# Patient Record
Sex: Female | Born: 1950 | Race: Black or African American | Hispanic: No | Marital: Married | Smoking: Never smoker
Health system: Southern US, Community
[De-identification: ages and names within clinical notes are randomized; demographics above are authoritative.]

## PROBLEM LIST (undated history)

## (undated) DIAGNOSIS — F32A Depression, unspecified: Secondary | ICD-10-CM

## (undated) DIAGNOSIS — K219 Gastro-esophageal reflux disease without esophagitis: Secondary | ICD-10-CM

## (undated) DIAGNOSIS — M1990 Unspecified osteoarthritis, unspecified site: Secondary | ICD-10-CM

## (undated) DIAGNOSIS — F419 Anxiety disorder, unspecified: Secondary | ICD-10-CM

## (undated) DIAGNOSIS — R519 Headache, unspecified: Secondary | ICD-10-CM

## (undated) DIAGNOSIS — H409 Unspecified glaucoma: Secondary | ICD-10-CM

## (undated) DIAGNOSIS — I10 Essential (primary) hypertension: Secondary | ICD-10-CM

## (undated) DIAGNOSIS — F329 Major depressive disorder, single episode, unspecified: Secondary | ICD-10-CM

## (undated) DIAGNOSIS — D649 Anemia, unspecified: Secondary | ICD-10-CM

## (undated) DIAGNOSIS — R51 Headache: Secondary | ICD-10-CM

## (undated) HISTORY — DX: Essential (primary) hypertension: I10

## (undated) HISTORY — PX: ROTATOR CUFF REPAIR: SHX139

---

## 1989-07-17 HISTORY — PX: CARPAL TUNNEL RELEASE: SHX101

## 1999-09-11 ENCOUNTER — Other Ambulatory Visit: Admission: RE | Admit: 1999-09-11 | Discharge: 1999-09-11 | Payer: Self-pay | Admitting: *Deleted

## 2000-01-01 ENCOUNTER — Ambulatory Visit (HOSPITAL_COMMUNITY): Admission: RE | Admit: 2000-01-01 | Discharge: 2000-01-01 | Payer: Self-pay

## 2000-11-11 ENCOUNTER — Other Ambulatory Visit: Admission: RE | Admit: 2000-11-11 | Discharge: 2000-11-11 | Payer: Self-pay | Admitting: *Deleted

## 2001-12-29 ENCOUNTER — Other Ambulatory Visit: Admission: RE | Admit: 2001-12-29 | Discharge: 2001-12-29 | Payer: Self-pay | Admitting: *Deleted

## 2003-01-01 ENCOUNTER — Other Ambulatory Visit: Admission: RE | Admit: 2003-01-01 | Discharge: 2003-01-01 | Payer: Self-pay | Admitting: *Deleted

## 2003-07-12 ENCOUNTER — Encounter (INDEPENDENT_AMBULATORY_CARE_PROVIDER_SITE_OTHER): Payer: Self-pay | Admitting: Specialist

## 2003-07-12 ENCOUNTER — Ambulatory Visit (HOSPITAL_COMMUNITY): Admission: RE | Admit: 2003-07-12 | Discharge: 2003-07-12 | Payer: Self-pay | Admitting: *Deleted

## 2003-12-28 ENCOUNTER — Encounter: Admission: RE | Admit: 2003-12-28 | Discharge: 2003-12-28 | Payer: Self-pay | Admitting: Endocrinology

## 2004-01-20 ENCOUNTER — Encounter: Admission: RE | Admit: 2004-01-20 | Discharge: 2004-01-20 | Payer: Self-pay | Admitting: Internal Medicine

## 2004-01-23 ENCOUNTER — Other Ambulatory Visit: Admission: RE | Admit: 2004-01-23 | Discharge: 2004-01-23 | Payer: Self-pay | Admitting: *Deleted

## 2004-03-23 ENCOUNTER — Emergency Department (HOSPITAL_COMMUNITY): Admission: EM | Admit: 2004-03-23 | Discharge: 2004-03-23 | Payer: Self-pay | Admitting: Emergency Medicine

## 2004-05-20 ENCOUNTER — Encounter: Admission: RE | Admit: 2004-05-20 | Discharge: 2004-05-20 | Payer: Self-pay

## 2004-06-06 ENCOUNTER — Encounter: Admission: RE | Admit: 2004-06-06 | Discharge: 2004-06-06 | Payer: Self-pay

## 2004-06-19 ENCOUNTER — Encounter: Admission: RE | Admit: 2004-06-19 | Discharge: 2004-06-19 | Payer: Self-pay

## 2004-10-27 ENCOUNTER — Encounter (INDEPENDENT_AMBULATORY_CARE_PROVIDER_SITE_OTHER): Payer: Self-pay | Admitting: *Deleted

## 2004-10-27 ENCOUNTER — Ambulatory Visit (HOSPITAL_BASED_OUTPATIENT_CLINIC_OR_DEPARTMENT_OTHER): Admission: RE | Admit: 2004-10-27 | Discharge: 2004-10-27 | Payer: Self-pay | Admitting: General Surgery

## 2004-10-27 ENCOUNTER — Ambulatory Visit (HOSPITAL_COMMUNITY): Admission: RE | Admit: 2004-10-27 | Discharge: 2004-10-27 | Payer: Self-pay | Admitting: General Surgery

## 2005-02-18 ENCOUNTER — Other Ambulatory Visit: Admission: RE | Admit: 2005-02-18 | Discharge: 2005-02-18 | Payer: Self-pay | Admitting: *Deleted

## 2006-06-23 ENCOUNTER — Other Ambulatory Visit: Admission: RE | Admit: 2006-06-23 | Discharge: 2006-06-23 | Payer: Self-pay | Admitting: *Deleted

## 2007-07-11 ENCOUNTER — Other Ambulatory Visit: Admission: RE | Admit: 2007-07-11 | Discharge: 2007-07-11 | Payer: Self-pay | Admitting: *Deleted

## 2008-08-30 ENCOUNTER — Other Ambulatory Visit: Admission: RE | Admit: 2008-08-30 | Discharge: 2008-08-30 | Payer: Self-pay | Admitting: Gynecology

## 2008-11-04 ENCOUNTER — Encounter: Admission: RE | Admit: 2008-11-04 | Discharge: 2008-11-04 | Payer: Self-pay | Admitting: Internal Medicine

## 2008-11-13 ENCOUNTER — Encounter: Admission: RE | Admit: 2008-11-13 | Discharge: 2008-11-13 | Payer: Self-pay | Admitting: Internal Medicine

## 2009-01-02 ENCOUNTER — Encounter: Admission: RE | Admit: 2009-01-02 | Discharge: 2009-02-20 | Payer: Self-pay | Admitting: Rheumatology

## 2010-02-07 ENCOUNTER — Encounter: Admission: RE | Admit: 2010-02-07 | Discharge: 2010-02-07 | Payer: Self-pay | Admitting: Internal Medicine

## 2010-04-23 ENCOUNTER — Encounter: Admission: RE | Admit: 2010-04-23 | Discharge: 2010-04-23 | Payer: Self-pay | Admitting: Internal Medicine

## 2010-07-07 ENCOUNTER — Encounter: Admission: RE | Admit: 2010-07-07 | Discharge: 2010-07-07 | Payer: Self-pay | Admitting: Internal Medicine

## 2010-12-07 ENCOUNTER — Encounter: Payer: Self-pay | Admitting: Internal Medicine

## 2011-04-03 NOTE — Op Note (Signed)
   NAME:  Alyssa Valentine, Alyssa Valentine                         ACCOUNT NO.:  [id number]   MEDICAL RECORD NO.:  [id number]                   PATIENT TYPE:  AMB   LOCATION:  ENDO                                 FACILITY:  [facility]   PHYSICIAN:  Georgiana Spinner, M.D.                 DATE OF BIRTH:  [birth year]   DATE OF PROCEDURE:  07/12/2003  DATE OF DISCHARGE:                                 OPERATIVE REPORT   PROCEDURE:  Upper endoscopy.   INDICATIONS:  GERD.   ANESTHESIA:  Demerol 50 mg, Versed 7 mg.   DESCRIPTION OF PROCEDURE:  With the patient mildly sedated in the left  lateral decubitus position, the Olympus videoscopic endoscope was inserted  in the mouth, passed under direct vision through the esophagus, which  appeared normal, into the stomach.  The fundus, body, antrum, duodenal bulb,  second portion of the duodenum all appeared normal.  From this point the  endoscope was slowly withdrawn, taking circumferential views of the duodenal  mucosa until the endoscope had been pulled back into the stomach, placed in  retroflexion to view the stomach from below.  The endoscope was straightened  and withdrawn, taking circumferential views of the remaining gastric and  esophageal mucosa.  The patient's vital signs and pulse oximetry remained  stable.  The patient tolerated the procedure well without apparent  complications.   FINDINGS:  Unremarkable examination.   PLAN:  Proceed to colonoscopy.                                               Georgiana Spinner, M.D.    GMO/MEDQ  D:  07/12/2003  T:  07/12/2003  Job:  [id number]

## 2011-04-03 NOTE — Op Note (Signed)
   NAME:  Alyssa Valentine, Alyssa Valentine                         ACCOUNT NO.:  [id number]   MEDICAL RECORD NO.:  [id number]                   PATIENT TYPE:  AMB   LOCATION:  ENDO                                 FACILITY:  [facility]   PHYSICIAN:  Georgiana Spinner, M.D.                 DATE OF BIRTH:  October 25, 1951   DATE OF PROCEDURE:  07/12/2003  DATE OF DISCHARGE:                                 OPERATIVE REPORT   PROCEDURE:  Colonoscopy with biopsy and polypectomy.   INDICATIONS:  Colon polyp.   ANESTHESIA:  Demerol 10 mg.   DESCRIPTION OF PROCEDURE:  With the patient mildly sedated in the left  lateral decubitus position, the Olympus videoscopic colonoscope was inserted  in the rectum, passed under direct vision to the cecum, identified by the  ileocecal valve and appendiceal orifice, both of which were photographed.  From this point the colonoscope was slowly withdrawn, taking circumferential  views of the colonic mucosa, stopping in the descending colon near the  splenic flexure, where a small polyp was seen and removed using hot biopsy  forceps technique, a setting of 20-20 blended current, and subsequently at  approximately 20 cm from the anal verge, a large polyp seen on a stalk was  noted.  It was photographed and it was removed using snare cautery technique  by encircling the stalk.  There was good hemostasis, and the polyp was  removed and obtained for pathology.  The endoscope was reinserted to this  level and slowly withdrawn taking circumferential views of the remaining  colonic mucosa, stopping in the rectum, which appeared normal on direct and  showed hemorrhoids on retroflexed view.  The endoscope was straightened and  withdrawn.  The patient's vital signs and pulse oximetry remained stable.  The patient tolerated the procedure well without apparent complication.   FINDINGS:  Polyps as described above in the splenic flexure and sigmoid  colon.   Await biopsy report.  The patient will  call me for results and follow up  with me as an outpatient.                                               Georgiana Spinner, M.D.    GMO/MEDQ  D:  07/12/2003  T:  07/12/2003  Job:  [id number]

## 2011-04-03 NOTE — Op Note (Signed)
[name], Alyssa Valentine               ACCOUNT NO.:  [id number]   MEDICAL RECORD NO.:  [id number]          PATIENT TYPE:  AMB   LOCATION:  DSC                          FACILITY:  [facility]   PHYSICIAN:  Angelia Mould. Derrell Lolling, M.D.DATE OF BIRTH:  July 12, 1951   DATE OF PROCEDURE:  10/27/2004  DATE OF DISCHARGE:                                 OPERATIVE REPORT   PREOPERATIVE DIAGNOSIS:  Muscle pain and myositis.   OPERATION PERFORMED:  Left thigh muscle biopsy.   SURGEON:  Angelia Mould. Derrell Lolling, M.D.   OPERATIVE INDICATION:  This is a 60 year old black female who has a 1-year  history of back pain and progressive leg pain, but minimal weakness.  She  has been evaluated by Dr. Lacretia Nicks. Danella Maiers.  Elevated CPK was noted.  Dr. Phylliss Bob  requested a thigh muscle biopsy.   OPERATIVE TECHNIQUE:  The patient was brought to [facility] and  brought to a regular operating room.  She was monitored and sedated by the  anesthesia department.  She was placed supine on the operating table.  The  left upper thigh was prepped and draped in a sterile fashion.  Xylocaine 1%  with epinephrine was used as a local infiltration anesthetic.  A vertically  oriented, slightly oblique incision was made in the upper anterolateral  thigh.  Dissection was carried down through the subcutaneous tissue.  The  deep muscle fascia was incised.  About a 1-1/2- to 2-inch segment of thigh  muscle, presumably the quadriceps muscle, was isolated, clamped proximally  and distally, and amputated.  This was fixed onto a tongue blade with  needles and sent fresh to the lab.  Requests were made for routine histology  as well as electron microscopy studies at [facility].   The cut ends of the muscle were ligated with 2-0 Vicryl ties.  There was no  bleeding.  Subcutaneous tissue was closed with interrupted sutures of 3-0  Vicryl and the skin closed with a running subcuticular suture of 4-0 Vicryl  and Steri-Strips.  Clean bandages and the  patient taken to recovery room in  stable condition.   ESTIMATED BLOOD LOSS:  Estimated blood loss was about 5 mL.   COMPLICATIONS:  None.    Sponge and instruments were correct.      Hayw   HMI/MEDQ  D:  10/27/2004  T:  10/27/2004  Job:  [id number]   cc:   Areatha Keas, M.D.  [address]  [address]  [place]  [place] [address]  Fax: [phone]

## 2011-09-24 ENCOUNTER — Other Ambulatory Visit: Payer: Self-pay | Admitting: Internal Medicine

## 2011-09-24 DIAGNOSIS — R519 Headache, unspecified: Secondary | ICD-10-CM

## 2011-10-01 ENCOUNTER — Ambulatory Visit
Admission: RE | Admit: 2011-10-01 | Discharge: 2011-10-01 | Disposition: A | Discharge status: Home / Self Care | Payer: Federal, State, Local not specified - PPO | Source: Ambulatory Visit | Attending: Internal Medicine | Admitting: Internal Medicine

## 2011-10-01 DIAGNOSIS — R519 Headache, unspecified: Secondary | ICD-10-CM

## 2012-02-24 ENCOUNTER — Ambulatory Visit (INDEPENDENT_AMBULATORY_CARE_PROVIDER_SITE_OTHER): Payer: Federal, State, Local not specified - PPO | Admitting: Physician Assistant

## 2012-02-24 VITALS — BP 117/76 | HR 94 | Temp 97.8°F | Resp 16 | Ht 60.5 in | Wt 157.8 lb

## 2012-02-24 DIAGNOSIS — Z111 Encounter for screening for respiratory tuberculosis: Secondary | ICD-10-CM

## 2012-02-24 DIAGNOSIS — I10 Essential (primary) hypertension: Secondary | ICD-10-CM

## 2012-02-24 NOTE — Progress Notes (Signed)
   Patient ID: Alyssa Valentine MRN: [id number], DOB: [birth year], 61 y.o. Date of Encounter: 02/24/2012, 4:38 PM  Primary Physician: No primary provider on file.  Chief Complaint: Needs PPD  HPI: 61 y.o. year old female with history below presents for PPD. Needs PPD for new job. 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 Asymptomatic. She is otherwise doing well without issues or complaints.   Past Medical History  Diagnosis Date  . HTN (hypertension)      Home Meds: Prior to Admission medications   Medication Sig Start Date End Date Taking? Authorizing Provider  aspirin 81 MG tablet Take 81 mg by mouth daily.   Yes Historical Provider, MD  Benazepril HCl (LOTENSIN PO) Take by mouth daily.   Yes Historical Provider, MD  CALCIUM-VITAMIN D PO Take by mouth daily.   Yes Historical Provider, MD  DEXLANSOPRAZOLE PO Take by mouth daily.   Yes Historical Provider, MD  Fish Oil OIL by Does not apply route daily.   Yes Historical Provider, MD  Potassium (POTASSIMIN PO) Take by mouth.   Yes Historical Provider, MD    Allergies:  Allergies  Allergen Reactions  . Sulfa Antibiotics Swelling    ITCHING    History   Social History  . Marital Status: Married    Spouse Name: N/A    Number of Children: N/A  . Years of Education: N/A   Occupational History  . Not on file.   Social History Main Topics  . Smoking status: Never Smoker   . Smokeless tobacco: Not on file  . Alcohol Use: No  . Drug Use: Not on file  . Sexually Active: Not on file   Other Topics Concern  . Not on file   Social History Narrative  . No narrative on file     Review of Systems: Constitutional: negative for chills, fever, night sweats, weight changes, or fatigue  HEENT: negative for vision changes, hearing loss, congestion, rhinorrhea, ST, epistaxis, or sinus pressure Cardiovascular: negative for chest pain or  palpitations Respiratory: negative for hemoptysis, wheezing, shortness of breath, or cough Abdominal: negative for abdominal pain, nausea, vomiting, diarrhea, or constipation Dermatological: negative for rash Neurologic: negative for headache, dizziness, or syncope All other systems reviewed and are otherwise negative with the exception to those above and in the HPI.   Physical Exam: Blood pressure 117/76, pulse 94, temperature 97.8 F (36.6 C), temperature source Oral, resp. rate 16, height 5' 0.5" (1.537 m), weight 157 lb 12.8 oz (71.578 kg)., Body mass index is 30.31 kg/(m^2). General: Well developed, well nourished, in no acute distress. Head: Normocephalic, atraumatic, eyes without discharge, sclera non-icteric, nares are without discharge.  Neck: Supple. No thyromegaly. Full ROM. No lymphadenopathy. Lungs: Clear bilaterally to auscultation without wheezes, rales, or rhonchi. Breathing is unlabored. Heart: RRR with S1 S2. No murmurs, rubs, or gallops appreciated. Msk:  Strength and tone normal for age. Extremities/Skin: Warm and dry. No clubbing or cyanosis. No edema. No rashes or suspicious lesions. Neuro: Alert and oriented X 3. Moves all extremities spontaneously. Gait is normal. CNII-XII grossly in tact. Psych:  Responds to questions appropriately with a normal affect.     ASSESSMENT AND PLAN:  61 y.o. year old female here for PPD -PPD placed -RTC 48-72 hours for reading  Signed, Eula Listen, PA-C 02/24/2012 4:38 PM

## 2012-04-10 LAB — TB SKIN TEST

## 2012-08-18 ENCOUNTER — Other Ambulatory Visit: Payer: Self-pay | Admitting: Internal Medicine

## 2012-08-18 DIAGNOSIS — M545 Low back pain, unspecified: Secondary | ICD-10-CM

## 2012-08-22 ENCOUNTER — Inpatient Hospital Stay: Admission: RE | Admit: 2012-08-22 | Payer: Federal, State, Local not specified - PPO | Source: Ambulatory Visit

## 2012-08-29 ENCOUNTER — Ambulatory Visit
Admission: RE | Admit: 2012-08-29 | Discharge: 2012-08-29 | Disposition: A | Discharge status: Home / Self Care | Payer: Federal, State, Local not specified - PPO | Source: Ambulatory Visit | Attending: Internal Medicine | Admitting: Internal Medicine

## 2012-08-29 DIAGNOSIS — M545 Low back pain, unspecified: Secondary | ICD-10-CM

## 2012-09-09 ENCOUNTER — Other Ambulatory Visit: Payer: Self-pay | Admitting: Internal Medicine

## 2012-09-09 DIAGNOSIS — M549 Dorsalgia, unspecified: Secondary | ICD-10-CM

## 2012-09-15 ENCOUNTER — Ambulatory Visit
Admission: RE | Admit: 2012-09-15 | Discharge: 2012-09-15 | Disposition: A | Discharge status: Home / Self Care | Payer: Federal, State, Local not specified - PPO | Source: Ambulatory Visit | Attending: Internal Medicine | Admitting: Internal Medicine

## 2012-09-15 DIAGNOSIS — M549 Dorsalgia, unspecified: Secondary | ICD-10-CM

## 2012-09-15 MED ORDER — IOHEXOL 180 MG/ML  SOLN
1.0000 mL | Freq: Once | INTRAMUSCULAR | Status: AC | PRN
  Administered 2012-09-15: 1 mL via EPIDURAL

## 2012-09-15 MED ORDER — METHYLPREDNISOLONE ACETATE 40 MG/ML INJ SUSP (RADIOLOG
120.0000 mg | Freq: Once | INTRAMUSCULAR | Status: AC
  Administered 2012-09-15: 120 mg via EPIDURAL

## 2013-03-17 ENCOUNTER — Other Ambulatory Visit: Payer: Self-pay | Admitting: Internal Medicine

## 2013-03-17 DIAGNOSIS — M549 Dorsalgia, unspecified: Secondary | ICD-10-CM

## 2013-03-21 ENCOUNTER — Ambulatory Visit
Admission: RE | Admit: 2013-03-21 | Discharge: 2013-03-21 | Disposition: A | Discharge status: Home / Self Care | Payer: Federal, State, Local not specified - PPO | Source: Ambulatory Visit | Attending: Internal Medicine | Admitting: Internal Medicine

## 2013-03-21 DIAGNOSIS — M549 Dorsalgia, unspecified: Secondary | ICD-10-CM

## 2013-03-21 MED ORDER — METHYLPREDNISOLONE ACETATE 40 MG/ML INJ SUSP (RADIOLOG
120.0000 mg | Freq: Once | INTRAMUSCULAR | Status: AC
  Administered 2013-03-21: 120 mg via EPIDURAL

## 2013-03-21 MED ORDER — IOHEXOL 180 MG/ML  SOLN
1.0000 mL | Freq: Once | INTRAMUSCULAR | Status: AC | PRN
  Administered 2013-03-21: 1 mL via EPIDURAL

## 2013-05-27 IMAGING — CT CT HEAD W/O CM
2 series · 16 of 30 positions shown, 20 images · non-contrast
Comparison: None.

CLINICAL DATA: 60-year-old female with headache, memory loss,
dizziness, confusion, speech abnormality.

CT HEAD WITHOUT CONTRAST
TECHNIQUE: Contiguous axial images were obtained from the base of
the skull through the vertex without contrast.

[Series 2: head w/o · axial · non-contrast · 0.43mm/px · z∈[+12,+136]mm · 13 of 28 slices shown, 17 images]
[im 2/28  brain]
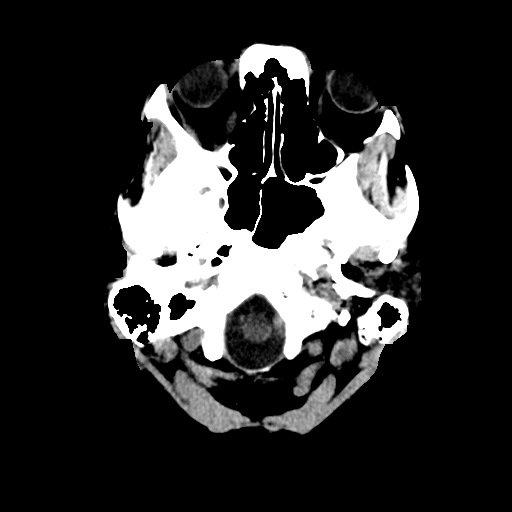
[im 2/28  bone]
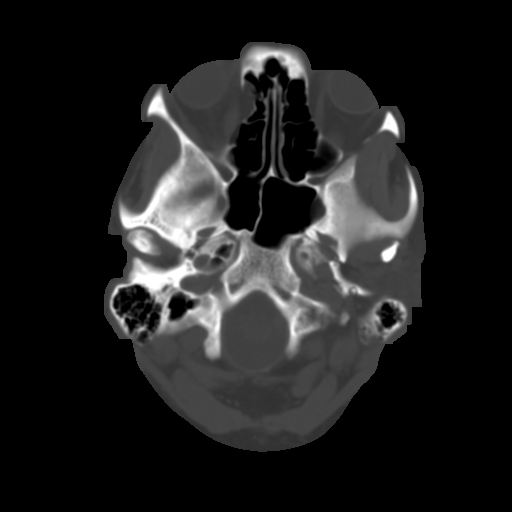
[im 4/28  brain]
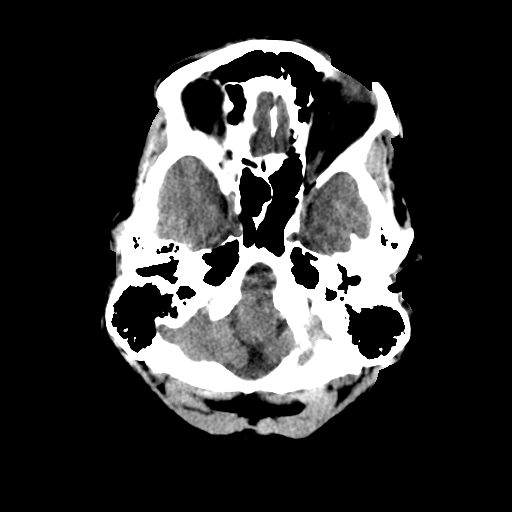
[im 6/28  brain]
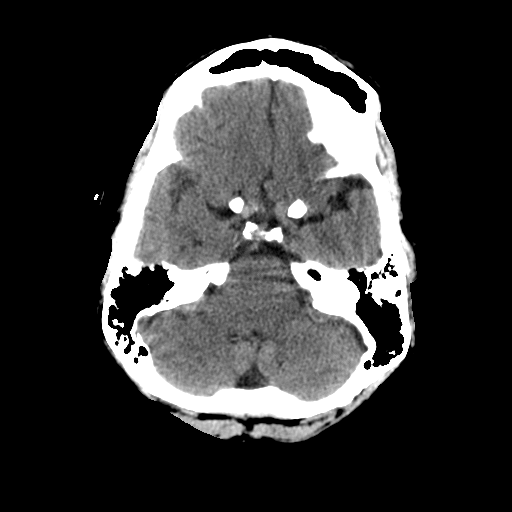
[im 8/28  brain]
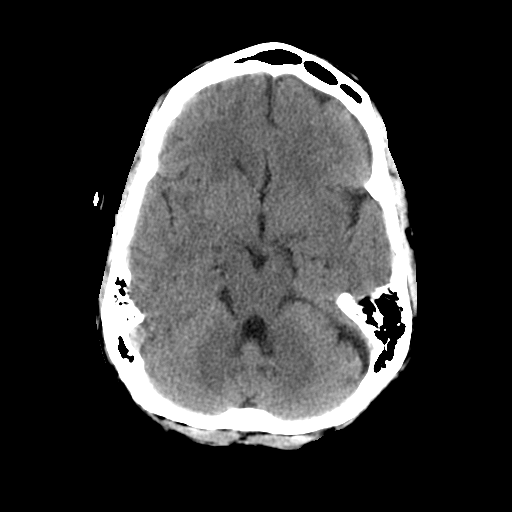
[im 10/28  brain]
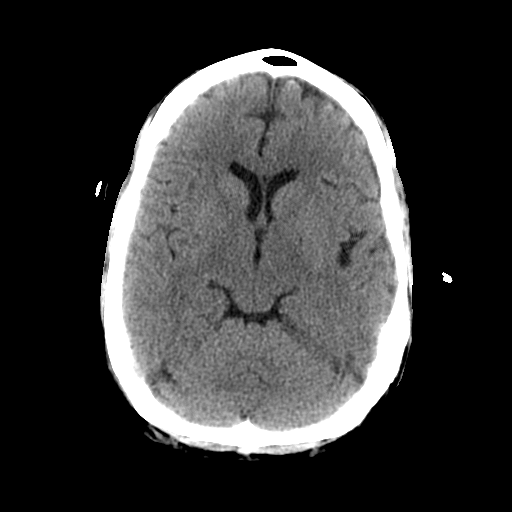
[im 10/28  bone]
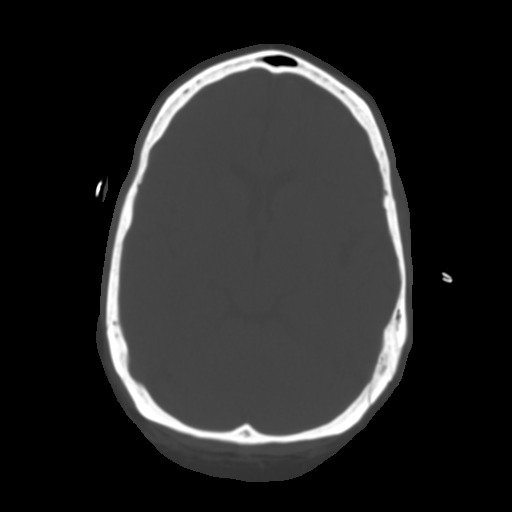
[im 12/28  brain]
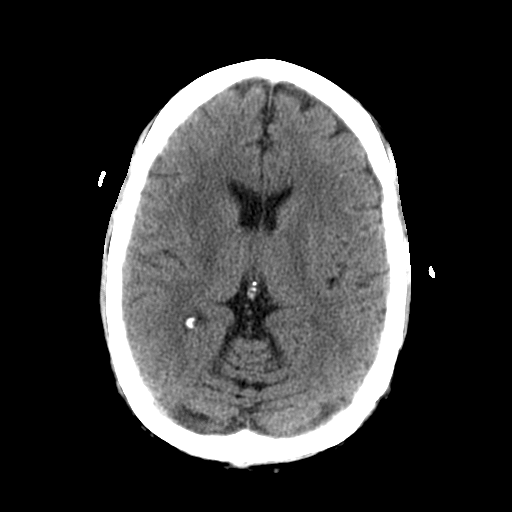
[im 14/28  brain]
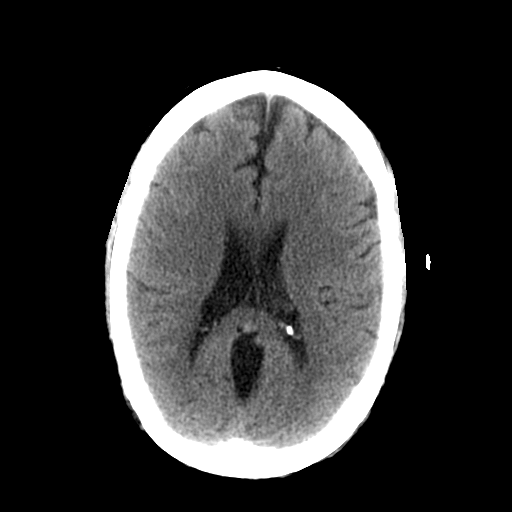
[im 16/28  brain]
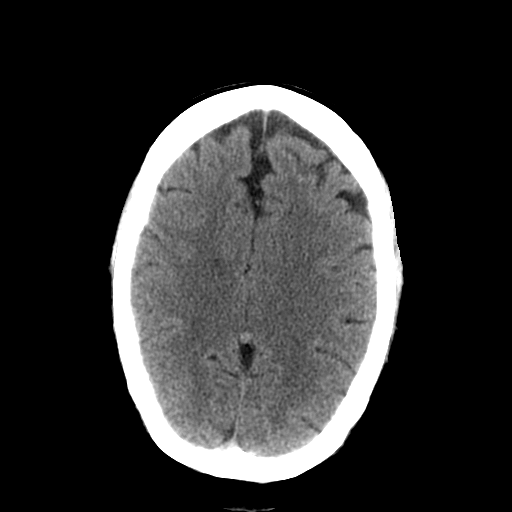
[im 18/28  brain]
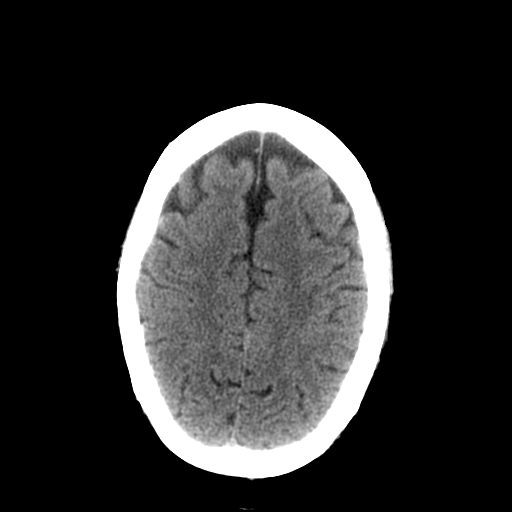
[im 18/28  bone]
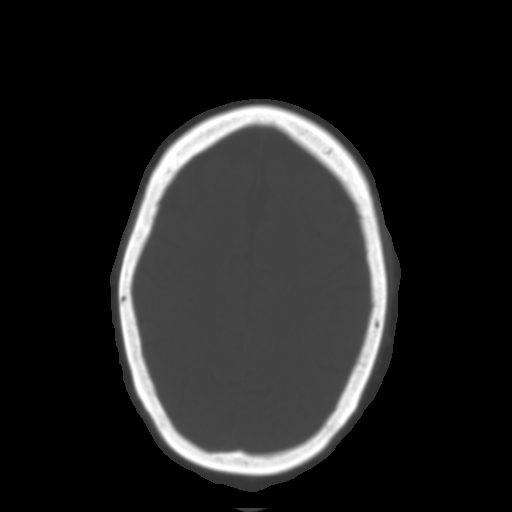
[im 20/28  brain]
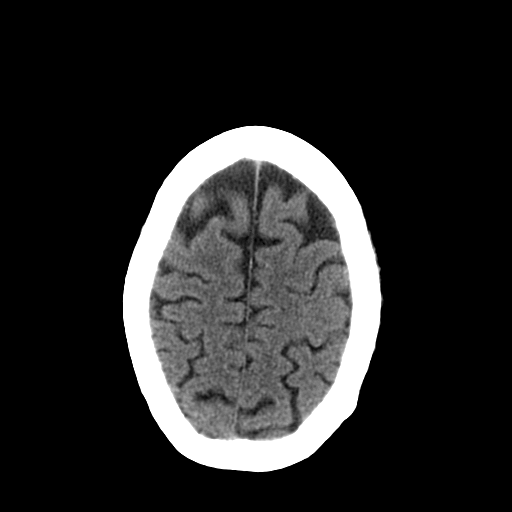
[im 22/28  brain]
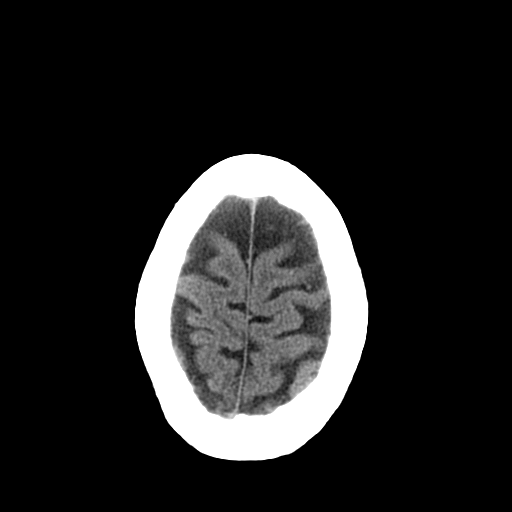
[im 24/28  brain]
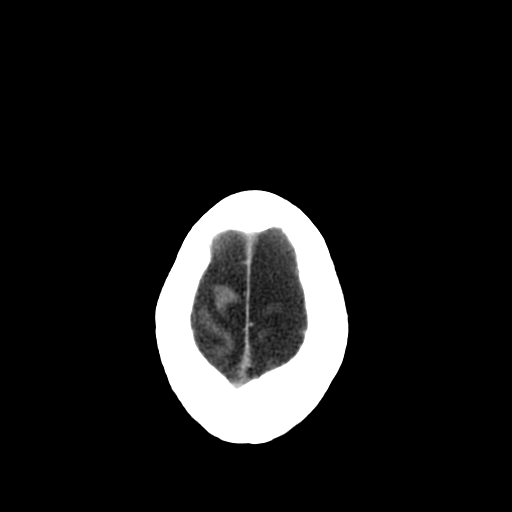
[im 26/28  brain]
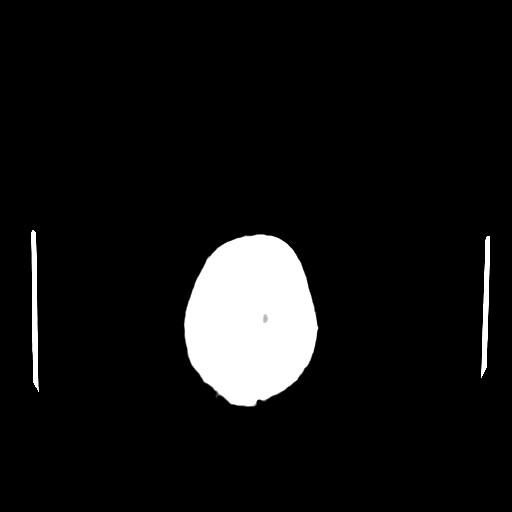
[im 26/28  bone]
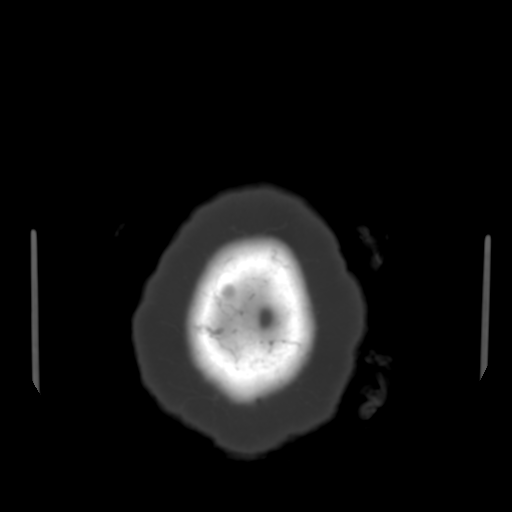

[Series 3: head bone · axial · 0.43mm/px · z∈[+12,+54]mm · 3 of 28 slices shown]
[im 2/28  bone]
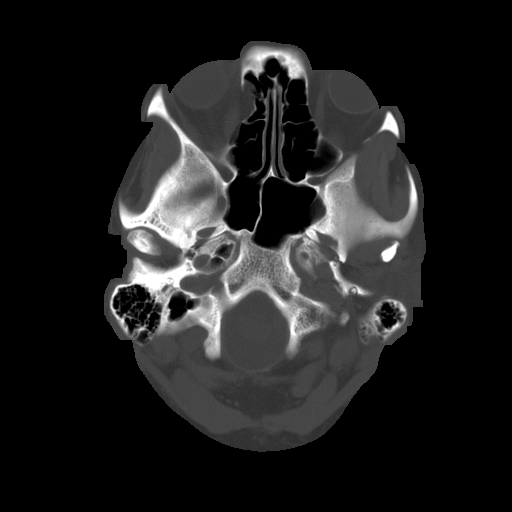
[im 6/28  bone]
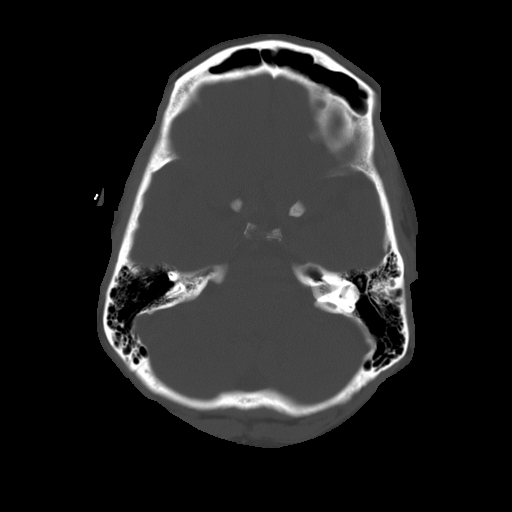
[im 10/28  bone]
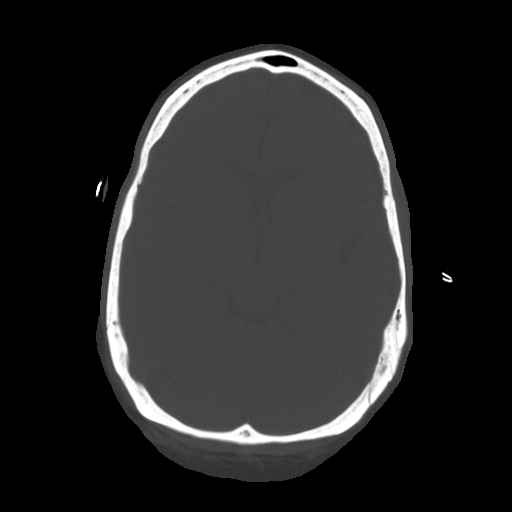

[16 of 30 positions shown; findings below may reference images not displayed]

FINDINGS: Visualized orbit soft tissues are within normal limits.
Visualized scalp soft tissues are within normal limits.  Visualized
paranasal sinuses and mastoids are clear.  No acute osseous
abnormality identified.

Occasional subtle cerebral white matter hypodensity. Cerebral
volume is within normal limits for age.  No midline shift,
ventriculomegaly, mass effect, evidence of mass lesion,
intracranial hemorrhage or evidence of cortically based acute
infarction. No suspicious intracranial vascular hyperdensity.
IMPRESSION: Negative for age noncontrast CT appearance of the brain.

## 2013-08-30 ENCOUNTER — Other Ambulatory Visit: Payer: Self-pay | Admitting: Gastroenterology

## 2013-09-04 ENCOUNTER — Encounter (HOSPITAL_COMMUNITY): Payer: Self-pay | Admitting: *Deleted

## 2013-09-05 ENCOUNTER — Encounter (HOSPITAL_COMMUNITY): Payer: Self-pay | Admitting: Pharmacy Technician

## 2013-09-07 ENCOUNTER — Encounter (HOSPITAL_COMMUNITY): Payer: Self-pay | Admitting: Pharmacy Technician

## 2013-09-15 ENCOUNTER — Encounter (HOSPITAL_COMMUNITY): Payer: Self-pay | Admitting: *Deleted

## 2013-09-15 ENCOUNTER — Encounter (HOSPITAL_COMMUNITY): Payer: Federal, State, Local not specified - PPO | Admitting: Anesthesiology

## 2013-09-15 ENCOUNTER — Encounter (HOSPITAL_COMMUNITY)
Admission: RE | Disposition: A | Discharge status: Home / Self Care | Payer: Self-pay | Source: Ambulatory Visit | Attending: Gastroenterology

## 2013-09-15 ENCOUNTER — Ambulatory Visit (HOSPITAL_COMMUNITY): Payer: Federal, State, Local not specified - PPO | Admitting: Anesthesiology

## 2013-09-15 ENCOUNTER — Ambulatory Visit (HOSPITAL_COMMUNITY)
Admission: RE | Admit: 2013-09-15 | Discharge: 2013-09-15 | Disposition: A | Discharge status: Home / Self Care | Payer: Federal, State, Local not specified - PPO | Source: Ambulatory Visit | Attending: Gastroenterology | Admitting: Gastroenterology

## 2013-09-15 DIAGNOSIS — D509 Iron deficiency anemia, unspecified: Secondary | ICD-10-CM | POA: Insufficient documentation

## 2013-09-15 DIAGNOSIS — K2970 Gastritis, unspecified, without bleeding: Secondary | ICD-10-CM | POA: Insufficient documentation

## 2013-09-15 DIAGNOSIS — K5730 Diverticulosis of large intestine without perforation or abscess without bleeding: Secondary | ICD-10-CM | POA: Insufficient documentation

## 2013-09-15 DIAGNOSIS — K219 Gastro-esophageal reflux disease without esophagitis: Secondary | ICD-10-CM | POA: Insufficient documentation

## 2013-09-15 DIAGNOSIS — K259 Gastric ulcer, unspecified as acute or chronic, without hemorrhage or perforation: Secondary | ICD-10-CM | POA: Insufficient documentation

## 2013-09-15 DIAGNOSIS — K644 Residual hemorrhoidal skin tags: Secondary | ICD-10-CM | POA: Insufficient documentation

## 2013-09-15 DIAGNOSIS — K449 Diaphragmatic hernia without obstruction or gangrene: Secondary | ICD-10-CM | POA: Insufficient documentation

## 2013-09-15 DIAGNOSIS — K648 Other hemorrhoids: Secondary | ICD-10-CM | POA: Insufficient documentation

## 2013-09-15 DIAGNOSIS — I10 Essential (primary) hypertension: Secondary | ICD-10-CM | POA: Insufficient documentation

## 2013-09-15 DIAGNOSIS — D126 Benign neoplasm of colon, unspecified: Secondary | ICD-10-CM | POA: Insufficient documentation

## 2013-09-15 HISTORY — PX: ESOPHAGOGASTRODUODENOSCOPY: SHX5428

## 2013-09-15 HISTORY — DX: Gastro-esophageal reflux disease without esophagitis: K21.9

## 2013-09-15 HISTORY — PX: COLONOSCOPY: SHX5424

## 2013-09-15 SURGERY — EGD (ESOPHAGOGASTRODUODENOSCOPY)
Anesthesia: Monitor Anesthesia Care

## 2013-09-15 MED ORDER — PROPOFOL INFUSION 10 MG/ML OPTIME
INTRAVENOUS | Status: DC | PRN
  Administered 2013-09-15: 300 ug/kg/min via INTRAVENOUS

## 2013-09-15 MED ORDER — LACTATED RINGERS IV SOLN
INTRAVENOUS | Status: DC
  Administered 2013-09-15: 1000 mL via INTRAVENOUS

## 2013-09-15 MED ORDER — SODIUM CHLORIDE 0.9 % IV SOLN: INTRAVENOUS | Status: DC

## 2013-09-15 NOTE — H&P (Signed)
  Catalina Lunger HPI: On routine testing the patient was identified to have an iron deficiency anemia.  Her HGB was noted to be in the 8 range.  She denies any issues with hematochezia, melena, diarrhea, or constpation.  The patient reports issues with GERD, but no history of ulcers.  Currently she uses her husband's omeprazole.  Her last colonoscopy was in 2004 by Dr. Virginia Rochester with findings of a small adenoma at 20 cm.  There is no known family history of colon cancer.  Past Medical History  Diagnosis Date  . HTN (hypertension)   . GERD (gastroesophageal reflux disease)     Past Surgical History  Procedure Laterality Date  . Cesarean section  1985  . Rotator cuff repair Bilateral 2010 and 2013  . Carpal tunnel release Right 1990's    History reviewed. No pertinent family history.  Social History:  reports that she has never smoked. She has never used smokeless tobacco. She reports that she drinks alcohol. She reports that she does not use illicit drugs.  Allergies:  Allergies  Allergen Reactions  . Sulfa Antibiotics Itching and Swelling    Medications:  Scheduled:  Continuous: . sodium chloride    . lactated ringers 1,000 mL (09/15/13 1019)    No results found for this or any previous visit (from the past 24 hour(s)).   No results found.  ROS:  As stated above in the HPI otherwise negative.  Blood pressure 142/83, temperature 98 F (36.7 C), temperature source Oral, resp. rate 16, height 5\' 1"  (1.549 m), weight 148 lb (67.132 kg), SpO2 98.00%.    PE: Gen: NAD, Alert and Oriented HEENT:  [place]/AT, EOMI Neck: Supple, no LAD Lungs: CTA Bilaterally CV: RRR without M/G/R ABM: Soft, NTND, +BS Ext: No C/C/E  Assessment/Plan: 1) IDA.  Plan: 1) EGD/Colonoscopy.  Esraa Seres D 09/15/2013, 10:56 AM

## 2013-09-15 NOTE — Anesthesia Postprocedure Evaluation (Signed)
  Anesthesia Post-op Note  Patient: Alyssa Valentine  Procedure(s) Performed: Procedure(s) (LRB): ESOPHAGOGASTRODUODENOSCOPY (EGD) (N/A) COLONOSCOPY (N/A)  Patient Location: PACU  Anesthesia Type: MAC  Level of Consciousness: awake and alert   Airway and Oxygen Therapy: Patient Spontanous Breathing  Post-op Pain: mild  Post-op Assessment: Post-op Vital signs reviewed, Patient's Cardiovascular Status Stable, Respiratory Function Stable, Patent Airway and No signs of Nausea or vomiting  Last Vitals:  Filed Vitals:   09/15/13 1214  BP: 115/81  Pulse: 76  Temp:   Resp: 16    Post-op Vital Signs: stable   Complications: No apparent anesthesia complications

## 2013-09-15 NOTE — Transfer of Care (Signed)
Immediate Anesthesia Transfer of Care Note  Patient: Alyssa Valentine  Procedure(s) Performed: Procedure(s): ESOPHAGOGASTRODUODENOSCOPY (EGD) (N/A) COLONOSCOPY (N/A)  Patient Location: PACU and Endoscopy Unit  Anesthesia Type:MAC  Level of Consciousness: awake, alert , oriented and patient cooperative  Airway & Oxygen Therapy: Patient Spontanous Breathing and Patient connected to nasal cannula oxygen  Post-op Assessment: Report given to PACU RN and Post -op Vital signs reviewed and stable  Post vital signs: Reviewed and stable  Complications: No apparent anesthesia complications

## 2013-09-15 NOTE — Anesthesia Preprocedure Evaluation (Signed)
Anesthesia Evaluation  Patient identified by MRN, date of birth, ID band Patient awake    Reviewed: Allergy & Precautions, H&P , NPO status , Patient's Chart, lab work & pertinent test results  Airway Mallampati: II TM Distance: >3 FB Neck ROM: Full    Dental no notable dental hx.    Pulmonary neg pulmonary ROS,  breath sounds clear to auscultation  Pulmonary exam normal       Cardiovascular hypertension, Pt. on medications negative cardio ROS  Rhythm:Regular Rate:Normal     Neuro/Psych negative neurological ROS  negative psych ROS   GI/Hepatic Neg liver ROS, GERD-  Medicated,  Endo/Other  negative endocrine ROS  Renal/GU negative Renal ROS  negative genitourinary   Musculoskeletal negative musculoskeletal ROS (+)   Abdominal   Peds negative pediatric ROS (+)  Hematology negative hematology ROS (+)   Anesthesia Other Findings   Reproductive/Obstetrics negative OB ROS                           Anesthesia Physical Anesthesia Plan  ASA: II  Anesthesia Plan: MAC   Post-op Pain Management:    Induction: Intravenous  Airway Management Planned:   Additional Equipment:   Intra-op Plan:   Post-operative Plan:   Informed Consent: I have reviewed the patients History and Physical, chart, labs and discussed the procedure including the risks, benefits and alternatives for the proposed anesthesia with the patient or authorized representative who has indicated his/her understanding and acceptance.   Dental advisory given  Plan Discussed with: CRNA  Anesthesia Plan Comments:         Anesthesia Quick Evaluation

## 2013-09-15 NOTE — Op Note (Signed)
[facility] [address] [place], [address]   OPERATIVE PROCEDURE REPORT  PATIENT: Alyssa Valentine, Alyssa Valentine  MR#: [id number] BIRTHDATE: 1951/10/02  GENDER: Female ENDOSCOPIST: Jeani Hawking, MD ASSISTANT:   Nada Libman, RN Anthony Sar, RN Windell Hummingbird, technician PROCEDURE DATE: 09/15/2013 PROCEDURE:   Colonoscopy with snare polypectomy ASA CLASS:   Class III INDICATIONS:IDA and personal history of polyps. MEDICATIONS: MAC sedation, administered by CRNA  DESCRIPTION OF PROCEDURE:   After the risks benefits and alternatives of the procedure were thoroughly explained, informed consent was obtained.  A digital rectal exam revealed no abnormalities of the rectum.    The EC-[id number] ([id number])  endoscope was introduced through the anus  and advanced to the cecum, which was identified by both the appendix and ileocecal valve , No adverse events experienced.    The quality of the prep was excellent. .  The instrument was then slowly withdrawn as the colon was fully examined.     FINDINGS: A 4 mm sessile descending colon polyp was removed with a cold snare.  Scattered sigmoid diverticula were identified.  No evidence of any masses, inflammation, ulcerations, erosions, or vascaular abnormalities.          The scope was then withdrawn from the patient and the procedure terminated.  COMPLICATIONS: There were no complications.  IMPRESSION: 1) Descending colon polyp. 2) Diverticula. 3) Int/Ext hemorrhoids.  RECOMMENDATIONS: 1) Await biopsy results. 2) Repeat the colonoscopy in 5-10 years.  _______________________________ eSignedJeani Hawking, MD 09/15/2013 11:59 AM

## 2013-09-15 NOTE — Op Note (Signed)
[facility] [address] [place], [address]   OPERATIVE PROCEDURE REPORT  PATIENT: Alyssa Valentine, Alyssa Valentine  MR#: [id number] BIRTHDATE: 1951/07/25  GENDER: Female ENDOSCOPIST: Jeani Hawking, MD ASSISTANT:   Nada Libman, RN, Anthony Sar, RN, and Windell Hummingbird, technician PROCEDURE DATE: 09/15/2013 PROCEDURE:   EGD, diagnostic ASA CLASS:   Class III INDICATIONS:Iron Deficiency Anemia. MEDICATIONS: MAC sedation, administered by CRNA TOPICAL ANESTHETIC:   none  DESCRIPTION OF PROCEDURE:   After the risks benefits and alternatives of the procedure were thoroughly explained, informed consent was obtained.  The endoscope [id number]  endoscope was introduced through the mouth  and advanced to the second portion of the duodenum Without limitations.      The instrument was slowly withdrawn as the mucosa was fully examined.      FINDINGS: The esophagus displayed a 3 cm hiatal hernia.  No evidence of esophagitis.  In the gastric antrum a shallow nonbleeding ulcer was identified in the setting of a patchy gasrtitis.  Cold biopsies were obtained.  The duodenum was also normal and cold biopsies were obtained to evaluate for Celiac disease.  The small bowel biopsies were obtained before the identification of the antral ulcer, which was hidden between a fold.          The scope was then withdrawn from the patient and the procedure terminated.  COMPLICATIONS: There were no complications. IMPRESSION: 1) Nonbleeding antral ulcer. 2) Patchy antral gastritis. 3) 3 cm Hiatal hernia.  RECOMMENDATIONS: 1) Await biopsy results. 2) PPI QD. 3) Follow up in the office in two months for repeat blood work.   _______________________________ Rosalie Doctor:  Jeani Hawking, MD 09/15/2013 11:55 AM

## 2013-09-15 NOTE — Preoperative (Signed)
Beta Blockers   Reason not to administer Beta Blockers:Not Applicable 

## 2013-09-18 ENCOUNTER — Encounter (HOSPITAL_COMMUNITY): Payer: Self-pay | Admitting: Gastroenterology

## 2013-10-27 ENCOUNTER — Other Ambulatory Visit: Payer: Self-pay | Admitting: Internal Medicine

## 2013-10-27 DIAGNOSIS — M549 Dorsalgia, unspecified: Secondary | ICD-10-CM

## 2013-10-31 ENCOUNTER — Inpatient Hospital Stay: Admission: RE | Admit: 2013-10-31 | Payer: Federal, State, Local not specified - PPO | Source: Ambulatory Visit

## 2013-11-01 ENCOUNTER — Ambulatory Visit
Admission: RE | Admit: 2013-11-01 | Discharge: 2013-11-01 | Disposition: A | Discharge status: Home / Self Care | Payer: Federal, State, Local not specified - PPO | Source: Ambulatory Visit | Attending: Internal Medicine | Admitting: Internal Medicine

## 2013-11-01 ENCOUNTER — Other Ambulatory Visit: Payer: Federal, State, Local not specified - PPO

## 2013-11-01 DIAGNOSIS — M549 Dorsalgia, unspecified: Secondary | ICD-10-CM

## 2013-11-01 MED ORDER — IOHEXOL 180 MG/ML  SOLN
1.0000 mL | Freq: Once | INTRAMUSCULAR | Status: AC | PRN
  Administered 2013-11-01: 1 mL via EPIDURAL

## 2013-11-01 MED ORDER — METHYLPREDNISOLONE ACETATE 40 MG/ML INJ SUSP (RADIOLOG
120.0000 mg | Freq: Once | INTRAMUSCULAR | Status: AC
  Administered 2013-11-01: 120 mg via EPIDURAL

## 2014-02-23 ENCOUNTER — Other Ambulatory Visit: Payer: Self-pay | Admitting: Emergency Medicine

## 2014-02-23 ENCOUNTER — Ambulatory Visit (INDEPENDENT_AMBULATORY_CARE_PROVIDER_SITE_OTHER): Payer: Federal, State, Local not specified - PPO | Admitting: Emergency Medicine

## 2014-02-23 VITALS — BP 152/90 | HR 83 | Temp 98.7°F | Resp 16 | Ht 61.0 in | Wt 157.0 lb

## 2014-02-23 DIAGNOSIS — Z79899 Other long term (current) drug therapy: Secondary | ICD-10-CM

## 2014-02-23 DIAGNOSIS — B351 Tinea unguium: Secondary | ICD-10-CM

## 2014-02-23 DIAGNOSIS — E785 Hyperlipidemia, unspecified: Secondary | ICD-10-CM

## 2014-02-23 DIAGNOSIS — G4700 Insomnia, unspecified: Secondary | ICD-10-CM

## 2014-02-23 MED ORDER — TERBINAFINE HCL 250 MG PO TABS: 250.0000 mg | ORAL_TABLET | Freq: Every day | ORAL | Status: DC

## 2014-02-23 NOTE — Progress Notes (Signed)
Subjective:     Patient ID: Alyssa Valentine, female   DOB: 1951/10/20, 63 y.o.   MRN: [id number]  HPI  63 YO AA female presents to [facility] with years of "toenail fungus" which has slowly progressed to every toe over the last few year.it is not painful and does not cause her any discomfort. She tried an unknown medication a few years ago, can;t remember if it got better or not but hasn;t tried anything since. She has no other complaint today. Review of Systems  Constitutional: Negative for fever, activity change, appetite change and fatigue.  HENT: Negative for congestion, ear pain and sore throat.   Eyes: Negative for pain and visual disturbance.  Respiratory: Negative for cough, chest tightness, shortness of breath and wheezing.   Cardiovascular: Negative for chest pain, palpitations and leg swelling.  Gastrointestinal: Negative for nausea, vomiting, abdominal pain, diarrhea and blood in stool.  Endocrine: Negative for polyuria.  Genitourinary: Negative for dysuria, urgency and flank pain.  Musculoskeletal: Negative for back pain and myalgias.  Skin: Negative for rash and wound.       Toe nail fungus  Neurological: Negative for dizziness, syncope and headaches.   Prior to Admission medications   Medication Sig Start Date End Date Taking? Authorizing Provider  atorvastatin (LIPITOR) 40 MG tablet Take 40 mg by mouth every evening.   Yes Historical Provider, MD  benazepril-hydrochlorthiazide (LOTENSIN HCT) 10-12.5 MG per tablet Take 1 tablet by mouth every morning.   Yes Historical Provider, MD  Calcium Carbonate-Vit D-Min (CALCIUM 1200 PO) Take 2 tablets by mouth daily.   Yes Historical Provider, MD  Multiple Vitamin (MULTIVITAMIN WITH MINERALS) TABS tablet Take 1 tablet by mouth daily.   Yes Historical Provider, MD  potassium chloride SA (K-DUR,KLOR-CON) 20 MEQ tablet Take 20 mEq by mouth daily.   Yes Historical Provider, MD  zolpidem (AMBIEN) 5 MG tablet Take 5 mg by mouth at bedtime as  needed for sleep.   Yes Historical Provider, MD  Fe Fum-FePoly-FA-Vit C-Vit B3 (INTEGRA F PO) Take 1 capsule by mouth daily.    Historical Provider, MD  oxaprozin (DAYPRO) 600 MG tablet Take 1,200 mg by mouth daily.    Historical Provider, MD       Objective:   Physical Exam  Constitutional: She is oriented to person, place, and time. She appears well-developed and well-nourished. No distress.  HENT:  Head: Normocephalic and atraumatic.  Eyes: Conjunctivae are normal. Pupils are equal, round, and reactive to light.  Neck: Normal range of motion. Neck supple.  Cardiovascular: Normal rate, regular rhythm, normal heart sounds and intact distal pulses.  Exam reveals no gallop and no friction rub.   No murmur heard. Pulmonary/Chest: Effort normal and breath sounds normal. No respiratory distress. She has no wheezes. She has no rales. She exhibits no tenderness.  Abdominal: Soft. Bowel sounds are normal. She exhibits no distension. There is no tenderness. There is no rebound and no guarding.  Musculoskeletal: She exhibits no edema and no tenderness.  Tinea unguium on every toenail   Neurological: She is alert and oriented to person, place, and time.  Skin: She is not diaphoretic.   BP 152/90  Pulse 83  Temp(Src) 98.7 F (37.1 C) (Oral)  Resp 16  Ht 5\' 1"  (1.549 m)  Wt 157 lb (71.215 kg)  BMI 29.68 kg/m2  SpO2 99%     Assessment:    1) tinea unguium  2) HTN 3)hyperlipidemia 4)insomnia     Plan:  Draw LFT's today, give patient written rx for lamisil with instructions to start as soon as we call to tell her that her LFT's are normal. Recheck in one month with repeat liver tests at that time

## 2014-02-23 NOTE — Patient Instructions (Signed)
Wait for return of labs and call from [facility], assuming caller says labs are fine can fill the lamisil rx and take for 3 months

## 2014-02-24 LAB — HEPATIC FUNCTION PANEL
ALT: 40 U/L — ABNORMAL HIGH (ref 0–35)
AST: 35 U/L (ref 0–37)
Albumin: 4.7 g/dL (ref 3.5–5.2)
Alkaline Phosphatase: 73 U/L (ref 39–117)
Bilirubin, Direct: 0.1 mg/dL (ref 0.0–0.3)
Indirect Bilirubin: 0.8 mg/dL (ref 0.2–1.2)
TOTAL PROTEIN: 7.7 g/dL (ref 6.0–8.3)
Total Bilirubin: 0.9 mg/dL (ref 0.2–1.2)

## 2014-02-26 LAB — HEPATITIS C ANTIBODY: HCV Ab: NEGATIVE

## 2014-02-27 ENCOUNTER — Other Ambulatory Visit: Payer: Self-pay | Admitting: Internal Medicine

## 2014-02-27 DIAGNOSIS — M549 Dorsalgia, unspecified: Secondary | ICD-10-CM

## 2014-02-28 ENCOUNTER — Telehealth: Payer: Self-pay | Admitting: *Deleted

## 2014-02-28 NOTE — Telephone Encounter (Signed)
Pt notified of lab work.

## 2014-03-02 ENCOUNTER — Ambulatory Visit
Admission: RE | Admit: 2014-03-02 | Discharge: 2014-03-02 | Disposition: A | Discharge status: Home / Self Care | Payer: Federal, State, Local not specified - PPO | Source: Ambulatory Visit | Attending: Internal Medicine | Admitting: Internal Medicine

## 2014-03-02 DIAGNOSIS — M549 Dorsalgia, unspecified: Secondary | ICD-10-CM

## 2014-03-02 MED ORDER — IOHEXOL 180 MG/ML  SOLN
1.0000 mL | Freq: Once | INTRAMUSCULAR | Status: AC | PRN
Start: 2014-03-02 — End: 2014-03-02
  Administered 2014-03-02: 1 mL via EPIDURAL

## 2014-03-02 MED ORDER — METHYLPREDNISOLONE ACETATE 40 MG/ML INJ SUSP (RADIOLOG
120.0000 mg | Freq: Once | INTRAMUSCULAR | Status: AC
  Administered 2014-03-02: 120 mg via EPIDURAL

## 2014-03-02 NOTE — Discharge Instructions (Signed)

## 2014-09-25 ENCOUNTER — Other Ambulatory Visit: Payer: Self-pay | Admitting: Internal Medicine

## 2014-09-25 DIAGNOSIS — R42 Dizziness and giddiness: Secondary | ICD-10-CM

## 2014-09-25 DIAGNOSIS — G4489 Other headache syndrome: Secondary | ICD-10-CM

## 2014-09-25 DIAGNOSIS — R040 Epistaxis: Secondary | ICD-10-CM

## 2014-09-30 ENCOUNTER — Ambulatory Visit
Admission: RE | Admit: 2014-09-30 | Discharge: 2014-09-30 | Disposition: A | Discharge status: Home / Self Care | Payer: Federal, State, Local not specified - PPO | Source: Ambulatory Visit | Attending: Internal Medicine | Admitting: Internal Medicine

## 2014-09-30 DIAGNOSIS — G4489 Other headache syndrome: Secondary | ICD-10-CM

## 2014-09-30 DIAGNOSIS — R040 Epistaxis: Secondary | ICD-10-CM

## 2014-09-30 DIAGNOSIS — R42 Dizziness and giddiness: Secondary | ICD-10-CM

## 2015-04-17 ENCOUNTER — Other Ambulatory Visit: Payer: Self-pay | Admitting: Obstetrics & Gynecology

## 2015-04-18 LAB — CYTOLOGY - PAP

## 2015-05-13 ENCOUNTER — Ambulatory Visit: Payer: Federal, State, Local not specified - PPO

## 2015-10-22 ENCOUNTER — Other Ambulatory Visit: Payer: Self-pay | Admitting: Internal Medicine

## 2015-10-22 DIAGNOSIS — R7989 Other specified abnormal findings of blood chemistry: Secondary | ICD-10-CM

## 2015-10-22 DIAGNOSIS — R945 Abnormal results of liver function studies: Secondary | ICD-10-CM

## 2015-11-05 ENCOUNTER — Ambulatory Visit
Admission: RE | Admit: 2015-11-05 | Discharge: 2015-11-05 | Disposition: A | Discharge status: Home / Self Care | Payer: Federal, State, Local not specified - PPO | Source: Ambulatory Visit | Attending: Internal Medicine | Admitting: Internal Medicine

## 2015-11-05 DIAGNOSIS — R945 Abnormal results of liver function studies: Secondary | ICD-10-CM

## 2015-11-05 DIAGNOSIS — R7989 Other specified abnormal findings of blood chemistry: Secondary | ICD-10-CM

## 2015-11-17 HISTORY — PX: BACK SURGERY: SHX140

## 2015-12-28 ENCOUNTER — Emergency Department (HOSPITAL_COMMUNITY)
Admission: EM | Admit: 2015-12-28 | Discharge: 2015-12-29 | Disposition: A | Discharge status: Home / Self Care | Payer: Federal, State, Local not specified - PPO | Attending: Emergency Medicine | Admitting: Emergency Medicine

## 2015-12-28 ENCOUNTER — Emergency Department (HOSPITAL_COMMUNITY): Payer: Federal, State, Local not specified - PPO

## 2015-12-28 ENCOUNTER — Encounter (HOSPITAL_COMMUNITY): Payer: Self-pay | Admitting: Emergency Medicine

## 2015-12-28 DIAGNOSIS — W108XXA Fall (on) (from) other stairs and steps, initial encounter: Secondary | ICD-10-CM | POA: Insufficient documentation

## 2015-12-28 DIAGNOSIS — S52592A Other fractures of lower end of left radius, initial encounter for closed fracture: Secondary | ICD-10-CM | POA: Insufficient documentation

## 2015-12-28 DIAGNOSIS — Y9389 Activity, other specified: Secondary | ICD-10-CM | POA: Diagnosis not present

## 2015-12-28 DIAGNOSIS — Y998 Other external cause status: Secondary | ICD-10-CM | POA: Diagnosis not present

## 2015-12-28 DIAGNOSIS — S52612A Displaced fracture of left ulna styloid process, initial encounter for closed fracture: Secondary | ICD-10-CM | POA: Insufficient documentation

## 2015-12-28 DIAGNOSIS — I10 Essential (primary) hypertension: Secondary | ICD-10-CM | POA: Diagnosis not present

## 2015-12-28 DIAGNOSIS — S0081XA Abrasion of other part of head, initial encounter: Secondary | ICD-10-CM

## 2015-12-28 DIAGNOSIS — Z8719 Personal history of other diseases of the digestive system: Secondary | ICD-10-CM | POA: Insufficient documentation

## 2015-12-28 DIAGNOSIS — Y9289 Other specified places as the place of occurrence of the external cause: Secondary | ICD-10-CM | POA: Diagnosis not present

## 2015-12-28 DIAGNOSIS — S6992XA Unspecified injury of left wrist, hand and finger(s), initial encounter: Secondary | ICD-10-CM | POA: Diagnosis present

## 2015-12-28 DIAGNOSIS — S01511A Laceration without foreign body of lip, initial encounter: Secondary | ICD-10-CM

## 2015-12-28 DIAGNOSIS — S52502A Unspecified fracture of the lower end of left radius, initial encounter for closed fracture: Secondary | ICD-10-CM

## 2015-12-28 DIAGNOSIS — Z79899 Other long term (current) drug therapy: Secondary | ICD-10-CM | POA: Diagnosis not present

## 2015-12-28 DIAGNOSIS — S0083XA Contusion of other part of head, initial encounter: Secondary | ICD-10-CM

## 2015-12-28 MED ORDER — ONDANSETRON HCL 4 MG/2ML IJ SOLN
4.0000 mg | INTRAMUSCULAR | Status: DC | PRN
  Filled 2015-12-28: qty 2

## 2015-12-28 MED ORDER — BUPIVACAINE HCL (PF) 0.5 % IJ SOLN
20.0000 mL | Freq: Once | INTRAMUSCULAR | Status: AC
  Administered 2015-12-28: 20 mL
  Filled 2015-12-28: qty 30

## 2015-12-28 MED ORDER — LIDOCAINE-EPINEPHRINE 2 %-1:100000 IJ SOLN
1.7000 mL | Freq: Once | INTRAMUSCULAR | Status: AC
  Administered 2015-12-28: 1.7 mL via INTRADERMAL
  Filled 2015-12-28: qty 1

## 2015-12-28 MED ORDER — MORPHINE SULFATE (PF) 4 MG/ML IV SOLN
8.0000 mg | Freq: Once | INTRAVENOUS | Status: AC
  Administered 2015-12-28: 8 mg via INTRAVENOUS
  Filled 2015-12-28: qty 2

## 2015-12-28 MED ORDER — MIDAZOLAM HCL 2 MG/2ML IJ SOLN
2.0000 mg | Freq: Once | INTRAMUSCULAR | Status: AC
  Administered 2015-12-28: 2 mg via INTRAVENOUS
  Filled 2015-12-28: qty 2

## 2015-12-28 MED ORDER — HYDROCODONE-ACETAMINOPHEN 5-325 MG PO TABS: 1.0000 | ORAL_TABLET | Freq: Four times a day (QID) | ORAL | Status: DC | PRN

## 2015-12-28 MED ORDER — BACITRACIN ZINC 500 UNIT/GM EX OINT
TOPICAL_OINTMENT | Freq: Once | CUTANEOUS | Status: AC
Start: 2015-12-28 — End: 2015-12-28
  Administered 2015-12-28: 1 via TOPICAL
  Filled 2015-12-28: qty 0.9

## 2015-12-28 NOTE — ED Notes (Signed)
Pt reports fall today , fell down steps. Pt reports landed face down. Co upper lip laceration , bleeding controled , also co left wrist pain. Obvious injury to left wrist with bruising and edema. denies loc nor neck or back pain. Alert and oriented x 4.

## 2015-12-28 NOTE — ED Provider Notes (Addendum)
CSN: [id number]     Arrival date & time 12/28/15  1657 History   First MD Initiated Contact with Patient 12/28/15 1941     Chief Complaint  Patient presents with  . Fall     (Consider location/radiation/quality/duration/timing/severity/associated sxs/prior Treatment) Patient is a 65 y.o. female presenting with fall, wrist pain, and head injury. The history is provided by the patient.  Fall This is a new problem. The current episode started 1 to 2 hours ago. The problem occurs constantly. The problem has not changed since onset.Pertinent negatives include no chest pain, no abdominal pain, no headaches and no shortness of breath.  Wrist Pain This is a new problem. Episode onset: during fall with outstretched hand. The problem occurs constantly. The problem has been gradually worsening. Pertinent negatives include no chest pain, no abdominal pain, no headaches and no shortness of breath. Exacerbated by: movement. Nothing relieves the symptoms.  Head Injury Head/neck injury location: left lateral face and lip. Mechanism of injury: fall   Pain details:    Quality:  Aching   Radiates to:  Face   Severity:  Mild   Timing:  Constant Chronicity:  New Associated symptoms: no blurred vision, no disorientation, no headaches, no loss of consciousness, no memory loss and no neck pain     Past Medical History  Diagnosis Date  . HTN (hypertension)   . GERD (gastroesophageal reflux disease)    Past Surgical History  Procedure Laterality Date  . Cesarean section  1985  . Rotator cuff repair Bilateral 2010 and 2013  . Carpal tunnel release Right 1990's  . Esophagogastroduodenoscopy N/A 09/15/2013    Procedure: ESOPHAGOGASTRODUODENOSCOPY (EGD);  Surgeon: Beryle Beams, MD;  Location: [facility];  Service: Endoscopy;  Laterality: N/A;  . Colonoscopy N/A 09/15/2013    Procedure: COLONOSCOPY;  Surgeon: Beryle Beams, MD;  Location: [facility];  Service: Endoscopy;  Laterality: N/A;   No  family history on file. Social History  Substance Use Topics  . Smoking status: Never Smoker   . Smokeless tobacco: Never Used  . Alcohol Use: Yes     Comment: occasional   OB History    No data available     Review of Systems  Eyes: Negative for blurred vision.  Respiratory: Negative for shortness of breath.   Cardiovascular: Negative for chest pain.  Gastrointestinal: Negative for abdominal pain.  Musculoskeletal: Negative for neck pain.  Neurological: Negative for loss of consciousness and headaches.  Psychiatric/Behavioral: Negative for memory loss.  All other systems reviewed and are negative.     Allergies  Sulfa antibiotics  Home Medications   Prior to Admission medications   Medication Sig Start Date End Date Taking? Authorizing Provider  atorvastatin (LIPITOR) 40 MG tablet Take 40 mg by mouth every evening.   Yes Historical Provider, MD  benazepril-hydrochlorthiazide (LOTENSIN HCT) 10-12.5 MG per tablet Take 1 tablet by mouth every morning.   Yes Historical Provider, MD  Calcium Carbonate-Vit D-Min (CALCIUM 1200 PO) Take 2 tablets by mouth daily.   Yes Historical Provider, MD  latanoprost (XALATAN) 0.005 % ophthalmic solution INSTILL 1 DROP IN BOTH EYES NIGHTLY 12/09/15  Yes Historical Provider, MD  Multiple Vitamin (MULTIVITAMIN WITH MINERALS) TABS tablet Take 1 tablet by mouth daily.   Yes Historical Provider, MD  sertraline (ZOLOFT) 100 MG tablet Take 200 mg by mouth daily. 12/14/15  Yes Historical Provider, MD  terbinafine (LAMISIL) 250 MG tablet Take 1 tablet (250 mg total) by mouth daily. Patient not taking: Reported  on 12/28/2015 02/23/14   Darlyne Russian, MD  zolpidem (AMBIEN) 5 MG tablet Take 5 mg by mouth at bedtime as needed for sleep.    Historical Provider, MD   BP 133/86 mmHg  Pulse 98  Temp(Src) 98.5 F (36.9 C) (Oral)  Resp 18  SpO2 93% Physical Exam  Constitutional: She is oriented to person, place, and time. She appears well-developed and  well-nourished. No distress.  HENT:  Head: Normocephalic. Head is with abrasion (over left face lateral to eye). Head is without raccoon's eyes, without Battle's sign and without laceration.    Mouth/Throat: Lacerations (1cm over left upper lip) present.    Eyes: Conjunctivae and EOM are normal. Left conjunctiva is not injected. Left conjunctiva has no hemorrhage.  Neck: Neck supple. No tracheal deviation present.  Cardiovascular: Normal rate and regular rhythm.   Pulmonary/Chest: Effort normal. No respiratory distress.  Abdominal: Soft. She exhibits no distension.  Musculoskeletal:       Left wrist: She exhibits decreased range of motion, tenderness, swelling and deformity (dorsally angulated). She exhibits no laceration.       Left hand: She exhibits normal range of motion and normal capillary refill. Normal sensation noted. Decreased sensation is not present in the ulnar distribution, is not present in the medial redistribution and is not present in the radial distribution. Normal strength noted.  Neurological: She is alert and oriented to person, place, and time. She has normal strength. No cranial nerve deficit. Coordination normal. GCS eye subscore is 4. GCS verbal subscore is 5. GCS motor subscore is 6.  Skin: Skin is warm and dry.  Psychiatric: She has a normal mood and affect. Her speech is normal and behavior is normal.    ED Course  ORTHOPEDIC INJURY TREATMENT Date/Time: 12/28/2015 11:38 PM Performed by: Leo Grosser Authorized by: Leo Grosser Consent: Verbal consent obtained. Risks and benefits: risks, benefits and alternatives were discussed Consent given by: patient Patient understanding: patient states understanding of the procedure being performed Required items: required blood products, implants, devices, and special equipment available Patient identity confirmed: verbally with patient, arm band, provided demographic data and hospital-assigned identification  number Time out: Immediately prior to procedure a "time out" was called to verify the correct patient, procedure, equipment, support staff and site/side marked as required. Injury location: wrist Location details: right wrist Injury type: fracture Fracture type: distal radius and ulnar styloid Pre-procedure neurovascular assessment: neurovascularly intact Pre-procedure distal perfusion: normal Pre-procedure neurological function: normal Pre-procedure range of motion: reduced Local anesthesia used: yes Anesthesia: hematoma block Local anesthetic: bupivacaine 0.5% without epinephrine Anesthetic total: 10 ml Patient sedated: yes Sedation type: anxiolysis Sedatives: midazolam Analgesia: morphine Vitals: Vital signs were monitored during sedation. Manipulation performed: yes Skin traction used: finger trap. Reduction successful: yes X-ray confirmed reduction: yes Immobilization: splint Splint type: sugar tong Supplies used: plaster Post-procedure neurovascular assessment: post-procedure neurovascularly intact Post-procedure distal perfusion: normal Post-procedure neurological function: normal Post-procedure range of motion: normal Patient tolerance: Patient tolerated the procedure well with no immediate complications Comments: LEFT wrist was reduced. Wrong side entered in error.   (including critical care time)  LACERATION REPAIR Performed by: Leo Grosser Authorized by: Leo Grosser Consent: Verbal consent obtained. Risks and benefits: risks, benefits and alternatives were discussed Consent given by: patient Patient identity confirmed: provided demographic data Prepped and Draped in normal sterile fashion Wound explored  Laceration Location: left upper lip excluding vermilion border  Laceration Length: 1 cm  No Foreign Bodies seen or palpated  Anesthesia: local  infiltration  Local anesthetic: lidocaine 1% wo epinephrine  Anesthetic total: 2 ml  Irrigation  method: syringe Amount of cleaning: standard  Skin closure: 5-0  Number of sutures: 2  Technique: simple interrupted  Patient tolerance: Patient tolerated the procedure well with no immediate complications.  SPLINT APPLICATION Date/Time: [id number] PM Authorized by: Leo Grosser Consent: Verbal consent obtained. Risks and benefits: risks, benefits and alternatives were discussed Consent given by: patient Splint applied by: orthopedic technician Location details: right arm Splint type: sugar tong Supplies used: plaster Post-procedure: The splinted body part was neurovascularly unchanged following the procedure. Patient tolerance: Patient tolerated the procedure well with no immediate complications.   Labs Review Labs Reviewed - No data to display  Imaging Review Dg Wrist Complete Left  12/28/2015  CLINICAL DATA:  Status post reduction of left wrist fracture. Initial encounter. EXAM: LEFT WRIST - COMPLETE 3+ VIEW COMPARISON:  Left wrist radiographs performed earlier today at 5:30 p.m. FINDINGS: A mildly impacted comminuted fracture of the distal radius is again noted. Alignment is improved, though there is slight residual dorsal displacement. A mildly displaced ulnar styloid fracture is also seen. The carpal rows appear grossly intact, and demonstrate normal alignment. The soft tissues are difficult to fully assess due to the surrounding splint. IMPRESSION: Improved alignment of mildly impacted comminuted fracture of the distal radius, with slight residual dorsal displacement. No significant angulation seen. Mildly displaced ulnar styloid fracture also noted. Electronically Signed   By: Garald Balding M.D.   On: 12/28/2015 23:15   Dg Wrist Complete Left  12/28/2015  CLINICAL DATA:  Fall, left wrist pain EXAM: LEFT WRIST - COMPLETE 3+ VIEW COMPARISON:  None. FINDINGS: Comminuted distal radial fracture with dorsal displacement of the distal fracture fragments. Mildly displaced ulnar styloid  fracture. Associated soft tissue swelling/deformity. IMPRESSION: Comminuted, displaced distal radial fracture, as above. Mildly displaced ulnar styloid fracture. Electronically Signed   By: Julian Hy M.D.   On: 12/28/2015 17:39   I have personally reviewed and evaluated these images and lab results as part of my medical decision-making.   EKG Interpretation None      MDM   Final diagnoses:  Lip laceration, initial encounter  Facial abrasion, initial encounter  Facial contusion, initial encounter  Distal radius fracture, left, closed, initial encounter  Fracture of ulnar styloid, left, closed, initial encounter    65 y.o. female presents with fall after losing balance while carrying boxes down stairs landing on outstretched left wrist and face on carpet. Has some abrasions to left face without bony tenderness, left upper lip laceration, and deformity of left wrist. No clinical signs of orbital fracture or injury to eye. NVI distal to injury on initial examination. Pt was placed in finger traps and hematoma block applied after anxiolysis. Reduced without signs of complications and remains NVI after splint application. Lip lac repaired with local anaesthesia  And absorbable suture and local wound care to face with bacitracin. Discussed return precautions for loss of sensation, movement, color change, or other concerning symptoms. D/w Dr Amedeo Plenty regarding follow up and he saw Pt in ED. She is complaining of loss of sensation in median nerve distribution on his exam possibly related to bupivacaine effect on carpal tunnel or compression of median nerve d/t hematoma or swelling. She was advised to return to [facility] in the morning for re-evaluation if this symptom does not resolve to consider release of carpal tunnel. Pt provided analgesia for home and will return if symptoms worsen or any other  complications of injury arise.    Leo Grosser, MD 12/29/15 [phone]  Initially right wrist was  entered into procedure portion of note and d/c instructions in error. LEFT wrist was the injured extremity.  Leo Grosser, MD 12/29/15 1254

## 2015-12-28 NOTE — Discharge Instructions (Signed)
Cast or Splint Care °Casts and splints support injured limbs and keep bones from moving while they heal. It is important to care for your cast or splint at home.   °HOME CARE INSTRUCTIONS °· Keep the cast or splint uncovered during the drying period. It can take 24 to 48 hours to dry if it is made of plaster. A fiberglass cast will dry in less than 1 hour. °· Do not rest the cast on anything harder than a pillow for the first 24 hours. °· Do not put weight on your injured limb or apply pressure to the cast until your health care provider gives you permission. °· Keep the cast or splint dry. Wet casts or splints can lose their shape and may not support the limb as well. A wet cast that has lost its shape can also create harmful pressure on your skin when it dries. Also, wet skin can become infected. °· Cover the cast or splint with a plastic bag when bathing or when out in the rain or snow. If the cast is on the trunk of the body, take sponge baths until the cast is removed. °· If your cast does become wet, dry it with a towel or a blow dryer on the cool setting only. °· Keep your cast or splint clean. Soiled casts may be wiped with a moistened cloth. °· Do not place any hard or soft foreign objects under your cast or splint, such as cotton, toilet paper, lotion, or powder. °· Do not try to scratch the skin under the cast with any object. The object could get stuck inside the cast. Also, scratching could lead to an infection. If itching is a problem, use a blow dryer on a cool setting to relieve discomfort. °· Do not trim or cut your cast or remove padding from inside of it. °· Exercise all joints next to the injury that are not immobilized by the cast or splint. For example, if you have a long leg cast, exercise the hip joint and toes. If you have an arm cast or splint, exercise the shoulder, elbow, thumb, and fingers. °· Elevate your injured arm or leg on 1 or 2 pillows for the first 1 to 3 days to decrease  swelling and pain. It is best if you can comfortably elevate your cast so it is higher than your heart. °SEEK MEDICAL CARE IF:  °· Your cast or splint cracks. °· Your cast or splint is too tight or too loose. °· You have unbearable itching inside the cast. °· Your cast becomes wet or develops a soft spot or area. °· You have a bad smell coming from inside your cast. °· You get an object stuck under your cast. °· Your skin around the cast becomes red or raw. °· You have new pain or worsening pain after the cast has been applied. °SEEK IMMEDIATE MEDICAL CARE IF:  °· You have fluid leaking through the cast. °· You are unable to move your fingers or toes. °· You have discolored (blue or white), cool, painful, or very swollen fingers or toes beyond the cast. °· You have tingling or numbness around the injured area. °· You have severe pain or pressure under the cast. °· You have any difficulty with your breathing or have shortness of breath. °· You have chest pain. °  °This information is not intended to replace advice given to you by your health care provider. Make sure you discuss any questions you have with your health care   provider.   Document Released: 10/30/2000 Document Revised: 08/23/2013 Document Reviewed: 05/11/2013 Elsevier Interactive Patient Education 2016 [employer] Radial Fracture A radial fracture is a break in the radius bone, which is the long bone of the forearm that is on the same side as your thumb. Your forearm is the part of your arm that is between your elbow and your wrist. It is made up of two bones: the radius and the ulna. Most radial fractures occur near the wrist (distal radialfracture) or near the elbow (radial head fracture). A distal radial fracture is the most common type of broken arm. This fracture usually occurs about an inch above the wrist. Fractures of the middle part of the bone are less common. CAUSES  Falling with your arm outstretched is the most common cause of a  radial fracture. Other causes include:  Car accidents.  Bike accidents.  A direct blow to the middle part of the radius. RISK FACTORS  You may be at greater risk for a distal radial fracture if you are 66 years of age or older.  You may be at greater risk for a radial head fracture if you are:  Female.  51-66 years old.  You may be at a greater risk for all types of radial fractures if you have a condition that causes your bones to be weak or thin (osteoporosis). SIGNS AND SYMPTOMS A radial fracture causes pain immediately after the injury. Other signs and symptoms include:  An abnormal bend or bump in your arm (deformity).  Swelling.  Bruising.  Numbness or tingling.  Tenderness.  Limited movement. DIAGNOSIS  Your health care provider may diagnose a radial fracture based on:  Your symptoms.  Your medical history, including any recent injury.  A physical exam. Your health care provider will look for any deformity and feel for tenderness over the break. Your health care provider will also check whether the bone is out of place.  An X-ray exam to confirm the diagnosis and learn more about the type of fracture. TREATMENT The goals of treatment are to get the bone in proper position for healing and to keep it from moving so it will heal over time. Your treatment will depend on many factors, especially the type of fracture that you have.  If the fractured bone:  Is in the correct position (nondisplaced), you may only need to wear a cast or a splint.  Has a slightly displaced fracture, you may need to have the bones moved back into place manually (closed reduction) before the splint or cast is put on.  You may have a temporary splint before you have a plaster cast. The splint allows room for some swelling. After a few days, a cast can replace the splint.  You may have to wear the cast for about 6 weeks or as directed by your health care provider.  The cast may be  changed after about 3 weeks or as directed by your health care provider.  After your cast is taken off, you may need physical therapy to regain full movement in your wrist or elbow.  You may need emergency surgery if you have:  A fractured bone that is out of position (displaced).  A fracture with multiple fragments (comminuted fracture).  A fracture that breaks the skin (open fracture). This type of fracture may require surgical wires, plates, or screws to hold the bone in place.  You may have X-rays every couple of weeks to check on your healing. HOME  CARE INSTRUCTIONS  Keep the injured arm above the level of your heart while you are sitting or lying down. This helps to reduce swelling and pain.  Apply ice to the injured area:  Put ice in a plastic bag.  Place a towel between your skin and the bag.  Leave the ice on for 20 minutes, 2-3 times per day.  Move your fingers often to avoid stiffness and to minimize swelling.  If you have a plaster or fiberglass cast:  Do not try to scratch the skin under the cast using sharp or pointed objects.  Check the skin around the cast every day. You may put lotion on any red or sore areas.  Keep your cast dry and clean.  If you have a plaster splint:  Wear the splint as directed.  Loosen the elastic around the splint if your fingers become numb and tingle, or if they turn cold and blue.  Do not put pressure on any part of your cast until it is fully hardened. Rest your cast only on a pillow for the first 24 hours.  Protect your cast or splint while bathing or showering, as directed by your health care provider. Do not put your cast or splint into water.  Take medicines only as directed by your health care provider.  Return to activities, such as sports, as directed by your health care provider. Ask your health care provider what activities are safe for you.  Keep all follow-up visits as directed by your health care provider. This  is important. SEEK MEDICAL CARE IF:  Your pain medicine is not helping.  Your cast gets damaged or it breaks.  Your cast becomes loose.  Your cast gets wet.  You have more severe pain or swelling than you did before the cast.  You have severe pain when stretching your fingers.  You continue to have pain or stiffness in your elbow or your wrist after your cast is taken off. SEEK IMMEDIATE MEDICAL CARE IF:  You cannot move your fingers.  You lose feeling in your fingers or your hand.  Your hand or your fingers turn cold and pale or blue.  You notice a bad smell coming from your cast.  You have drainage from underneath your cast.  You have new stains from blood or drainage seeping through your cast.   This information is not intended to replace advice given to you by your health care provider. Make sure you discuss any questions you have with your health care provider.   Document Released: 04/15/2006 Document Revised: 11/23/2014 Document Reviewed: 04/27/2014 Elsevier Interactive Patient Education [employer].

## 2015-12-29 NOTE — ED Notes (Signed)
Dr Gramig at bedside. 

## 2016-01-01 ENCOUNTER — Other Ambulatory Visit: Payer: Self-pay | Admitting: Orthopedic Surgery

## 2016-01-03 ENCOUNTER — Encounter (HOSPITAL_COMMUNITY): Payer: Self-pay | Admitting: *Deleted

## 2016-01-03 NOTE — Progress Notes (Signed)
Pt denies cardiac history, chest pain or sob. Pt thinks she's had an EKG done at Dr. Mathis Fare office in the past year. Have requested a copy from Dr. Mathis Fare office to be faxed to [place].

## 2016-01-04 ENCOUNTER — Encounter (HOSPITAL_COMMUNITY)
Admission: RE | Disposition: A | Discharge status: Home / Self Care | Payer: Self-pay | Source: Ambulatory Visit | Attending: Orthopedic Surgery

## 2016-01-04 ENCOUNTER — Encounter (HOSPITAL_COMMUNITY): Payer: Self-pay | Admitting: *Deleted

## 2016-01-04 ENCOUNTER — Ambulatory Visit (HOSPITAL_COMMUNITY): Payer: Federal, State, Local not specified - PPO | Admitting: Certified Registered Nurse Anesthetist

## 2016-01-04 ENCOUNTER — Observation Stay (HOSPITAL_COMMUNITY)
Admission: RE | Admit: 2016-01-04 | Discharge: 2016-01-05 | Disposition: A | Discharge status: Home / Self Care | Payer: Federal, State, Local not specified - PPO | Source: Ambulatory Visit | Attending: Orthopedic Surgery | Admitting: Orthopedic Surgery

## 2016-01-04 DIAGNOSIS — S52509A Unspecified fracture of the lower end of unspecified radius, initial encounter for closed fracture: Secondary | ICD-10-CM | POA: Diagnosis present

## 2016-01-04 DIAGNOSIS — S52579A Other intraarticular fracture of lower end of unspecified radius, initial encounter for closed fracture: Principal | ICD-10-CM | POA: Insufficient documentation

## 2016-01-04 DIAGNOSIS — K219 Gastro-esophageal reflux disease without esophagitis: Secondary | ICD-10-CM | POA: Diagnosis not present

## 2016-01-04 DIAGNOSIS — X58XXXA Exposure to other specified factors, initial encounter: Secondary | ICD-10-CM | POA: Insufficient documentation

## 2016-01-04 DIAGNOSIS — I10 Essential (primary) hypertension: Secondary | ICD-10-CM | POA: Insufficient documentation

## 2016-01-04 DIAGNOSIS — F329 Major depressive disorder, single episode, unspecified: Secondary | ICD-10-CM | POA: Diagnosis not present

## 2016-01-04 HISTORY — PX: ORIF RADIAL FRACTURE: SHX5113

## 2016-01-04 HISTORY — DX: Unspecified osteoarthritis, unspecified site: M19.90

## 2016-01-04 HISTORY — DX: Major depressive disorder, single episode, unspecified: F32.9

## 2016-01-04 HISTORY — DX: Depression, unspecified: F32.A

## 2016-01-04 HISTORY — DX: Headache, unspecified: R51.9

## 2016-01-04 HISTORY — PX: CARPAL TUNNEL RELEASE: SHX101

## 2016-01-04 HISTORY — DX: Headache: R51

## 2016-01-04 HISTORY — DX: Anemia, unspecified: D64.9

## 2016-01-04 HISTORY — DX: Unspecified glaucoma: H40.9

## 2016-01-04 LAB — BASIC METABOLIC PANEL
ANION GAP: 12 (ref 5–15)
BUN: 23 mg/dL — ABNORMAL HIGH (ref 6–20)
CALCIUM: 9.6 mg/dL (ref 8.9–10.3)
CO2: 23 mmol/L (ref 22–32)
Chloride: 105 mmol/L (ref 101–111)
Creatinine, Ser: 0.75 mg/dL (ref 0.44–1.00)
Glucose, Bld: 99 mg/dL (ref 65–99)
POTASSIUM: 4.1 mmol/L (ref 3.5–5.1)
SODIUM: 140 mmol/L (ref 135–145)

## 2016-01-04 LAB — CBC
HEMATOCRIT: 35.2 % — AB (ref 36.0–46.0)
HEMOGLOBIN: 11.6 g/dL — AB (ref 12.0–15.0)
MCH: 26.2 pg (ref 26.0–34.0)
MCHC: 33 g/dL (ref 30.0–36.0)
MCV: 79.6 fL (ref 78.0–100.0)
Platelets: 201 10*3/uL (ref 150–400)
RBC: 4.42 MIL/uL (ref 3.87–5.11)
RDW: 15.3 % (ref 11.5–15.5)
WBC: 4.4 10*3/uL (ref 4.0–10.5)

## 2016-01-04 SURGERY — OPEN REDUCTION INTERNAL FIXATION (ORIF) RADIAL FRACTURE
Anesthesia: Monitor Anesthesia Care | Site: Wrist | Laterality: Left

## 2016-01-04 MED ORDER — PROPOFOL 10 MG/ML IV BOLUS
INTRAVENOUS | Status: AC
  Filled 2016-01-04: qty 40

## 2016-01-04 MED ORDER — DEXTROSE 5 % IV SOLN
500.0000 mg | Freq: Four times a day (QID) | INTRAVENOUS | Status: DC | PRN
  Filled 2016-01-04: qty 5

## 2016-01-04 MED ORDER — DOCUSATE SODIUM 100 MG PO CAPS
100.0000 mg | ORAL_CAPSULE | Freq: Two times a day (BID) | ORAL | Status: DC
  Administered 2016-01-04 – 2016-01-05 (×2): 100 mg via ORAL
  Filled 2016-01-04 (×2): qty 1

## 2016-01-04 MED ORDER — CEFAZOLIN SODIUM 1-5 GM-% IV SOLN
1.0000 g | INTRAVENOUS | Status: DC
  Filled 2016-01-04: qty 50

## 2016-01-04 MED ORDER — LATANOPROST 0.005 % OP SOLN
1.0000 [drp] | Freq: Every day | OPHTHALMIC | Status: DC
  Administered 2016-01-04: 1 [drp] via OPHTHALMIC
  Filled 2016-01-04: qty 2.5

## 2016-01-04 MED ORDER — VITAMIN C 500 MG PO TABS
1000.0000 mg | ORAL_TABLET | Freq: Every day | ORAL | Status: DC
  Administered 2016-01-04 – 2016-01-05 (×2): 1000 mg via ORAL
  Filled 2016-01-04 (×2): qty 2

## 2016-01-04 MED ORDER — ACETAMINOPHEN 325 MG PO TABS: 325.0000 mg | ORAL_TABLET | ORAL | Status: DC | PRN

## 2016-01-04 MED ORDER — SODIUM CHLORIDE 0.9 % IJ SOLN
INTRAMUSCULAR | Status: AC
  Filled 2016-01-04: qty 10

## 2016-01-04 MED ORDER — HYDROCHLOROTHIAZIDE 12.5 MG PO CAPS
12.5000 mg | ORAL_CAPSULE | Freq: Every day | ORAL | Status: DC
  Administered 2016-01-05: 12.5 mg via ORAL
  Filled 2016-01-04: qty 1

## 2016-01-04 MED ORDER — 0.9 % SODIUM CHLORIDE (POUR BTL) OPTIME
TOPICAL | Status: DC | PRN
  Administered 2016-01-04: 1000 mL

## 2016-01-04 MED ORDER — MIDAZOLAM HCL 2 MG/2ML IJ SOLN
INTRAMUSCULAR | Status: AC
  Filled 2016-01-04: qty 2

## 2016-01-04 MED ORDER — FENTANYL CITRATE (PF) 100 MCG/2ML IJ SOLN: 25.0000 ug | INTRAMUSCULAR | Status: DC | PRN

## 2016-01-04 MED ORDER — CHLORHEXIDINE GLUCONATE 4 % EX LIQD: 60.0000 mL | Freq: Once | CUTANEOUS | Status: DC

## 2016-01-04 MED ORDER — LACTATED RINGERS IV SOLN
INTRAVENOUS | Status: DC
  Administered 2016-01-04: 17:00:00 via INTRAVENOUS

## 2016-01-04 MED ORDER — ONDANSETRON HCL 4 MG/2ML IJ SOLN
INTRAMUSCULAR | Status: DC | PRN
  Administered 2016-01-04: 4 mg via INTRAVENOUS

## 2016-01-04 MED ORDER — ONDANSETRON HCL 4 MG/2ML IJ SOLN: 4.0000 mg | Freq: Four times a day (QID) | INTRAMUSCULAR | Status: DC | PRN

## 2016-01-04 MED ORDER — BENAZEPRIL HCL 20 MG PO TABS
10.0000 mg | ORAL_TABLET | Freq: Every day | ORAL | Status: DC
Start: 2016-01-05 — End: 2016-01-05
  Administered 2016-01-05: 10 mg via ORAL
  Filled 2016-01-04: qty 1

## 2016-01-04 MED ORDER — CEFAZOLIN SODIUM-DEXTROSE 2-3 GM-% IV SOLR
INTRAVENOUS | Status: AC
  Filled 2016-01-04: qty 50

## 2016-01-04 MED ORDER — CEFAZOLIN SODIUM-DEXTROSE 2-3 GM-% IV SOLR
2.0000 g | INTRAVENOUS | Status: AC
  Administered 2016-01-04: 2 g via INTRAVENOUS

## 2016-01-04 MED ORDER — FENTANYL CITRATE (PF) 250 MCG/5ML IJ SOLN
INTRAMUSCULAR | Status: AC
  Filled 2016-01-04: qty 5

## 2016-01-04 MED ORDER — PROPOFOL 500 MG/50ML IV EMUL
INTRAVENOUS | Status: DC | PRN
  Administered 2016-01-04: 10 ug/kg/min via INTRAVENOUS

## 2016-01-04 MED ORDER — OXYCODONE HCL 5 MG PO TABS
5.0000 mg | ORAL_TABLET | ORAL | Status: DC | PRN
  Administered 2016-01-04 – 2016-01-05 (×5): 10 mg via ORAL
  Filled 2016-01-04 (×5): qty 2

## 2016-01-04 MED ORDER — FENTANYL CITRATE (PF) 100 MCG/2ML IJ SOLN
25.0000 ug | Freq: Once | INTRAMUSCULAR | Status: AC
  Administered 2016-01-04: 25 ug via INTRAVENOUS

## 2016-01-04 MED ORDER — LACTATED RINGERS IV SOLN
INTRAVENOUS | Status: DC
  Administered 2016-01-04: 10:00:00 via INTRAVENOUS

## 2016-01-04 MED ORDER — PHENYLEPHRINE 40 MCG/ML (10ML) SYRINGE FOR IV PUSH (FOR BLOOD PRESSURE SUPPORT)
PREFILLED_SYRINGE | INTRAVENOUS | Status: AC
  Filled 2016-01-04: qty 10

## 2016-01-04 MED ORDER — PROMETHAZINE HCL 25 MG RE SUPP: 12.5000 mg | Freq: Four times a day (QID) | RECTAL | Status: DC | PRN

## 2016-01-04 MED ORDER — BUPIVACAINE-EPINEPHRINE (PF) 0.5% -1:200000 IJ SOLN
INTRAMUSCULAR | Status: DC | PRN
  Administered 2016-01-04: 30 mL via PERINEURAL

## 2016-01-04 MED ORDER — FENTANYL CITRATE (PF) 100 MCG/2ML IJ SOLN
INTRAMUSCULAR | Status: AC
  Administered 2016-01-04: 25 ug via INTRAVENOUS
  Filled 2016-01-04: qty 2

## 2016-01-04 MED ORDER — EPHEDRINE SULFATE 50 MG/ML IJ SOLN
INTRAMUSCULAR | Status: AC
  Filled 2016-01-04: qty 1

## 2016-01-04 MED ORDER — ONDANSETRON HCL 4 MG PO TABS: 4.0000 mg | ORAL_TABLET | Freq: Four times a day (QID) | ORAL | Status: DC | PRN

## 2016-01-04 MED ORDER — SUCCINYLCHOLINE CHLORIDE 20 MG/ML IJ SOLN
INTRAMUSCULAR | Status: AC
  Filled 2016-01-04: qty 1

## 2016-01-04 MED ORDER — MIDAZOLAM HCL 5 MG/5ML IJ SOLN
INTRAMUSCULAR | Status: DC | PRN
  Administered 2016-01-04 (×4): .5 mg via INTRAVENOUS

## 2016-01-04 MED ORDER — MEPIVACAINE HCL 1.5 % IJ SOLN
INTRAMUSCULAR | Status: DC | PRN
  Administered 2016-01-04: 10 mL via PERINEURAL

## 2016-01-04 MED ORDER — MORPHINE SULFATE (PF) 2 MG/ML IV SOLN: 1.0000 mg | INTRAVENOUS | Status: DC | PRN

## 2016-01-04 MED ORDER — ACETAMINOPHEN 160 MG/5ML PO SOLN
325.0000 mg | ORAL | Status: DC | PRN
  Filled 2016-01-04: qty 20.3

## 2016-01-04 MED ORDER — BUPIVACAINE HCL (PF) 0.25 % IJ SOLN
INTRAMUSCULAR | Status: AC
  Filled 2016-01-04: qty 30

## 2016-01-04 MED ORDER — ONDANSETRON HCL 4 MG/2ML IJ SOLN
INTRAMUSCULAR | Status: AC
  Filled 2016-01-04: qty 2

## 2016-01-04 MED ORDER — OXYCODONE HCL 5 MG/5ML PO SOLN: 5.0000 mg | Freq: Once | ORAL | Status: DC | PRN

## 2016-01-04 MED ORDER — FAMOTIDINE 20 MG PO TABS: 20.0000 mg | ORAL_TABLET | Freq: Two times a day (BID) | ORAL | Status: DC | PRN

## 2016-01-04 MED ORDER — CEFAZOLIN SODIUM 1-5 GM-% IV SOLN
1.0000 g | Freq: Three times a day (TID) | INTRAVENOUS | Status: DC
  Administered 2016-01-05 (×2): 1 g via INTRAVENOUS
  Filled 2016-01-04 (×3): qty 50

## 2016-01-04 MED ORDER — OXYCODONE HCL 5 MG PO TABS: 5.0000 mg | ORAL_TABLET | Freq: Once | ORAL | Status: DC | PRN

## 2016-01-04 MED ORDER — BENAZEPRIL-HYDROCHLOROTHIAZIDE 10-12.5 MG PO TABS: 1.0000 | ORAL_TABLET | Freq: Every morning | ORAL | Status: DC

## 2016-01-04 MED ORDER — METHOCARBAMOL 500 MG PO TABS
500.0000 mg | ORAL_TABLET | Freq: Four times a day (QID) | ORAL | Status: DC | PRN
Start: 2016-01-04 — End: 2016-01-05
  Administered 2016-01-05: 500 mg via ORAL
  Filled 2016-01-04: qty 1

## 2016-01-04 MED ORDER — LIDOCAINE HCL (CARDIAC) 20 MG/ML IV SOLN
INTRAVENOUS | Status: AC
  Filled 2016-01-04: qty 5

## 2016-01-04 SURGICAL SUPPLY — 67 items
BANDAGE ACE 4X5 VEL STRL LF (GAUZE/BANDAGES/DRESSINGS) ×2 IMPLANT
BANDAGE ELASTIC 3 VELCRO ST LF (GAUZE/BANDAGES/DRESSINGS) IMPLANT
BANDAGE ELASTIC 4 VELCRO ST LF (GAUZE/BANDAGES/DRESSINGS) IMPLANT
BIT DRILL 2.2 SS TIBIAL (BIT) ×2 IMPLANT
BLADE SURG ROTATE 9660 (MISCELLANEOUS) IMPLANT
BNDG ESMARK 4X9 LF (GAUZE/BANDAGES/DRESSINGS) ×2 IMPLANT
BNDG GAUZE ELAST 4 BULKY (GAUZE/BANDAGES/DRESSINGS) ×2 IMPLANT
CORDS BIPOLAR (ELECTRODE) ×2 IMPLANT
COVER SURGICAL LIGHT HANDLE (MISCELLANEOUS) ×2 IMPLANT
CUFF TOURNIQUET SINGLE 18IN (TOURNIQUET CUFF) ×2 IMPLANT
CUFF TOURNIQUET SINGLE 24IN (TOURNIQUET CUFF) IMPLANT
DECANTER SPIKE VIAL GLASS SM (MISCELLANEOUS) IMPLANT
DRAIN TLS ROUND 10FR (DRAIN) IMPLANT
DRAPE OEC MINIVIEW 54X84 (DRAPES) IMPLANT
DRAPE SURG 17X23 STRL (DRAPES) ×2 IMPLANT
DRAPE U-SHAPE 47X51 STRL (DRAPES) ×2 IMPLANT
DRSG ADAPTIC 3X8 NADH LF (GAUZE/BANDAGES/DRESSINGS) ×2 IMPLANT
EVACUATOR 1/8 PVC DRAIN (DRAIN) IMPLANT
GAUZE SPONGE 4X4 12PLY STRL (GAUZE/BANDAGES/DRESSINGS) IMPLANT
GAUZE XEROFORM 1X8 LF (GAUZE/BANDAGES/DRESSINGS) IMPLANT
GAUZE XEROFORM 5X9 LF (GAUZE/BANDAGES/DRESSINGS) ×2 IMPLANT
GLOVE BIOGEL M STRL SZ7.5 (GLOVE) ×2 IMPLANT
GLOVE ORTHO TXT STRL SZ7.5 (GLOVE) IMPLANT
GLOVE SS BIOGEL STRL SZ 8 (GLOVE) ×2 IMPLANT
GLOVE SUPERSENSE BIOGEL SZ 8 (GLOVE) ×2
GOWN STRL REUS W/ TWL LRG LVL3 (GOWN DISPOSABLE) ×1 IMPLANT
GOWN STRL REUS W/ TWL XL LVL3 (GOWN DISPOSABLE) ×1 IMPLANT
GOWN STRL REUS W/TWL LRG LVL3 (GOWN DISPOSABLE) ×1
GOWN STRL REUS W/TWL XL LVL3 (GOWN DISPOSABLE) ×1
KIT BASIN OR (CUSTOM PROCEDURE TRAY) ×2 IMPLANT
KIT ROOM TURNOVER OR (KITS) ×2 IMPLANT
LOOP VESSEL MAXI BLUE (MISCELLANEOUS) IMPLANT
MANIFOLD NEPTUNE II (INSTRUMENTS) ×2 IMPLANT
NEEDLE 22X1 1/2 (OR ONLY) (NEEDLE) IMPLANT
NEEDLE HYPO 25GX1X1/2 BEV (NEEDLE) IMPLANT
NS IRRIG 1000ML POUR BTL (IV SOLUTION) ×2 IMPLANT
PACK ORTHO EXTREMITY (CUSTOM PROCEDURE TRAY) ×2 IMPLANT
PAD ARMBOARD 7.5X6 YLW CONV (MISCELLANEOUS) ×4 IMPLANT
PAD CAST 4YDX4 CTTN HI CHSV (CAST SUPPLIES) ×1 IMPLANT
PADDING CAST COTTON 4X4 STRL (CAST SUPPLIES) ×1
PEG LOCKING SMOOTH 2.2X16 (Screw) ×2 IMPLANT
PEG LOCKING SMOOTH 2.2X18 (Peg) ×10 IMPLANT
PEG LOCKING SMOOTH 2.2X20 (Screw) ×2 IMPLANT
PLATE STANDARD DVR LEFT (Plate) ×2 IMPLANT
PLATE STD DVR LT 24X51 (Plate) ×1 IMPLANT
SCREW LOCK 14X2.7X 3 LD TPR (Screw) ×3 IMPLANT
SCREW LOCKING 2.7X13MM (Screw) ×2 IMPLANT
SCREW LOCKING 2.7X14 (Screw) ×3 IMPLANT
SOLUTION BETADINE 4OZ (MISCELLANEOUS) ×2 IMPLANT
SPLINT FIBERGLASS 3X35 (CAST SUPPLIES) ×2 IMPLANT
SPONGE GAUZE 4X4 12PLY STER LF (GAUZE/BANDAGES/DRESSINGS) ×2 IMPLANT
SPONGE LAP 4X18 X RAY DECT (DISPOSABLE) IMPLANT
SPONGE SCRUB IODOPHOR (GAUZE/BANDAGES/DRESSINGS) ×2 IMPLANT
SUT MNCRL AB 4-0 PS2 18 (SUTURE) IMPLANT
SUT PROLENE 3 0 PS 2 (SUTURE) IMPLANT
SUT PROLENE 4 0 PS 2 18 (SUTURE) ×4 IMPLANT
SUT VIC AB 2-0 CT1 27 (SUTURE)
SUT VIC AB 2-0 CT1 TAPERPNT 27 (SUTURE) IMPLANT
SUT VIC AB 3-0 FS2 27 (SUTURE) ×2 IMPLANT
SYR CONTROL 10ML LL (SYRINGE) IMPLANT
SYSTEM CHEST DRAIN TLS 7FR (DRAIN) ×4 IMPLANT
TOWEL OR 17X24 6PK STRL BLUE (TOWEL DISPOSABLE) ×2 IMPLANT
TOWEL OR 17X26 10 PK STRL BLUE (TOWEL DISPOSABLE) ×2 IMPLANT
TUBE CONNECTING 12X1/4 (SUCTIONS) ×2 IMPLANT
TUBE EVACUATION TLS (MISCELLANEOUS) IMPLANT
UNDERPAD 30X30 INCONTINENT (UNDERPADS AND DIAPERS) ×2 IMPLANT
WATER STERILE IRR 1000ML POUR (IV SOLUTION) ×2 IMPLANT

## 2016-01-04 NOTE — Transfer of Care (Signed)
Immediate Anesthesia Transfer of Care Note  Patient: Alyssa Valentine  Procedure(s) Performed: Procedure(s): OPEN REDUCTION INTERNAL FIXATION (ORIF) LEFT RADIAL FRACTURE (Left) CARPAL TUNNEL RELEASE (Left)  Patient Location: PACU  Anesthesia Type:General  Level of Consciousness: awake, alert  and oriented  Airway & Oxygen Therapy: Patient Spontanous Breathing and Patient connected to face mask oxygen  Post-op Assessment: Report given to RN and Post -op Vital signs reviewed and stable  Post vital signs: Reviewed and stable  Last Vitals:  Filed Vitals:   01/04/16 1129 01/04/16 1130  BP:    Pulse: 89 90  Temp:    Resp: 13 16    Complications: No apparent anesthesia complications

## 2016-01-04 NOTE — H&P (Signed)
Alyssa Valentine is an 65 y.o. female.   Chief Complaint: Patient presents with left wrist fracture and median nerve contusion versus ongoing compressive issues HPI: Patient presents for open reduction internal fixation left distal radius fracture and carpal tunnel release secondary to injury process.  I saw the patient last Saturday morning to perform surgery on Sunday however she was not mentally prepared for this she states. At time I saw her this week in clinic and once again I conferred with her in regards to the diagnosis and my recommendations. She desired to proceed at that point with surgical intervention.  We now arrive today for reconstructive efforts to try and give her the best upper extremity possible   She denies neck back or chest pain  Past Medical History  Diagnosis Date  . HTN (hypertension)   . GERD (gastroesophageal reflux disease)   . Depression   . Headache     hx of migraines, no longer have them  . Arthritis   . Anemia     many years ago  . Glaucoma     Past Surgical History  Procedure Laterality Date  . Cesarean section  1985  . Rotator cuff repair Bilateral 2010 and 2013  . Carpal tunnel release Right 1990's  . Esophagogastroduodenoscopy N/A 09/15/2013    Procedure: ESOPHAGOGASTRODUODENOSCOPY (EGD);  Surgeon: Beryle Beams, MD;  Location: [facility];  Service: Endoscopy;  Laterality: N/A;  . Colonoscopy N/A 09/15/2013    Procedure: COLONOSCOPY;  Surgeon: Beryle Beams, MD;  Location: [facility];  Service: Endoscopy;  Laterality: N/A;    History reviewed. No pertinent family history. Social History:  reports that she has never smoked. She has never used smokeless tobacco. She reports that she drinks alcohol. She reports that she does not use illicit drugs.  Allergies:  Allergies  Allergen Reactions  . Sulfa Antibiotics Itching and Swelling    Medications Prior to Admission  Medication Sig Dispense Refill  . atorvastatin (LIPITOR) 40 MG  tablet Take 40 mg by mouth every evening.    . benazepril-hydrochlorthiazide (LOTENSIN HCT) 10-12.5 MG per tablet Take 1 tablet by mouth every morning.    . Calcium Carbonate-Vit D-Min (CALCIUM 1200 PO) Take 2 tablets by mouth daily.    Marland Kitchen HYDROcodone-acetaminophen (NORCO/VICODIN) 5-325 MG tablet Take 1 tablet by mouth every 6 (six) hours as needed for moderate pain. 12 tablet 0  . latanoprost (XALATAN) 0.005 % ophthalmic solution INSTILL 1 DROP IN BOTH EYES NIGHTLY  4  . sertraline (ZOLOFT) 100 MG tablet Take 200 mg by mouth daily.  3  . Multiple Vitamin (MULTIVITAMIN WITH MINERALS) TABS tablet Take 1 tablet by mouth daily.    Marland Kitchen terbinafine (LAMISIL) 250 MG tablet Take 1 tablet (250 mg total) by mouth daily. (Patient not taking: Reported on 12/28/2015) 30 tablet 2  . zolpidem (AMBIEN) 5 MG tablet Take 5 mg by mouth at bedtime as needed for sleep.      Results for orders placed or performed during the hospital encounter of 01/04/16 (from the past 48 hour(s))  Basic metabolic panel     Status: Abnormal   Collection Time: 01/04/16 10:14 AM  Result Value Ref Range   Sodium 140 135 - 145 mmol/L   Potassium 4.1 3.5 - 5.1 mmol/L   Chloride 105 101 - 111 mmol/L   CO2 23 22 - 32 mmol/L   Glucose, Bld 99 65 - 99 mg/dL   BUN 23 (H) 6 - 20 mg/dL   Creatinine, Ser  0.75 0.44 - 1.00 mg/dL   Calcium 9.6 8.9 - 10.3 mg/dL   GFR calc non Af Amer >60 >60 mL/min   GFR calc Af Amer >60 >60 mL/min    Comment: (NOTE) The eGFR has been calculated using the CKD EPI equation. This calculation has not been validated in all clinical situations. eGFR's persistently <60 mL/min signify possible Chronic Kidney Disease.    Anion gap 12 5 - 15  CBC     Status: Abnormal   Collection Time: 01/04/16 10:14 AM  Result Value Ref Range   WBC 4.4 4.0 - 10.5 K/uL   RBC 4.42 3.87 - 5.11 MIL/uL   Hemoglobin 11.6 (L) 12.0 - 15.0 g/dL   HCT 35.2 (L) 36.0 - 46.0 %   MCV 79.6 78.0 - 100.0 fL   MCH 26.2 26.0 - 34.0 pg   MCHC  33.0 30.0 - 36.0 g/dL   RDW 15.3 11.5 - 15.5 %   Platelets 201 150 - 400 K/uL   No results found.  Review of Systems  HENT: Negative.   Eyes: Negative.   Respiratory: Negative.   Gastrointestinal: Negative.   Genitourinary: Negative.   Neurological: Negative.   Endo/Heme/Allergies: Negative.     Blood pressure 128/69, pulse 90, temperature 98.3 F (36.8 C), temperature source Oral, resp. rate 16, height 5' (1.524 m), weight 66.225 kg (146 lb), SpO2 100 %. Physical Exam  Left radius fracture with associated numbness in the median nerve distribution.  I discussed these issues with her. We'll plan for reconstructive efforts.  The patient is alert and oriented in no acute distress. The patient complains of pain in the affected upper extremity.  The patient is noted to have a normal HEENT exam. Lung fields show equal chest expansion and no shortness of breath. Abdomen exam is nontender without distention. Lower extremity examination does not show any fracture dislocation or blood clot symptoms. Pelvis is stable and the neck and back are stable and nontender.  She is intact to refill Assessment/Plan Will plan for open reduction internal fixation left radius fracture and carpal tunnel release. We are planning surgery for your upper extremity. The risk and benefits of surgery to include risk of bleeding, infection, anesthesia,  damage to normal structures and failure of the surgery to accomplish its intended goals of relieving symptoms and restoring function have been discussed in detail. With this in mind we plan to proceed. I have specifically discussed with the patient the pre-and postoperative regime and the dos and don'ts and risk and benefits in great detail. Risk and benefits of surgery also include risk of dystrophy(CRPS), chronic nerve pain, failure of the healing process to go onto completion and other inherent risks of surgery The relavent the pathophysiology of the  disease/injury process, as well as the alternatives for treatment and postoperative course of action has been discussed in great detail with the patient who desires to proceed.  We will do everything in our power to help you (the patient) restore function to the upper extremity. It is a pleasure to see this patient today.  Paulene Floor, MD 01/04/2016, 1:04 PM

## 2016-01-04 NOTE — Anesthesia Procedure Notes (Signed)
Anesthesia Regional Block:  Axillary brachial plexus block  Pre-Anesthetic Checklist: ,, timeout performed, Correct Patient, Correct Site, Correct Laterality, Correct Procedure, Correct Position, site marked, Risks and benefits discussed,  Surgical consent,  Pre-op evaluation,  At surgeon's request and post-op pain management  Laterality: Upper and Left  Prep: chloraprep       Needles:  Injection technique: Single-shot  Needle Type: Echogenic Needle          Additional Needles:  Procedures: ultrasound guided (picture in chart) Axillary brachial plexus block Narrative:  Injection made incrementally with aspirations every 5 mL.  Performed by: Personally   Additional Notes: H+P and labs reviewed, risks and benefits discussed with patient, procedure tolerated well without complications      

## 2016-01-04 NOTE — Anesthesia Postprocedure Evaluation (Signed)
Anesthesia Post Note  Patient: SALAH TINES  Procedure(s) Performed: Procedure(s) (LRB): OPEN REDUCTION INTERNAL FIXATION (ORIF) LEFT RADIAL FRACTURE (Left) CARPAL TUNNEL RELEASE (Left)  Patient location during evaluation: PACU Anesthesia Type: MAC and Regional Level of consciousness: awake Pain management: pain level controlled Vital Signs Assessment: post-procedure vital signs reviewed and stable Respiratory status: spontaneous breathing Cardiovascular status: stable Postop Assessment: no signs of nausea or vomiting Anesthetic complications: no    Last Vitals:  Filed Vitals:   01/04/16 1530 01/04/16 1547  BP:  135/78  Pulse:  74  Temp: 36.2 C 36.6 C  Resp:  18    Last Pain:  Filed Vitals:   01/04/16 1553  PainSc: 0-No pain                 Tallula Grindle

## 2016-01-04 NOTE — Op Note (Signed)
See dictation#[id number] Arnold Kester M.D.

## 2016-01-04 NOTE — Progress Notes (Signed)
Patient and family notified of delay. 

## 2016-01-04 NOTE — Anesthesia Preprocedure Evaluation (Signed)
Anesthesia Evaluation  Patient identified by MRN, date of birth, ID band Patient awake    Reviewed: Allergy & Precautions, NPO status , Patient's Chart, lab work & pertinent test results  History of Anesthesia Complications Negative for: history of anesthetic complications  Airway Mallampati: II  TM Distance: >3 FB Neck ROM: Full    Dental  (+) Teeth Intact   Pulmonary neg pulmonary ROS,    breath sounds clear to auscultation       Cardiovascular hypertension, Pt. on medications  Rhythm:Regular     Neuro/Psych  Headaches, neg Seizures PSYCHIATRIC DISORDERS Depression    GI/Hepatic Neg liver ROS, GERD  Medicated and Controlled,  Endo/Other  negative endocrine ROS  Renal/GU negative Renal ROS     Musculoskeletal  (+) Arthritis ,   Abdominal   Peds  Hematology  (+) anemia ,   Anesthesia Other Findings   Reproductive/Obstetrics                             Anesthesia Physical Anesthesia Plan  ASA: II  Anesthesia Plan: MAC and Regional   Post-op Pain Management:    Induction: Intravenous  Airway Management Planned: Natural Airway, Nasal Cannula and Simple Face Mask  Additional Equipment: None  Intra-op Plan:   Post-operative Plan:   Informed Consent: I have reviewed the patients History and Physical, chart, labs and discussed the procedure including the risks, benefits and alternatives for the proposed anesthesia with the patient or authorized representative who has indicated his/her understanding and acceptance.   Dental advisory given  Plan Discussed with: CRNA and Surgeon  Anesthesia Plan Comments:         Anesthesia Quick Evaluation

## 2016-01-05 DIAGNOSIS — S52579A Other intraarticular fracture of lower end of unspecified radius, initial encounter for closed fracture: Secondary | ICD-10-CM | POA: Diagnosis not present

## 2016-01-05 MED ORDER — OXYCODONE HCL 5 MG PO TABS: 5.0000 mg | ORAL_TABLET | ORAL | Status: DC | PRN

## 2016-01-05 NOTE — Progress Notes (Signed)
Occupational Therapy Evaluation/Discharge Patient Details Name: Alyssa Valentine MRN: [id number] DOB: 07/16/1951 Today's Date: 01/05/2016    History of Present Illness 65 y.o. female s/p ORIF L radial fx and carpal tunnel release after mechanical fall. PMH significant for HTN and GERD.    Clinical Impression   Patient has been evaluated by Occupational Therapy with no acute OT needs identified. Pt completed all functional transfers and ADLs at mod I level and reported that her husband will be able to assist her with IADLs and any bilateral UE tasks. Pt demonstrated good adherence to NWB LUE status and educated pt on fall prevention and pain/edema management strategies using ice and elevation. All education has been completed and pt has no further questions. OT signing off. Thank you for this referral.    Follow Up Recommendations  No OT follow up;Supervision - Intermittent    Equipment Recommendations  None recommended by OT    Recommendations for Other Services       Precautions / Restrictions Precautions Precautions: None Restrictions Weight Bearing Restrictions: Yes LUE Weight Bearing: Non weight bearing      Mobility Bed Mobility Overal bed mobility: Modified Independent             General bed mobility comments: HOB flat, no use of bedrails and exited R side of bed to simulate home environment  Transfers Overall transfer level: Modified independent Equipment used: None             General transfer comment: No physical assist required, no LOB or reports of dizziness    Balance Overall balance assessment: No apparent balance deficits (not formally assessed)                                          ADL Overall ADL's : Modified independent                                       General ADL Comments: Educated pt on compensatort strategies for ADLs ad IADLs - pt reported her husband will be able to help her. Educated pt on  fall prevention and pain/edemea management strategies using ice and elevation.     Vision Vision Assessment?: No apparent visual deficits   Perception     Praxis      Pertinent Vitals/Pain Pain Assessment: 0-10 Pain Score: 6  Pain Location: L forearm Pain Descriptors / Indicators: Aching;Sore Pain Intervention(s): Limited activity within patient's tolerance;Monitored during session;Repositioned;Ice applied     Hand Dominance Right   Extremity/Trunk Assessment Upper Extremity Assessment Upper Extremity Assessment: LUE deficits/detail LUE Deficits / Details: decreased ROM and strength as expected post op   Lower Extremity Assessment Lower Extremity Assessment: Overall WFL for tasks assessed   Cervical / Trunk Assessment Cervical / Trunk Assessment: Normal   Communication Communication Communication: No difficulties   Cognition Arousal/Alertness: Awake/alert Behavior During Therapy: WFL for tasks assessed/performed Overall Cognitive Status: Within Functional Limits for tasks assessed                     General Comments       Exercises       Shoulder Instructions      Home Living Family/patient expects to be discharged to:: Private residence Living Arrangements: Spouse/significant other Available Help at Discharge: Family;Available 24  hours/day Type of Home: House Home Access: Stairs to enter [employer] of Steps: 5 Entrance Stairs-Rails: Left Home Layout: Multi-level;Able to live on main level with bedroom/bathroom     Bathroom Shower/Tub: Tub/shower unit;Curtain Shower/tub characteristics: Architectural technologist: Standard     Home Equipment: Environmental consultant - 2 wheels;Cane - single point          Prior Functioning/Environment Level of Independence: Independent        Comments: Not working, drives    OT Diagnosis: Acute pain   OT Problem List: Decreased range of motion;Decreased strength;Decreased coordination;Decreased safety  awareness;Decreased knowledge of use of DME or AE;Decreased knowledge of precautions;Pain;Impaired UE functional use   OT Treatment/Interventions:      OT Goals(Current goals can be found in the care plan section) Acute Rehab OT Goals Patient Stated Goal: to go home OT Goal Formulation: With patient Time For Goal Achievement: 01/19/16 Potential to Achieve Goals: Good  OT Frequency:     Barriers to D/C:            Co-evaluation              End of Session Equipment Utilized During Treatment: Gait belt Nurse Communication: Mobility status;Weight bearing status  Activity Tolerance: Patient tolerated treatment well Patient left: in bed;with call bell/phone within reach;Other (comment) (LUE elevated)   Time: [id number] OT Time Calculation (min): 21 min Charges:  OT General Charges $OT Visit: 1 Procedure OT Evaluation $OT Eval Low Complexity: 1 Procedure G-Codes: OT G-codes **NOT FOR INPATIENT CLASS** Functional Assessment Tool Used: clinical judgement Functional Limitation: Self care Self Care Current Status [id number]): At least 1 percent but less than 20 percent impaired, limited or restricted Self Care Goal Status [id number]): At least 1 percent but less than 20 percent impaired, limited or restricted Self Care Discharge Status [phone]): At least 1 percent but less than 20 percent impaired, limited or restricted  Redmond Baseman, OTR/L Pager: [phone] 01/05/2016, 10:36 AM

## 2016-01-05 NOTE — Op Note (Signed)
Alyssa Valentine, Alyssa Valentine               ACCOUNT NO.:  [id number]  MEDICAL RECORD NO.:  [id number]  LOCATION:  [facility]                         FACILITY:  [facility]  PHYSICIAN:  Satira Anis. Jill Stopka, M.D.DATE OF BIRTH:  July 27, 1951  DATE OF PROCEDURE: DATE OF DISCHARGE:                              OPERATIVE REPORT   PREOPERATIVE DIAGNOSES: 1. Comminuted complex greater than 3-part intra-articular distal     radius fracture left upper extremity. 2. Median nerve contusion rule out compressive environment about the     carpal tunnel.  POSTOPERATIVE DIAGNOSES: 1. Comminuted complex greater than 3-part intra-articular distal     radius fracture left upper extremity. 2. Median nerve contusion rule out compressive environment about the     carpal tunnel.  PROCEDURE: 1. Open reduction and internal fixation, left comminuted complex     greater than 3-part intra-articular distal radius fracture. 2. AP, lateral and oblique x-rays performed, examined, and interpreted     by myself. 3. Closed reduction and x-ray of ulna styloid fracture which was noted     to be stable without bow under anesthesia. 4. Open carpal tunnel release, left upper extremity.  SURGEON:  Satira Anis. Amedeo Plenty, M.D.  ASSISTANT:  None.  COMPLICATIONS:  None.  ANESTHESIA:  Block with IV sedation.  TOURNIQUET TIME:  Less than an hour.  DRAINS:  2, 1 for the carpal tunnel and one for the volar radial incision.  ESTIMATED BLOOD LOSS:  Minimal.  INDICATIONS FOR PROCEDURE:  This pleasant female presents with above- mentioned diagnosis.  I have counseled her regarding risks and benefits of surgery and she desires to proceed.  All questions have been encouraged and answered preoperatively.  I should note she is noted preoperatively to have the median nerve Contusive injury versus compressive phenomenon.  I saw in the emergency room after block was placed and reduction was performed by the ER staff. She noted numbness in her hand.   I have recently asked her to move forward with surgery 6 days ago.  However, she was not prepared for this mainly she states.  She presented to my office and __________ booked her for surgery for ORIF and decompression of the median nerve given the fact that she still exhibits median nerve symptoms/neuropathy.  She understands our goals to give her better wrist and hopefully give her pressure free environment for her nerve.  I have discussed the relevant issues, do's and don'ts, timeframe duration of recovery, etc.  OPERATION IN DETAIL:  The patient was seen by myself and Anesthesia, taken to operative suite, underwent smooth induction of IV sedation. Preoperative block was in excellent working fashion.  I performed a Hibiclens scrub myself followed by Betadine scrub and paint.  Sterile field was secured.  Drapes placed.  Following this, arm was elevated, tourniquet was insufflated to 250 mmHg.  The patient then underwent a very careful and cautious approach to the extremity with a volar radial incision after time-out was observed.  Volar radial approach was made. The FCR tendon sheath was incised palmarly and dorsally.  I released the superficial compartment of the forearm.  Following this, I retracted the carpal canal contents ulnarly.  The radial artery was carefully  protected and the FCR was carefully protected with radial retraction. The pronator was incised.  Following this, I evaluated the fracture. The patient had a very comminuted styloid piece as well as the distal portion of the radius.  These were pieced together, held with provisional fixation and following this, I applied a DVR standard plate. The patient tolerated this well.  I was able to get excellent radial height inclination of volar tilt with good sound fixation.  The radial styloid piece was rather comminuted that held together quite nicely with the fixation.  Thus, radial height inclination and volar tilt looked  excellent.  ORIF was accomplished with DVR plate from [employer] in the form of a cross lock plate.  Screw lengths were checked and the remaining portions of the proximal end of the plate now looked well.  I irrigated copiously with a liter of saline, closed the pronator and got a nice closure over the plate.  Following this, I turned attention toward the distal radioulnar joint.  Evaluation under anesthesia ensued.  I carefully manipulated the styloid fragment, but noted prior to doing so, the [place] appeared to be competent.  There was no gross instability.  Does simple closed reduction suffice and I did not perform ORIF of the ulnar styloid.  The radiocarpal and mid-carpal joints looked well.  She has some age related degenerative changes, but no evidence of advanced compromise or other problems.  Following this, I then carefully and cautiously performed further irrigation followed by incision for carpal tunnel release.  Incision was made 1 inch in nature at distal transverse carpal tunnel, dissection was carried down.  Palmar fascia incised.  Distal transcarpal ligament was released under 4.0 loupe magnification.  Fat pad egressed nicely. Following this and confirming complete distal release, I then dissected in a distal proximal direction, it was __________ for incision and release of a proximal __________ portions of the antebrachial fascia, which the patient tolerated this well.  Median nerve was intact, hyperemic.  There was no blood in the canal, which would suggest in favor contusive injury more so than a hematoma compressive phenomenon. I irrigated copiously, deflated the tourniquet, placed a drain in the carpal tunnel and in the volar radial wrist, irrigated once again and closed the wound with Prolene about both incisions.  The patient was dressed with Adaptic, Xeroform, gauze, Kerlix, Webril, and a sugar-tong splint.  She was taken to recovery room.  I am going to monitor in  the recovery room.  I will keep her overnight, discharge to home tomorrow after a period of [facility] care.  Going forward, we will proceed with standard postop algorithm for carpal tunnel release in my office.  These notes have been discussed and all questions have been encouraged and answered.  It is a pleasure to see her today and to participate in her care.     Satira Anis. Amedeo Plenty, M.D.     [facility]  D:  01/04/2016  T:  01/04/2016  Job:  [id number]

## 2016-01-05 NOTE — Discharge Instructions (Signed)

## 2016-01-05 NOTE — Discharge Summary (Signed)
Physician Discharge Summary  Patient ID: Alyssa Valentine MRN: [id number] DOB/AGE: 1951-01-05 65 y.o.  Admit date: 01/04/2016 Discharge date:   Admission Diagnoses: LEFT RADIUS IMPOSSIBLE ULNAR FRACTURE,CARPAL TUNNEL SYNDROME Past Medical History  Diagnosis Date  . HTN (hypertension)   . GERD (gastroesophageal reflux disease)   . Depression   . Headache     hx of migraines, no longer have them  . Arthritis   . Anemia     many years ago  . Glaucoma     Discharge Diagnoses:  Active Problems:   Radius distal fracture   Surgeries: Procedure(s): OPEN REDUCTION INTERNAL FIXATION (ORIF) LEFT RADIAL FRACTURE CARPAL TUNNEL RELEASE on 01/04/2016    Consultants:    Discharged Condition: Improved  Hospital Course: Alyssa Valentine is an 65 y.o. female who was admitted 01/04/2016 with a chief complaint of No chief complaint on file. , and found to have a diagnosis of LEFT RADIUS IMPOSSIBLE ULNAR FRACTURE,CARPAL TUNNEL SYNDROME.  They were brought to the operating room on 01/04/2016 and underwent Procedure(s): OPEN REDUCTION INTERNAL FIXATION (ORIF) LEFT RADIAL FRACTURE CARPAL TUNNEL RELEASE.    They were given perioperative antibiotics: Anti-infectives    Start     Dose/Rate Route Frequency Ordered Stop   01/05/16 0000  ceFAZolin (ANCEF) IVPB 1 g/50 mL premix     1 g 100 mL/hr over 30 Minutes Intravenous Every 8 hours 01/04/16 1546     01/04/16 1630  ceFAZolin (ANCEF) IVPB 1 g/50 mL premix     1 g 100 mL/hr over 30 Minutes Intravenous NOW 01/04/16 1546 01/05/16 1630   01/04/16 1100  ceFAZolin (ANCEF) IVPB 2 g/50 mL premix     2 g 100 mL/hr over 30 Minutes Intravenous To Short Stay 01/04/16 0926 01/04/16 1316   01/04/16 0929  ceFAZolin (ANCEF) 2-3 GM-% IVPB SOLR    Comments:  Henrine Screws   : cabinet override      01/04/16 0929 01/04/16 2144    .  They were given sequential compression devices, early ambulation.  Recent vital signs: Patient Vitals for the past 24 hrs:  BP  Temp Temp src Pulse Resp SpO2  01/05/16 0534 130/75 mmHg 100 F (37.8 C) Oral (!) 101 16 96 %  01/05/16 0011 (!) 145/83 mmHg 99 F (37.2 C) Oral (!) 103 18 92 %  01/04/16 1939 (!) 99/57 mmHg 98.3 F (36.8 C) Oral 97 16 95 %  01/04/16 1547 135/78 mmHg 97.9 F (36.6 C) Oral 74 18 96 %  01/04/16 1530 - 97.2 F (36.2 C) - - - -  01/04/16 1515 136/80 mmHg - - 75 18 100 %  01/04/16 1500 132/80 mmHg - - - - -  01/04/16 1450 137/88 mmHg 97 F (36.1 C) - 70 18 98 %  01/04/16 1130 - - - 90 16 100 %  01/04/16 1129 - - - 89 13 100 %  01/04/16 1128 - - - 89 13 100 %  01/04/16 1127 128/69 mmHg - - 89 14 100 %  01/04/16 1126 - - - 89 13 100 %  01/04/16 1125 - - - 88 12 100 %  01/04/16 1124 - - - 88 14 100 %  01/04/16 1123 - - - 87 13 100 %  01/04/16 1122 125/69 mmHg - - 87 13 100 %  01/04/16 1121 - - - 88 14 100 %  01/04/16 1120 - - - 86 13 100 %  01/04/16 1119 - - - 86 13 99 %  01/04/16 1118  132/66 mmHg - - 85 13 100 %  01/04/16 1117 - - - 83 13 100 %  01/04/16 1116 - - - 91 13 99 %  01/04/16 1115 - - - 85 15 99 %  01/04/16 1114 - - - 84 16 99 %  01/04/16 1113 (!) 192/86 mmHg - - 90 16 100 %  01/04/16 1112 - - - 92 20 97 %  01/04/16 1111 - - - 81 18 98 %  01/04/16 1110 - - - 78 16 99 %  01/04/16 1109 - - - 77 15 100 %  01/04/16 1108 135/76 mmHg - - 75 16 100 %  01/04/16 1107 - - - 74 15 100 %  01/04/16 1106 - - - 75 15 100 %  01/04/16 1105 - - - 76 15 100 %  01/04/16 1104 - - - 75 (!) 21 99 %  01/04/16 1103 - - - 77 15 100 %  01/04/16 1102 - - - 74 16 100 %  01/04/16 1101 - - - 81 16 99 %  01/04/16 1100 - - - 79 15 100 %  01/04/16 1059 - - - 77 16 100 %  .  Recent laboratory studies: No results found.  Discharge Medications:     Medication List    ASK your doctor about these medications        atorvastatin 40 MG tablet  Commonly known as:  LIPITOR  Take 40 mg by mouth every evening.     benazepril-hydrochlorthiazide 10-12.5 MG tablet  Commonly known as:  LOTENSIN HCT   Take 1 tablet by mouth every morning.     CALCIUM 1200 PO  Take 2 tablets by mouth daily.     HYDROcodone-acetaminophen 5-325 MG tablet  Commonly known as:  NORCO/VICODIN  Take 1 tablet by mouth every 6 (six) hours as needed for moderate pain.     latanoprost 0.005 % ophthalmic solution  Commonly known as:  XALATAN  INSTILL 1 DROP IN BOTH EYES NIGHTLY     multivitamin with minerals Tabs tablet  Take 1 tablet by mouth daily.     sertraline 100 MG tablet  Commonly known as:  ZOLOFT  Take 200 mg by mouth daily.     terbinafine 250 MG tablet  Commonly known as:  LAMISIL  Take 1 tablet (250 mg total) by mouth daily.     zolpidem 5 MG tablet  Commonly known as:  AMBIEN  Take 5 mg by mouth at bedtime as needed for sleep.        Diagnostic Studies: Dg Wrist Complete Left  2016-01-21  CLINICAL DATA:  Status post reduction of left wrist fracture. Initial encounter. EXAM: LEFT WRIST - COMPLETE 3+ VIEW COMPARISON:  Left wrist radiographs performed earlier today at 5:30 p.m. FINDINGS: A mildly impacted comminuted fracture of the distal radius is again noted. Alignment is improved, though there is slight residual dorsal displacement. A mildly displaced ulnar styloid fracture is also seen. The carpal rows appear grossly intact, and demonstrate normal alignment. The soft tissues are difficult to fully assess due to the surrounding splint. IMPRESSION: Improved alignment of mildly impacted comminuted fracture of the distal radius, with slight residual dorsal displacement. No significant angulation seen. Mildly displaced ulnar styloid fracture also noted. Electronically Signed   By: Garald Balding M.D.   On: 01/21/2016 23:15   Dg Wrist Complete Left  01/21/2016  CLINICAL DATA:  Fall, left wrist pain EXAM: LEFT WRIST - COMPLETE 3+ VIEW COMPARISON:  None.  FINDINGS: Comminuted distal radial fracture with dorsal displacement of the distal fracture fragments. Mildly displaced ulnar styloid fracture.  Associated soft tissue swelling/deformity. IMPRESSION: Comminuted, displaced distal radial fracture, as above. Mildly displaced ulnar styloid fracture. Electronically Signed   By: Julian Hy M.D.   On: 12/28/2015 17:39    They benefited maximally from their hospital stay and there were no complications.     Disposition: 01-Home or Self Care      Follow-up Information    Follow up with Paulene Floor, MD In 12 days.   Specialty:  Orthopedic Surgery   Contact information:   [address] [place] [address] [id number]       Patient has been seen and examined. Patient has pain appropriate to his injury/process. Patient denies new complaints at this present time. I have discussed the care pathway with nursing staff. Patient is appropriate and alert.  We reviewed vital signs and intake output which are stable.  The upper extremity is neurovascularly intact. Refill is normal. There is no signs of compartment syndrome. There is no signs of dystrophy. There is normal sensation.  I have spent a  great deal of time discussing range of motion edema control and other techniques to decrease edema and promote flexion extension of the fingers. Patient understands the importance of elevation range of motion massage and other measures to lessen pain and prevent swelling.  We have also discussed immobilization to appropriate areas involved.  We have discussed with the patient shoulder range of motion to prevent adhesive capsulitis.  The remainder of the examination is normal today without complicating feature.  Drain was removed without difficulty  Patient will be discharged home. Will plan to see the patient back in the office as per discharge instructions (please see discharge instructions).  Patient had an uneventful hospital course. At the time of discharge patient is stable awake alert and oriented in no acute distress. Regular diet will be continued and  has been tolerated. Patient will notify should have problems occur. There is no signs of DVT infection or other complication at this juncture.  All questions have been incurred and answered.  Please see discharge med list  Landa Mullinax MD Signed: Paulene Floor 01/05/2016, 10:50 AM

## 2016-01-06 ENCOUNTER — Encounter (HOSPITAL_COMMUNITY): Payer: Self-pay | Admitting: Orthopedic Surgery

## 2016-07-21 ENCOUNTER — Other Ambulatory Visit: Payer: Self-pay | Admitting: Neurosurgery

## 2016-08-17 ENCOUNTER — Encounter (HOSPITAL_COMMUNITY): Payer: Self-pay

## 2016-08-17 NOTE — Pre-Procedure Instructions (Signed)
Alyssa Valentine  08/17/2016      [id number] [address], [place] [address] Phone: [phone] Fax: [phone]    Your procedure is scheduled on Oct. 11  Report to [facility] at 600 A.M.  Call this number if you have problems the morning of surgery:  [phone]   Remember:  Do not eat food or drink liquids after midnight.  Take these medicines the morning of surgery with A SIP OF WATER Omeprazole (Prilosec), Sertraline (Zoloft)  Stop taking aspirin, BC's, Goody's, Herbal medications, Fish Oil, Aleve, Ibuprofen, Advil, Motrin   Do not wear jewelry, make-up or nail polish.  Do not wear lotions, powders, or perfumes, or deoderant.  Do not shave 48 hours prior to surgery.  Men may shave face and neck.  Do not bring valuables to the hospital.  [facility] is not responsible for any belongings or valuables.  Contacts, dentures or bridgework may not be worn into surgery.  Leave your suitcase in the car.  After surgery it may be brought to your room.  For patients admitted to the hospital, discharge time will be determined by your treatment team.  Patients discharged the day of surgery will not be allowed to drive home.    Special instructions:  [place] - Preparing for Surgery  Before surgery, you can play an important role.  Because skin is not sterile, your skin needs to be as free of germs as possible.  You can reduce the number of germs on you skin by washing with CHG (chlorahexidine gluconate) soap before surgery.  CHG is an antiseptic cleaner which kills germs and bonds with the skin to continue killing germs even after washing.  Please DO NOT use if you have an allergy to CHG or antibacterial soaps.  If your skin becomes reddened/irritated stop using the CHG and inform your nurse when you arrive at Short Stay.  Do not shave (including legs and underarms) for at least 48 hours prior  to the first CHG shower.  You may shave your face.  Please follow these instructions carefully:   1.  Shower with CHG Soap the night before surgery and the   morning of Surgery.  2.  If you choose to wash your hair, wash your hair first as usual with your normal shampoo.  3.  After you shampoo, rinse your hair and body thoroughly to remove the  Shampoo.  4.  Use CHG as you would any other liquid soap.  You can apply chg directly  to the skin and wash gently with scrungie or a clean washcloth.  5.  Apply the CHG Soap to your body ONLY FROM THE NECK DOWN.   Do not use on open wounds or open sores.  Avoid contact with your eyes,       ears, mouth and genitals (private parts).  Wash genitals (private parts) with your normal soap.  6.  Wash thoroughly, paying special attention to the area where your surgery will be performed.  7.  Thoroughly rinse your body with warm water from the neck down.  8.  DO NOT shower/wash with your normal soap after using and rinsing off  the CHG Soap.  9.  Pat yourself dry with a clean towel.            10.  Wear clean pajamas.            11.  Place clean sheets on your bed the night of your first shower and do not  sleep with pets.  Day of Surgery  Do not apply any lotions/deoderants the morning of surgery.  Please wear clean clothes to the hospital/surgery center.     Please read over the following fact sheets that you were given. Pain Booklet, Coughing and Deep Breathing, MRSA Information and Surgical Site Infection Prevention

## 2016-08-18 ENCOUNTER — Encounter (HOSPITAL_COMMUNITY): Payer: Self-pay

## 2016-08-18 ENCOUNTER — Encounter (HOSPITAL_COMMUNITY)
Admission: RE | Admit: 2016-08-18 | Discharge: 2016-08-18 | Disposition: A | Discharge status: Home / Self Care | Payer: Federal, State, Local not specified - PPO | Source: Ambulatory Visit | Attending: Neurosurgery | Admitting: Neurosurgery

## 2016-08-18 DIAGNOSIS — Z01810 Encounter for preprocedural cardiovascular examination: Secondary | ICD-10-CM | POA: Insufficient documentation

## 2016-08-18 DIAGNOSIS — Z01812 Encounter for preprocedural laboratory examination: Secondary | ICD-10-CM | POA: Diagnosis present

## 2016-08-18 DIAGNOSIS — I10 Essential (primary) hypertension: Secondary | ICD-10-CM | POA: Insufficient documentation

## 2016-08-18 HISTORY — DX: Anxiety disorder, unspecified: F41.9

## 2016-08-18 LAB — BASIC METABOLIC PANEL
Anion gap: 9 (ref 5–15)
BUN: 12 mg/dL (ref 6–20)
CALCIUM: 9.7 mg/dL (ref 8.9–10.3)
CHLORIDE: 105 mmol/L (ref 101–111)
CO2: 26 mmol/L (ref 22–32)
CREATININE: 0.82 mg/dL (ref 0.44–1.00)
Glucose, Bld: 111 mg/dL — ABNORMAL HIGH (ref 65–99)
Potassium: 3.8 mmol/L (ref 3.5–5.1)
Sodium: 140 mmol/L (ref 135–145)

## 2016-08-18 LAB — SURGICAL PCR SCREEN
MRSA, PCR: NEGATIVE
Staphylococcus aureus: NEGATIVE

## 2016-08-18 LAB — TYPE AND SCREEN
ABO/RH(D): O POS
Antibody Screen: NEGATIVE

## 2016-08-18 LAB — CBC
HCT: 36.4 % (ref 36.0–46.0)
HEMOGLOBIN: 12.1 g/dL (ref 12.0–15.0)
MCH: 26.9 pg (ref 26.0–34.0)
MCHC: 33.2 g/dL (ref 30.0–36.0)
MCV: 80.9 fL (ref 78.0–100.0)
PLATELETS: 158 10*3/uL (ref 150–400)
RBC: 4.5 MIL/uL (ref 3.87–5.11)
RDW: 15.9 % — ABNORMAL HIGH (ref 11.5–15.5)
WBC: 3.6 10*3/uL — AB (ref 4.0–10.5)

## 2016-08-18 LAB — ABO/RH: ABO/RH(D): O POS

## 2016-08-18 NOTE — Progress Notes (Addendum)
PCp is Dr. Ashby Dawes Denies ever seeing a cardiologist. Denies any recent chest pain. Denies ever having a stress test, echo, or card cath.

## 2016-08-26 ENCOUNTER — Inpatient Hospital Stay (HOSPITAL_COMMUNITY): Payer: Medicare Other

## 2016-08-26 ENCOUNTER — Inpatient Hospital Stay (HOSPITAL_COMMUNITY)
Admission: RE | Admit: 2016-08-26 | Discharge: 2016-08-28 | DRG: 454 | Disposition: A | Discharge status: Home / Self Care | Payer: Medicare Other | Source: Ambulatory Visit | Attending: Neurosurgery | Admitting: Neurosurgery

## 2016-08-26 ENCOUNTER — Encounter (HOSPITAL_COMMUNITY)
Admission: RE | Disposition: A | Discharge status: Home / Self Care | Payer: Self-pay | Source: Ambulatory Visit | Attending: Neurosurgery

## 2016-08-26 ENCOUNTER — Inpatient Hospital Stay (HOSPITAL_COMMUNITY): Payer: Medicare Other | Admitting: Certified Registered Nurse Anesthetist

## 2016-08-26 ENCOUNTER — Encounter (HOSPITAL_COMMUNITY): Payer: Self-pay | Admitting: *Deleted

## 2016-08-26 DIAGNOSIS — M5416 Radiculopathy, lumbar region: Secondary | ICD-10-CM | POA: Diagnosis present

## 2016-08-26 DIAGNOSIS — M4316 Spondylolisthesis, lumbar region: Secondary | ICD-10-CM | POA: Diagnosis present

## 2016-08-26 DIAGNOSIS — D62 Acute posthemorrhagic anemia: Secondary | ICD-10-CM | POA: Diagnosis not present

## 2016-08-26 DIAGNOSIS — K219 Gastro-esophageal reflux disease without esophagitis: Secondary | ICD-10-CM | POA: Diagnosis not present

## 2016-08-26 DIAGNOSIS — Z882 Allergy status to sulfonamides status: Secondary | ICD-10-CM | POA: Diagnosis not present

## 2016-08-26 DIAGNOSIS — Z419 Encounter for procedure for purposes other than remedying health state, unspecified: Secondary | ICD-10-CM

## 2016-08-26 DIAGNOSIS — H409 Unspecified glaucoma: Secondary | ICD-10-CM | POA: Diagnosis present

## 2016-08-26 DIAGNOSIS — M48062 Spinal stenosis, lumbar region with neurogenic claudication: Secondary | ICD-10-CM | POA: Diagnosis not present

## 2016-08-26 DIAGNOSIS — F419 Anxiety disorder, unspecified: Secondary | ICD-10-CM | POA: Diagnosis present

## 2016-08-26 DIAGNOSIS — R262 Difficulty in walking, not elsewhere classified: Secondary | ICD-10-CM

## 2016-08-26 DIAGNOSIS — Z79899 Other long term (current) drug therapy: Secondary | ICD-10-CM

## 2016-08-26 DIAGNOSIS — I10 Essential (primary) hypertension: Secondary | ICD-10-CM | POA: Diagnosis not present

## 2016-08-26 DIAGNOSIS — M419 Scoliosis, unspecified: Secondary | ICD-10-CM | POA: Diagnosis not present

## 2016-08-26 DIAGNOSIS — E876 Hypokalemia: Secondary | ICD-10-CM | POA: Diagnosis not present

## 2016-08-26 SURGERY — POSTERIOR LUMBAR FUSION 1 LEVEL
Anesthesia: General | Site: Spine Lumbar

## 2016-08-26 MED ORDER — PANTOPRAZOLE SODIUM 40 MG PO TBEC
40.0000 mg | DELAYED_RELEASE_TABLET | Freq: Every day | ORAL | Status: DC
  Administered 2016-08-26 – 2016-08-28 (×3): 40 mg via ORAL
  Filled 2016-08-26 (×3): qty 1

## 2016-08-26 MED ORDER — CEFAZOLIN SODIUM-DEXTROSE 2-4 GM/100ML-% IV SOLN
2.0000 g | INTRAVENOUS | Status: AC
  Administered 2016-08-26 (×2): 2 g via INTRAVENOUS
  Filled 2016-08-26: qty 100

## 2016-08-26 MED ORDER — BENAZEPRIL-HYDROCHLOROTHIAZIDE 10-12.5 MG PO TABS: 1.0000 | ORAL_TABLET | Freq: Every morning | ORAL | Status: DC

## 2016-08-26 MED ORDER — THROMBIN 20000 UNITS EX SOLR
CUTANEOUS | Status: DC | PRN
  Administered 2016-08-26: 10:00:00 via TOPICAL

## 2016-08-26 MED ORDER — ONDANSETRON HCL 4 MG/2ML IJ SOLN
INTRAMUSCULAR | Status: AC
  Filled 2016-08-26: qty 2

## 2016-08-26 MED ORDER — GLYCOPYRROLATE 0.2 MG/ML IV SOSY
PREFILLED_SYRINGE | INTRAVENOUS | Status: AC
  Filled 2016-08-26: qty 3

## 2016-08-26 MED ORDER — MENTHOL 3 MG MT LOZG: 1.0000 | LOZENGE | OROMUCOSAL | Status: DC | PRN

## 2016-08-26 MED ORDER — BACITRACIN ZINC 500 UNIT/GM EX OINT
TOPICAL_OINTMENT | CUTANEOUS | Status: AC
  Filled 2016-08-26: qty 28.35

## 2016-08-26 MED ORDER — PHENYLEPHRINE 40 MCG/ML (10ML) SYRINGE FOR IV PUSH (FOR BLOOD PRESSURE SUPPORT)
PREFILLED_SYRINGE | INTRAVENOUS | Status: AC
  Filled 2016-08-26: qty 10

## 2016-08-26 MED ORDER — PHENYLEPHRINE HCL 10 MG/ML IJ SOLN
INTRAMUSCULAR | Status: AC
  Filled 2016-08-26: qty 1

## 2016-08-26 MED ORDER — ZOLPIDEM TARTRATE 5 MG PO TABS
5.0000 mg | ORAL_TABLET | Freq: Every evening | ORAL | Status: DC | PRN
  Administered 2016-08-26: 5 mg via ORAL
  Filled 2016-08-26: qty 1

## 2016-08-26 MED ORDER — FENTANYL CITRATE (PF) 100 MCG/2ML IJ SOLN
INTRAMUSCULAR | Status: DC | PRN
  Administered 2016-08-26: 50 ug via INTRAVENOUS
  Administered 2016-08-26: 250 ug via INTRAVENOUS
  Administered 2016-08-26 (×2): 50 ug via INTRAVENOUS

## 2016-08-26 MED ORDER — ONDANSETRON HCL 4 MG/2ML IJ SOLN: 4.0000 mg | INTRAMUSCULAR | Status: DC | PRN

## 2016-08-26 MED ORDER — ONDANSETRON HCL 4 MG/2ML IJ SOLN
INTRAMUSCULAR | Status: DC | PRN
  Administered 2016-08-26: 4 mg via INTRAVENOUS

## 2016-08-26 MED ORDER — BUPIVACAINE LIPOSOME 1.3 % IJ SUSP
20.0000 mL | INTRAMUSCULAR | Status: AC
  Administered 2016-08-26: 20 mL
  Filled 2016-08-26: qty 20

## 2016-08-26 MED ORDER — ALUM & MAG HYDROXIDE-SIMETH 200-200-20 MG/5ML PO SUSP: 30.0000 mL | Freq: Four times a day (QID) | ORAL | Status: DC | PRN

## 2016-08-26 MED ORDER — PHENOL 1.4 % MT LIQD
1.0000 | OROMUCOSAL | Status: DC | PRN
  Filled 2016-08-26: qty 177

## 2016-08-26 MED ORDER — MORPHINE SULFATE (PF) 2 MG/ML IV SOLN: 1.0000 mg | INTRAVENOUS | Status: DC | PRN

## 2016-08-26 MED ORDER — SODIUM CHLORIDE 0.9 % IR SOLN
Status: DC | PRN
  Administered 2016-08-26: 10:00:00

## 2016-08-26 MED ORDER — PHENYLEPHRINE HCL 10 MG/ML IJ SOLN
INTRAVENOUS | Status: DC | PRN
  Administered 2016-08-26: 10 ug/min via INTRAVENOUS

## 2016-08-26 MED ORDER — MEPERIDINE HCL 25 MG/ML IJ SOLN: 6.2500 mg | INTRAMUSCULAR | Status: DC | PRN

## 2016-08-26 MED ORDER — LATANOPROST 0.005 % OP SOLN
1.0000 [drp] | Freq: Every day | OPHTHALMIC | Status: DC
  Administered 2016-08-26 – 2016-08-27 (×2): 1 [drp] via OPHTHALMIC
  Filled 2016-08-26: qty 2.5

## 2016-08-26 MED ORDER — CHLORHEXIDINE GLUCONATE CLOTH 2 % EX PADS: 6.0000 | MEDICATED_PAD | Freq: Once | CUTANEOUS | Status: DC

## 2016-08-26 MED ORDER — ATORVASTATIN CALCIUM 20 MG PO TABS
40.0000 mg | ORAL_TABLET | Freq: Every evening | ORAL | Status: DC
  Administered 2016-08-26 – 2016-08-27 (×2): 40 mg via ORAL
  Filled 2016-08-26 (×2): qty 2

## 2016-08-26 MED ORDER — LIDOCAINE 2% (20 MG/ML) 5 ML SYRINGE
INTRAMUSCULAR | Status: AC
  Filled 2016-08-26: qty 5

## 2016-08-26 MED ORDER — VANCOMYCIN HCL 1000 MG IV SOLR
INTRAVENOUS | Status: AC
  Filled 2016-08-26: qty 1000

## 2016-08-26 MED ORDER — SUCCINYLCHOLINE CHLORIDE 200 MG/10ML IV SOSY
PREFILLED_SYRINGE | INTRAVENOUS | Status: AC
  Filled 2016-08-26: qty 10

## 2016-08-26 MED ORDER — CEFAZOLIN SODIUM-DEXTROSE 2-4 GM/100ML-% IV SOLN
2.0000 g | Freq: Three times a day (TID) | INTRAVENOUS | Status: AC
  Administered 2016-08-26 – 2016-08-27 (×2): 2 g via INTRAVENOUS
  Filled 2016-08-26 (×2): qty 100

## 2016-08-26 MED ORDER — BENAZEPRIL HCL 10 MG PO TABS
10.0000 mg | ORAL_TABLET | Freq: Every day | ORAL | Status: DC
  Administered 2016-08-26: 10 mg via ORAL
  Filled 2016-08-26 (×3): qty 1

## 2016-08-26 MED ORDER — LIDOCAINE HCL (CARDIAC) 20 MG/ML IV SOLN
INTRAVENOUS | Status: DC | PRN
  Administered 2016-08-26: 100 mg via INTRAVENOUS

## 2016-08-26 MED ORDER — SERTRALINE HCL 50 MG PO TABS
200.0000 mg | ORAL_TABLET | Freq: Every day | ORAL | Status: DC
  Administered 2016-08-26 – 2016-08-28 (×3): 200 mg via ORAL
  Filled 2016-08-26 (×3): qty 4

## 2016-08-26 MED ORDER — BISACODYL 10 MG RE SUPP: 10.0000 mg | Freq: Every day | RECTAL | Status: DC | PRN

## 2016-08-26 MED ORDER — ACETAMINOPHEN 650 MG RE SUPP: 650.0000 mg | RECTAL | Status: DC | PRN

## 2016-08-26 MED ORDER — ROCURONIUM BROMIDE 100 MG/10ML IV SOLN
INTRAVENOUS | Status: DC | PRN
  Administered 2016-08-26: 50 mg via INTRAVENOUS
  Administered 2016-08-26: 20 mg via INTRAVENOUS

## 2016-08-26 MED ORDER — THROMBIN 20000 UNITS EX SOLR
CUTANEOUS | Status: AC
  Filled 2016-08-26: qty 20000

## 2016-08-26 MED ORDER — LACTATED RINGERS IV SOLN
INTRAVENOUS | Status: DC
  Administered 2016-08-26 (×2): via INTRAVENOUS

## 2016-08-26 MED ORDER — MIDAZOLAM HCL 2 MG/2ML IJ SOLN
INTRAMUSCULAR | Status: AC
  Filled 2016-08-26: qty 2

## 2016-08-26 MED ORDER — HYDROMORPHONE HCL 1 MG/ML IJ SOLN
0.2500 mg | INTRAMUSCULAR | Status: DC | PRN
  Administered 2016-08-26 (×2): 0.5 mg via INTRAVENOUS

## 2016-08-26 MED ORDER — EPHEDRINE 5 MG/ML INJ
INTRAVENOUS | Status: AC
  Filled 2016-08-26: qty 10

## 2016-08-26 MED ORDER — DIAZEPAM 5 MG PO TABS
5.0000 mg | ORAL_TABLET | Freq: Four times a day (QID) | ORAL | Status: DC | PRN
  Administered 2016-08-26 – 2016-08-28 (×4): 5 mg via ORAL
  Filled 2016-08-26 (×4): qty 1

## 2016-08-26 MED ORDER — ARTIFICIAL TEARS OP OINT
TOPICAL_OINTMENT | OPHTHALMIC | Status: DC | PRN
  Administered 2016-08-26: 1 via OPHTHALMIC

## 2016-08-26 MED ORDER — HYDROCHLOROTHIAZIDE 12.5 MG PO CAPS
12.5000 mg | ORAL_CAPSULE | Freq: Every day | ORAL | Status: DC
  Administered 2016-08-26: 12.5 mg via ORAL
  Filled 2016-08-26: qty 1

## 2016-08-26 MED ORDER — NEOSTIGMINE METHYLSULFATE 5 MG/5ML IV SOSY
PREFILLED_SYRINGE | INTRAVENOUS | Status: AC
  Filled 2016-08-26: qty 5

## 2016-08-26 MED ORDER — OXYCODONE-ACETAMINOPHEN 5-325 MG PO TABS
1.0000 | ORAL_TABLET | ORAL | Status: DC | PRN
  Administered 2016-08-26 (×2): 2 via ORAL
  Administered 2016-08-27 (×2): 1 via ORAL
  Administered 2016-08-27 – 2016-08-28 (×5): 2 via ORAL
  Filled 2016-08-26: qty 2
  Filled 2016-08-26 (×2): qty 1
  Filled 2016-08-26 (×6): qty 2

## 2016-08-26 MED ORDER — ROCURONIUM BROMIDE 10 MG/ML (PF) SYRINGE
PREFILLED_SYRINGE | INTRAVENOUS | Status: AC
  Filled 2016-08-26: qty 10

## 2016-08-26 MED ORDER — HYDROMORPHONE HCL 1 MG/ML IJ SOLN
INTRAMUSCULAR | Status: AC
  Filled 2016-08-26: qty 1

## 2016-08-26 MED ORDER — SUGAMMADEX SODIUM 200 MG/2ML IV SOLN
INTRAVENOUS | Status: AC
Start: 2016-08-26 — End: 2016-08-26
  Filled 2016-08-26: qty 2

## 2016-08-26 MED ORDER — LACTATED RINGERS IV SOLN: INTRAVENOUS | Status: DC

## 2016-08-26 MED ORDER — PROPOFOL 10 MG/ML IV BOLUS
INTRAVENOUS | Status: AC
  Filled 2016-08-26: qty 40

## 2016-08-26 MED ORDER — ACETAMINOPHEN 325 MG PO TABS
650.0000 mg | ORAL_TABLET | ORAL | Status: DC | PRN
  Administered 2016-08-27: 325 mg via ORAL
  Filled 2016-08-26: qty 2

## 2016-08-26 MED ORDER — ONDANSETRON HCL 4 MG/2ML IJ SOLN: 4.0000 mg | Freq: Once | INTRAMUSCULAR | Status: DC | PRN

## 2016-08-26 MED ORDER — VANCOMYCIN HCL 1000 MG IV SOLR
INTRAVENOUS | Status: DC | PRN
  Administered 2016-08-26: 1000 mg

## 2016-08-26 MED ORDER — 0.9 % SODIUM CHLORIDE (POUR BTL) OPTIME
TOPICAL | Status: DC | PRN
  Administered 2016-08-26: 1000 mL

## 2016-08-26 MED ORDER — POTASSIUM CHLORIDE CRYS ER 20 MEQ PO TBCR: 20.0000 meq | EXTENDED_RELEASE_TABLET | Freq: Every day | ORAL | Status: DC

## 2016-08-26 MED ORDER — MIDAZOLAM HCL 5 MG/5ML IJ SOLN
INTRAMUSCULAR | Status: DC | PRN
  Administered 2016-08-26 (×2): 1 mg via INTRAVENOUS

## 2016-08-26 MED ORDER — BUPIVACAINE-EPINEPHRINE (PF) 0.5% -1:200000 IJ SOLN
INTRAMUSCULAR | Status: DC | PRN
  Administered 2016-08-26: 10 mL

## 2016-08-26 MED ORDER — DOCUSATE SODIUM 100 MG PO CAPS
100.0000 mg | ORAL_CAPSULE | Freq: Two times a day (BID) | ORAL | Status: DC
  Administered 2016-08-26 – 2016-08-28 (×4): 100 mg via ORAL
  Filled 2016-08-26 (×4): qty 1

## 2016-08-26 MED ORDER — PROPOFOL 10 MG/ML IV BOLUS
INTRAVENOUS | Status: DC | PRN
  Administered 2016-08-26: 100 mg via INTRAVENOUS

## 2016-08-26 MED ORDER — BUPIVACAINE-EPINEPHRINE (PF) 0.5% -1:200000 IJ SOLN
INTRAMUSCULAR | Status: AC
  Filled 2016-08-26: qty 30

## 2016-08-26 MED ORDER — FENTANYL CITRATE (PF) 100 MCG/2ML IJ SOLN
INTRAMUSCULAR | Status: AC
  Filled 2016-08-26: qty 2

## 2016-08-26 MED ORDER — HYDROCODONE-ACETAMINOPHEN 5-325 MG PO TABS: 1.0000 | ORAL_TABLET | ORAL | Status: DC | PRN

## 2016-08-26 MED ORDER — FENTANYL CITRATE (PF) 100 MCG/2ML IJ SOLN
INTRAMUSCULAR | Status: AC
  Filled 2016-08-26: qty 6

## 2016-08-26 MED ORDER — SUGAMMADEX SODIUM 200 MG/2ML IV SOLN
INTRAVENOUS | Status: DC | PRN
  Administered 2016-08-26: 140 mg via INTRAVENOUS

## 2016-08-26 MED ORDER — BACITRACIN ZINC 500 UNIT/GM EX OINT
TOPICAL_OINTMENT | CUTANEOUS | Status: DC | PRN
  Administered 2016-08-26: 1 via TOPICAL

## 2016-08-26 SURGICAL SUPPLY — 64 items
BAG DECANTER FOR FLEXI CONT (MISCELLANEOUS) ×2 IMPLANT
BENZOIN TINCTURE PRP APPL 2/3 (GAUZE/BANDAGES/DRESSINGS) ×2 IMPLANT
BLADE CLIPPER SURG (BLADE) IMPLANT
BUR MATCHSTICK NEURO 3.0 LAGG (BURR) ×2 IMPLANT
BUR PRECISION FLUTE 6.0 (BURR) ×2 IMPLANT
CAGE ALTERA 10X31X9-13 15D (Cage) ×2 IMPLANT
CANISTER SUCT 3000ML PPV (MISCELLANEOUS) ×2 IMPLANT
CAP REVERE LOCKING (Cap) ×8 IMPLANT
CONT SPEC 4OZ CLIKSEAL STRL BL (MISCELLANEOUS) ×2 IMPLANT
COVER BACK TABLE 60X90IN (DRAPES) ×2 IMPLANT
COVER TABLE BACK 60X90 (DRAPES) ×2 IMPLANT
DRAPE C-ARM 42X72 X-RAY (DRAPES) ×4 IMPLANT
DRAPE HALF SHEET 40X57 (DRAPES) ×4 IMPLANT
DRAPE LAPAROTOMY 100X72X124 (DRAPES) ×2 IMPLANT
DRAPE POUCH INSTRU U-SHP 10X18 (DRAPES) ×2 IMPLANT
DRAPE SURG 17X23 STRL (DRAPES) ×8 IMPLANT
ELECT BLADE 4.0 EZ CLEAN MEGAD (MISCELLANEOUS) ×2
ELECT REM PT RETURN 9FT ADLT (ELECTROSURGICAL) ×2
ELECTRODE BLDE 4.0 EZ CLN MEGD (MISCELLANEOUS) ×1 IMPLANT
ELECTRODE REM PT RTRN 9FT ADLT (ELECTROSURGICAL) ×1 IMPLANT
EVACUATOR 1/8 PVC DRAIN (DRAIN) IMPLANT
GAUZE SPONGE 4X4 12PLY STRL (GAUZE/BANDAGES/DRESSINGS) ×2 IMPLANT
GAUZE SPONGE 4X4 16PLY XRAY LF (GAUZE/BANDAGES/DRESSINGS) ×2 IMPLANT
GLOVE BIO SURGEON STRL SZ8 (GLOVE) ×4 IMPLANT
GLOVE BIO SURGEON STRL SZ8.5 (GLOVE) ×4 IMPLANT
GLOVE BIOGEL PI IND STRL 7.0 (GLOVE) ×2 IMPLANT
GLOVE BIOGEL PI IND STRL 7.5 (GLOVE) ×2 IMPLANT
GLOVE BIOGEL PI IND STRL 8 (GLOVE) ×1 IMPLANT
GLOVE BIOGEL PI IND STRL 8.5 (GLOVE) ×1 IMPLANT
GLOVE BIOGEL PI INDICATOR 7.0 (GLOVE) ×2
GLOVE BIOGEL PI INDICATOR 7.5 (GLOVE) ×2
GLOVE BIOGEL PI INDICATOR 8 (GLOVE) ×1
GLOVE BIOGEL PI INDICATOR 8.5 (GLOVE) ×1
GLOVE SURG SS PI 7.0 STRL IVOR (GLOVE) ×4 IMPLANT
GOWN STRL REUS W/ TWL LRG LVL3 (GOWN DISPOSABLE) ×2 IMPLANT
GOWN STRL REUS W/ TWL XL LVL3 (GOWN DISPOSABLE) ×2 IMPLANT
GOWN STRL REUS W/TWL 2XL LVL3 (GOWN DISPOSABLE) IMPLANT
GOWN STRL REUS W/TWL LRG LVL3 (GOWN DISPOSABLE) ×2
GOWN STRL REUS W/TWL XL LVL3 (GOWN DISPOSABLE) ×2
KIT BASIN OR (CUSTOM PROCEDURE TRAY) ×2 IMPLANT
KIT ROOM TURNOVER OR (KITS) ×2 IMPLANT
NEEDLE HYPO 21X1.5 SAFETY (NEEDLE) ×2 IMPLANT
NEEDLE HYPO 22GX1.5 SAFETY (NEEDLE) ×2 IMPLANT
NS IRRIG 1000ML POUR BTL (IV SOLUTION) ×2 IMPLANT
PACK LAMINECTOMY NEURO (CUSTOM PROCEDURE TRAY) ×2 IMPLANT
PAD ARMBOARD 7.5X6 YLW CONV (MISCELLANEOUS) ×6 IMPLANT
PATTIES SURGICAL .5 X1 (DISPOSABLE) IMPLANT
ROD REVERE 6.35 40MM (Rod) ×2 IMPLANT
ROD REVERE CURVED 6.35X35MM (Rod) ×2 IMPLANT
SCREW REVERE 5.5X50 (Screw) ×4 IMPLANT
SCREW REVERE 6.35 5.5X40MM (Screw) ×4 IMPLANT
SPONGE LAP 4X18 X RAY DECT (DISPOSABLE) IMPLANT
SPONGE NEURO XRAY DETECT 1X3 (DISPOSABLE) IMPLANT
SPONGE SURGIFOAM ABS GEL 100 (HEMOSTASIS) ×2 IMPLANT
STRIP BIOACTIVE 20CC 25X100X8 (Miscellaneous) ×2 IMPLANT
STRIP CLOSURE SKIN 1/2X4 (GAUZE/BANDAGES/DRESSINGS) ×2 IMPLANT
SUT VIC AB 1 CT1 18XBRD ANBCTR (SUTURE) ×2 IMPLANT
SUT VIC AB 1 CT1 8-18 (SUTURE) ×2
SUT VIC AB 2-0 CP2 18 (SUTURE) ×4 IMPLANT
TAPE CLOTH SURG 4X10 WHT LF (GAUZE/BANDAGES/DRESSINGS) ×2 IMPLANT
TOWEL OR 17X24 6PK STRL BLUE (TOWEL DISPOSABLE) ×2 IMPLANT
TOWEL OR 17X26 10 PK STRL BLUE (TOWEL DISPOSABLE) ×2 IMPLANT
TRAY FOLEY W/METER SILVER 16FR (SET/KITS/TRAYS/PACK) ×2 IMPLANT
WATER STERILE IRR 1000ML POUR (IV SOLUTION) ×2 IMPLANT

## 2016-08-26 NOTE — Anesthesia Procedure Notes (Signed)
Procedure Name: Intubation Date/Time: 08/26/2016 9:35 AM Performed by: Willeen Cass P Pre-anesthesia Checklist: Patient identified, Emergency Drugs available, Suction available, Patient being monitored and Timeout performed Patient Re-evaluated:Patient Re-evaluated prior to inductionOxygen Delivery Method: Circle system utilized Preoxygenation: Pre-oxygenation with 100% oxygen Intubation Type: IV induction Ventilation: Mask ventilation without difficulty Laryngoscope Size: Mac and 3 Grade View: Grade I Tube type: Oral Tube size: 7.0 mm Number of attempts: 1 Airway Equipment and Method: Stylet Placement Confirmation: ETT inserted through vocal cords under direct vision,  positive ETCO2 and breath sounds checked- equal and bilateral Secured at: 22 cm Tube secured with: Tape

## 2016-08-26 NOTE — Anesthesia Preprocedure Evaluation (Addendum)
Anesthesia Evaluation  Patient identified by MRN, date of birth, ID band Patient awake    Reviewed: Allergy & Precautions, NPO status , Patient's Chart, lab work & pertinent test results  Airway Mallampati: I  TM Distance: >3 FB Neck ROM: Full    Dental  (+) Teeth Intact, Dental Advisory Given   Pulmonary    Pulmonary exam normal        Cardiovascular hypertension, Pt. on medications Normal cardiovascular exam     Neuro/Psych Anxiety Depression    GI/Hepatic GERD  Medicated and Controlled,  Endo/Other    Renal/GU      Musculoskeletal   Abdominal   Peds  Hematology   Anesthesia Other Findings   Reproductive/Obstetrics                            Anesthesia Physical Anesthesia Plan  ASA: II  Anesthesia Plan: General   Post-op Pain Management:    Induction: Intravenous  Airway Management Planned: Oral ETT  Additional Equipment:   Intra-op Plan:   Post-operative Plan: Extubation in OR  Informed Consent: I have reviewed the patients History and Physical, chart, labs and discussed the procedure including the risks, benefits and alternatives for the proposed anesthesia with the patient or authorized representative who has indicated his/her understanding and acceptance.     Plan Discussed with: CRNA and Surgeon  Anesthesia Plan Comments:         Anesthesia Quick Evaluation

## 2016-08-26 NOTE — H&P (Signed)
Subjective: The patient is a 65 year old black female who has complained of back and leg pain consistent with neurogenic claudication. She has failed medical management and was worked up for lumbar x-rays in the lumbar MRI. This demonstrated an L4-5 spondylolisthesis with spinal stenosis. I discussed the various treatment options with the patient. She has decided to proceed with an L4-5 decompression, instrumentation, and fusion after weighing the risks, benefits, and alternatives to surgery.   Past Medical History:  Diagnosis Date  . Anemia    many years ago  . Anxiety   . Arthritis   . Depression   . GERD (gastroesophageal reflux disease)   . Glaucoma   . Headache    hx of migraines, no longer have them  . HTN (hypertension)     Past Surgical History:  Procedure Laterality Date  . CARPAL TUNNEL RELEASE Right 1990's  . CARPAL TUNNEL RELEASE Left 01/04/2016   Procedure: CARPAL TUNNEL RELEASE;  Surgeon: Roseanne Kaufman, MD;  Location: [facility];  Service: Orthopedics;  Laterality: Left;  . CESAREAN SECTION  1985  . COLONOSCOPY N/A 09/15/2013   Procedure: COLONOSCOPY;  Surgeon: Beryle Beams, MD;  Location: [facility];  Service: Endoscopy;  Laterality: N/A;  . ESOPHAGOGASTRODUODENOSCOPY N/A 09/15/2013   Procedure: ESOPHAGOGASTRODUODENOSCOPY (EGD);  Surgeon: Beryle Beams, MD;  Location: [facility];  Service: Endoscopy;  Laterality: N/A;  . ORIF RADIAL FRACTURE Left 01/04/2016   Procedure: OPEN REDUCTION INTERNAL FIXATION (ORIF) LEFT RADIAL FRACTURE;  Surgeon: Roseanne Kaufman, MD;  Location: [facility];  Service: Orthopedics;  Laterality: Left;  . ROTATOR CUFF REPAIR Bilateral 2010 and 2013    Allergies  Allergen Reactions  . Sulfa Antibiotics Itching and Swelling    Social History  Substance Use Topics  . Smoking status: Never Smoker  . Smokeless tobacco: Never Used  . Alcohol use Yes     Comment: occasional    History reviewed. No pertinent family history. Prior to Admission  medications   Medication Sig Start Date End Date Taking? Authorizing Provider  atorvastatin (LIPITOR) 40 MG tablet Take 40 mg by mouth every evening.   Yes Historical Provider, MD  benazepril-hydrochlorthiazide (LOTENSIN HCT) 10-12.5 MG per tablet Take 1 tablet by mouth every morning.   Yes Historical Provider, MD  Calcium Carbonate-Vit D-Min (CALCIUM 1200 PO) Take 2 tablets by mouth daily.   Yes Historical Provider, MD  KLOR-CON M20 20 MEQ tablet Take 20 mEq by mouth daily. 07/24/16  Yes Historical Provider, MD  latanoprost (XALATAN) 0.005 % ophthalmic solution INSTILL 1 DROP IN BOTH EYES NIGHTLY 12/09/15  Yes Historical Provider, MD  Multiple Vitamin (MULTIVITAMIN WITH MINERALS) TABS tablet Take 1 tablet by mouth daily.   Yes Historical Provider, MD  omeprazole (PRILOSEC) 20 MG capsule Take 20 mg by mouth daily as needed. For acid reflux. 05/08/16  Yes Historical Provider, MD  sertraline (ZOLOFT) 100 MG tablet Take 200 mg by mouth daily. 12/14/15  Yes Historical Provider, MD  zolpidem (AMBIEN) 5 MG tablet Take 5 mg by mouth at bedtime as needed for sleep.    Historical Provider, MD     Review of Systems  Positive ROS: As above  All other systems have been reviewed and were otherwise negative with the exception of those mentioned in the HPI and as above.  Objective: Vital signs in last 24 hours: Temp:  [98 F (36.7 C)] 98 F (36.7 C) (10/11 0728) Pulse Rate:  [85] 85 (10/11 0728) Resp:  [20] 20 (10/11 0728) BP: (153)/(97) 153/97 (10/11  [id number]) SpO2:  [97 %] 97 % (10/11 0728) Weight:  [67.6 kg (149 lb)] 67.6 kg (149 lb) (10/11 0728)  General Appearance: Alert, cooperative, no distress, Head: Normocephalic, without obvious abnormality, atraumatic Eyes: PERRL, conjunctiva/corneas clear, EOM's intact,    Ears: Normal  Throat: Normal  Neck: Supple, symmetrical, trachea midline, no adenopathy; thyroid: No enlargement/tenderness/nodules; no carotid bruit or JVD Back: Symmetric, no curvature,  ROM normal, no CVA tenderness Lungs: Clear to auscultation bilaterally, respirations unlabored Heart: Regular rate and rhythm, no murmur, rub or gallop Abdomen: Soft, non-tender,, no masses, no organomegaly Extremities: Extremities normal, atraumatic, no cyanosis or edema Pulses: 2+ and symmetric all extremities Skin: Skin color, texture, turgor normal, no rashes or lesions  NEUROLOGIC:   Mental status: alert and oriented, no aphasia, good attention span, Fund of knowledge/ memory ok Motor Exam - grossly normal Sensory Exam - grossly normal Reflexes:  Coordination - grossly normal Gait - grossly normal Balance - grossly normal Cranial Nerves: I: smell Not tested  II: visual acuity  OS: Normal  OD: Normal   II: visual fields Full to confrontation  II: pupils Equal, round, reactive to light  III,VII: ptosis None  III,IV,VI: extraocular muscles  Full ROM  V: mastication Normal  V: facial light touch sensation  Normal  V,VII: corneal reflex  Present  VII: facial muscle function - upper  Normal  VII: facial muscle function - lower Normal  VIII: hearing Not tested  IX: soft palate elevation  Normal  IX,X: gag reflex Present  XI: trapezius strength  5/5  XI: sternocleidomastoid strength 5/5  XI: neck flexion strength  5/5  XII: tongue strength  Normal    Data Review Lab Results  Component Value Date   WBC 3.6 (L) 08/18/2016   HGB 12.1 08/18/2016   HCT 36.4 08/18/2016   MCV 80.9 08/18/2016   PLT 158 08/18/2016   Lab Results  Component Value Date   NA 140 08/18/2016   K 3.8 08/18/2016   CL 105 08/18/2016   CO2 26 08/18/2016   BUN 12 08/18/2016   CREATININE 0.82 08/18/2016   GLUCOSE 111 (H) 08/18/2016   No results found for: INR, PROTIME  Assessment/Plan: L4-5 spondylolisthesis, spinal stenosis, lumbar radiculopathy, lumbago, neurogenic claudication: I discussed the situation with the patient. I have reviewed her imaging studies with her and pointed out the  abnormalities. We have discussed the various treatment options including surgery. I described the surgical treatment option of an L4-5 decompression, instrumentation, infusion. I have shown her surgical models. We have discussed the risks, benefits, alternatives, expected postoperative course, and likelihood of achieving our goals with surgery. I have answered all her questions. She has decided to proceed with surgery.   Letti Towell D 08/26/2016 9:17 AM

## 2016-08-26 NOTE — Op Note (Signed)
Brief history: The patient is a 65 year old black female who has complained of back and leg pain consistent with neurogenic claudication. She has failed medical management and was worked up with a lumbar MRI and lumbar x-rays. This demonstrated a thoracolumbar scoliosis with an L4-5 spondylosis listhesis and severe foraminal stenosis/facet arthropathy. I discussed the various treatment options with patient. She has decided to proceed with surgery after weighing the risks, benefits, and alternatives.  Preoperative diagnosis: L4-5 facet arthropathy,  spinal stenosis compressing both the L4 and the L5 nerve roots; lumbago; lumbar radiculopathy; thoracolumbar scoliosis  Postoperative diagnosis: The same   Procedure: L4 laminectomy/foraminotomies to decompress the bilateral L4 and L5 nerve roots(the work required to do this was in addition to the work required to do the posterior lumbar interbody fusion because of the patient's spinal stenosis, facet arthropathy. Etc. requiring a wide decompression of the nerve roots.); L4-5 transforaminal lumbar interbody fusion with local morselized autograft bone and Kinnex graft extender; insertion of interbody prosthesis at L4-5 (globus peek expandable interbody prosthesis); posterior nonsegmental instrumentation from L4 to L5 with globus titanium pedicle screws and rods; posterior lateral arthrodesis at L4-5 with local morselized autograft bone and Kinnex bone graft extender.  Surgeon: Dr. Earle Gell  Asst.: Dr. Sherley Bounds  Anesthesia: Gen. endotracheal  Estimated blood loss: 300 mL  Drains: None  Complications: None  Description of procedure: The patient was brought to the operating room by the anesthesia team. General endotracheal anesthesia was induced. The patient was turned to the prone position on the Wilson frame. The patient's lumbosacral region was then prepared with Betadine scrub and Betadine solution. Sterile drapes were applied.  I then  injected the area to be incised with Marcaine with epinephrine solution. I then used the scalpel to make a linear midline incision over the L4-5 interspace. I then used electrocautery to perform a bilateral subperiosteal dissection exposing the spinous process and lamina of L4 and L5. We then obtained intraoperative radiograph to confirm our location. We then inserted the Verstrac retractor to provide exposure. I incised the interspinous ligament at L3-4 and L4-5 with a scalpel. I used a Leksell rongeur to remove the L4 spinous process.  I began the decompression by using the high speed drill to perform laminotomies at L4-5 bilaterally. We then used the Kerrison punches to widen the laminotomy and removed the ligamentum flavum at L3-4 and L4-5 bilaterally. We used the Kerrison punches to remove the medial facets at L4-5 bilaterally. We performed wide foraminotomies about the bilateral L4 and L5 nerve roots completing the decompression.  We now turned our attention to the posterior lumbar interbody fusion. I used a scalpel to incise the intervertebral disc at L4-5 bilaterally. I then performed a partial intervertebral discectomy at L4-5 bilaterally using the pituitary forceps. We prepared the vertebral endplates at 075-GRM bilaterally for the fusion by removing the soft tissues with the curettes. We then used the trial spacers to pick the appropriate sized interbody prosthesis. We prefilled his prosthesis with a combination of local morselized autograft bone that we obtained during the decompression as well as Kinnex bone graft extender. We inserted the prefilled prosthesis into the interspace at L4-5, we then expanded the prosthesis. There was a good snug fit of the prosthesis in the interspace. We then filled and the remainder of the intervertebral disc space with local morselized autograft bone and Kinnex. This completed the posterior lumbar interbody arthrodesis.  We now turned attention to the  instrumentation. Under fluoroscopic guidance we cannulated the  bilateral L4 and L5 pedicles with the bone probe. We then removed the bone probe. We then tapped the pedicle with a 4.5 millimeter tap. We then removed the tap. We probed inside the tapped pedicle with a ball probe to rule out cortical breaches. We then inserted a 5.5 x 40 and 50 millimeter pedicle screw into the L4 and L5 pedicles bilaterally under fluoroscopic guidance. We then palpated along the medial aspect of the pedicles to rule out cortical breaches. There were none. The nerve roots were not injured. We then connected the unilateral pedicle screws with a lordotic rod. We compressed the construct and secured the rod in place with the caps. We then tightened the caps appropriately. This completed the instrumentation from L4-5.  We now turned our attention to the posterior lateral arthrodesis at L4-5. We used the high-speed drill to decorticate the remainder of the facets, pars, transverse process at L4-5. We then applied a combination of local morselized autograft bone and Kinnex bone graft extender over these decorticated posterior lateral structures. This completed the posterior lateral arthrodesis.  We then obtained hemostasis using bipolar electrocautery. We irrigated the wound out with bacitracin solution. We inspected the thecal sac and nerve roots and noted they were well decompressed. We then removed the retractor. We placed vancomycin powder in the wound. We reapproximated patient's thoracolumbar fascia with interrupted #1 Vicryl suture. We reapproximated patient's subcutaneous tissue with interrupted 2-0 Vicryl suture. The reapproximated patient's skin with Steri-Strips and benzoin. The wound was then coated with bacitracin ointment. A sterile dressing was applied. The drapes were removed. The patient was subsequently returned to the supine position where they were extubated by the anesthesia team. He was then transported to the post  anesthesia care unit in stable condition. All sponge instrument and needle counts were reportedly correct at the end of this case.

## 2016-08-26 NOTE — Anesthesia Postprocedure Evaluation (Signed)
Anesthesia Post Note  Patient: Alyssa Valentine  Procedure(s) Performed: Procedure(s) (LRB): LUMBAR FOUR-FIVE POSTERIOR LUMBAR INTERBODY FUSION, INTERBODY PROSTHESIS, POSTERIOR LATERAL ARTHRODESIS,POSTERIOR NON-SEGMENTAL INSTRUMENTATION (N/A)  Patient location during evaluation: PACU Anesthesia Type: General Level of consciousness: awake and alert Pain management: pain level controlled Vital Signs Assessment: post-procedure vital signs reviewed and stable Respiratory status: spontaneous breathing, nonlabored ventilation, respiratory function stable and patient connected to nasal cannula oxygen Cardiovascular status: blood pressure returned to baseline and stable Postop Assessment: no signs of nausea or vomiting Anesthetic complications: no    Last Vitals:  Vitals:   08/26/16 1445 08/26/16 1515  BP:    Pulse: 88 85  Resp: 19 14  Temp:      Last Pain:  Vitals:   08/26/16 1445  TempSrc:   PainSc: Asleep                 Makahla Kiser DAVID

## 2016-08-26 NOTE — Transfer of Care (Signed)
Immediate Anesthesia Transfer of Care Note  Patient: [name]  Procedure(s) Performed: Procedure(s) with comments: LUMBAR FOUR-FIVE POSTERIOR LUMBAR INTERBODY FUSION, INTERBODY PROSTHESIS, POSTERIOR LATERAL ARTHRODESIS,POSTERIOR NON-SEGMENTAL INSTRUMENTATION (N/A) - POSTERIOR LUMBAR INTERBODY FUSION, INTERBODY PROSTHESIS, POSTERIOR LATERAL ARTHRODESIS,POSTERIOR NON-SEGMENTAL INSTRUMENTATION L4-L5  Patient Location: PACU  Anesthesia Type:General  Level of Consciousness: awake, alert , oriented and patient cooperative  Airway & Oxygen Therapy: Patient Spontanous Breathing and Patient connected to nasal cannula oxygen  Post-op Assessment: Report given to RN, Post -op Vital signs reviewed and stable and Patient moving all extremities X 4  Post vital signs: Reviewed and stable  Last Vitals:  Vitals:   08/26/16 0728  BP: (!) 153/97  Pulse: 85  Resp: 20  Temp: 36.7 C    Last Pain:  Vitals:   08/26/16 0814  TempSrc:   PainSc: 8          Complications: No apparent anesthesia complications

## 2016-08-27 LAB — CBC
HEMATOCRIT: 26.9 % — AB (ref 36.0–46.0)
HEMOGLOBIN: 8.8 g/dL — AB (ref 12.0–15.0)
MCH: 26.7 pg (ref 26.0–34.0)
MCHC: 32.7 g/dL (ref 30.0–36.0)
MCV: 81.8 fL (ref 78.0–100.0)
Platelets: 120 10*3/uL — ABNORMAL LOW (ref 150–400)
RBC: 3.29 MIL/uL — ABNORMAL LOW (ref 3.87–5.11)
RDW: 16.4 % — AB (ref 11.5–15.5)
WBC: 4.8 10*3/uL (ref 4.0–10.5)

## 2016-08-27 LAB — BASIC METABOLIC PANEL
ANION GAP: 5 (ref 5–15)
BUN: 14 mg/dL (ref 6–20)
CALCIUM: 8.5 mg/dL — AB (ref 8.9–10.3)
CHLORIDE: 107 mmol/L (ref 101–111)
CO2: 26 mmol/L (ref 22–32)
Creatinine, Ser: 0.83 mg/dL (ref 0.44–1.00)
GFR calc non Af Amer: >60 mL/min (ref >60)
GLUCOSE: 122 mg/dL — AB (ref 65–99)
POTASSIUM: 3.4 mmol/L — AB (ref 3.5–5.1)
Sodium: 138 mmol/L (ref 135–145)

## 2016-08-27 MED ORDER — POTASSIUM CHLORIDE CRYS ER 20 MEQ PO TBCR
40.0000 meq | EXTENDED_RELEASE_TABLET | Freq: Every day | ORAL | Status: DC
  Administered 2016-08-27 – 2016-08-28 (×2): 40 meq via ORAL
  Filled 2016-08-27 (×2): qty 2

## 2016-08-27 MED FILL — Sodium Chloride IV Soln 0.9%: INTRAVENOUS | Qty: 1000 | Status: AC

## 2016-08-27 MED FILL — Heparin Sodium (Porcine) Inj 1000 Unit/ML: INTRAMUSCULAR | Qty: 30 | Status: AC

## 2016-08-27 NOTE — Evaluation (Signed)
Physical Therapy Evaluation Patient Details Name: Alyssa Valentine MRN: [id number] DOB: [birth year] Today's Date: 08/27/2016   History of Present Illness  65 yo admitted for PLIF L4-5. PMHx: ORIF left radius, HTN, GERD, anxiety, glaucoma  Clinical Impression  Pt very pleasant with difficulty moving for scooting in chair and required assist for standing as well as RW for gait and stability. Pt with decreased strength, function, transfers and gait who will benefit from acute therapy to maximize mobility, independence and adherence to precautions. Pt provided handout and educated for all restrictions with gait limited to 50'. Recommend daily mobility with nursing assist.     Follow Up Recommendations Home health PT    Equipment Recommendations  Rolling walker with 5" wheels;3in1 (PT)    Recommendations for Other Services OT consult     Precautions / Restrictions Precautions Precautions: Fall;Back Precaution Booklet Issued: Yes (comment) Required Braces or Orthoses: Spinal Brace Spinal Brace: Lumbar corset;Applied in sitting position      Mobility  Bed Mobility Overal bed mobility: Needs Assistance Bed Mobility: Sit to Sidelying;Sidelying to Sit   Sidelying to sit: Min assist     Sit to sidelying: Min assist General bed mobility comments: cues for sequence, precautions and assist to elevate trunk  Transfers Overall transfer level: Needs assistance   Transfers: Sit to/from Stand Sit to Stand: Min assist         General transfer comment: cues for sequence, assist to scoot to edge of chair, assist to rise from surface  Ambulation/Gait Ambulation/Gait assistance: Min assist Ambulation Distance (Feet): 50 Feet Assistive device: Rolling walker (2 wheeled) Gait Pattern/deviations: Step-through pattern;Decreased stride length;Trunk flexed   Gait velocity interpretation: Below normal speed for age/gender General Gait Details: cues for posture, position in RW and  safety  Stairs Stairs: Yes Stairs assistance: Min guard Stair Management: One rail Left;Sideways Number of Stairs: 4 General stair comments: cues for sequence, pt ascended 2 steps forward and 2 steps sideways with rail   Wheelchair Mobility    Modified Rankin (Stroke Patients Only)       Balance Overall balance assessment: Needs assistance;History of Falls   Sitting balance-Leahy Scale: Good       Standing balance-Leahy Scale: Poor                 High Level Balance Comments: pt reports 2 falls in the last year             Pertinent Vitals/Pain Pain Assessment: 0-10 Pain Score: 5  Pain Location: incisional pain Pain Descriptors / Indicators: Sore Pain Intervention(s): Limited activity within patient's tolerance;Monitored during session    Home Living Family/patient expects to be discharged to:: Private residence Living Arrangements: Spouse/significant other Available Help at Discharge: Family;Available 24 hours/day Type of Home: House Home Access: Stairs to enter Entrance Stairs-Rails: Left Entrance Stairs-Number of Steps: 5 Home Layout: Multi-level;Able to live on main level with bedroom/bathroom Home Equipment: Kasandra Knudsen - single point      Prior Function Level of Independence: Independent               Hand Dominance   Dominant Hand: Right    Extremity/Trunk Assessment   Upper Extremity Assessment: Generalized weakness           Lower Extremity Assessment: Generalized weakness      Cervical / Trunk Assessment: Kyphotic  Communication   Communication: No difficulties  Cognition Arousal/Alertness: Awake/alert Behavior During Therapy: WFL for tasks assessed/performed Overall Cognitive Status: Within Functional Limits for tasks assessed  Memory: Decreased recall of precautions              General Comments      Exercises     Assessment/Plan    PT Assessment Patient needs continued PT services  PT Problem List  Decreased strength;Decreased mobility;Decreased activity tolerance;Decreased balance;Decreased knowledge of use of DME;Decreased knowledge of precautions;Pain          PT Treatment Interventions Gait training;DME instruction;Stair training;Functional mobility training;Therapeutic exercise;Therapeutic activities;Patient/family education    PT Goals (Current goals can be found in the Care Plan section)  Acute Rehab PT Goals Patient Stated Goal: be able to walk PT Goal Formulation: With patient Time For Goal Achievement: 09/03/16 Potential to Achieve Goals: Good    Frequency Min 5X/week   Barriers to discharge Inaccessible home environment      Co-evaluation               End of Session Equipment Utilized During Treatment: Gait belt;Back brace Activity Tolerance: Patient tolerated treatment well Patient left: in chair;with call bell/phone within reach Nurse Communication: Mobility status;Precautions         Time: [id number] PT Time Calculation (min) (ACUTE ONLY): 22 min   Charges:   PT Evaluation $PT Eval Moderate Complexity: 1 Procedure     PT G CodesMelford Aase 08/27/2016, 9:08 AM Elwyn Reach, [place]

## 2016-08-27 NOTE — Progress Notes (Signed)
Patient ID: Alyssa Valentine, female   DOB: 12/05/1950, 65 y.o.   MRN: [id number] Subjective:  The patient is alert and pleasant. She looks well. She is in no apparent distress. She is appropriately sore.  Objective: Vital signs in last 24 hours: Temp:  [96.9 F (36.1 C)-99.7 F (37.6 C)] 99.7 F (37.6 C) (10/12 0400) Pulse Rate:  [80-100] 95 (10/12 0400) Resp:  [14-24] 18 (10/12 0400) BP: (89-131)/(58-92) 90/58 (10/12 0400) SpO2:  [94 %-100 %] 97 % (10/12 0400)  Intake/Output from previous day: 10/11 0701 - 10/12 0700 In: 2180 [P.O.:480; I.V.:1700] Out: 1780 [Urine:1530; Blood:250] Intake/Output this shift: No intake/output data recorded.  Physical exam the patient is alert and pleasant. She is moving her lower extremities well.  Lab Results:  Recent Labs  08/27/16 0258  WBC 4.8  HGB 8.8*  HCT 26.9*  PLT 120*   BMET  Recent Labs  08/27/16 0258  NA 138  K 3.4*  CL 107  CO2 26  GLUCOSE 122*  BUN 14  CREATININE 0.83  CALCIUM 8.5*    Studies/Results: Dg Lumbar Spine 2-3 Views  Result Date: 08/26/2016 CLINICAL DATA:  Posterior lumbar fusion at L4-5. EXAM: DG C-ARM 61-120 MIN; LUMBAR SPINE - 2-3 VIEW COMPARISON:  None. FINDINGS: Based on the lowest interspace there were pedicle screws placed at L4 and L5 with intervertebral cage. No evidence of intraoperative fracture or cage malpositioning. IMPRESSION: Fluoroscopy for L4-5 PLIF. Electronically Signed   By: Monte Fantasia M.D.   On: 08/26/2016 13:44   Dg Lumbar Spine 2-3 Views  Result Date: 08/26/2016 CLINICAL DATA:  Portable spine localization imaging for lumbar spine surgery. EXAM: LUMBAR SPINE - 2-3 VIEW COMPARISON:  Lumbar MRI, 04/01/2016 FINDINGS: A surgical probe has been inserted from a posterior approach. The tip projects 2.4 cm posterior to the posterior margin of the L5-S1 disc. IMPRESSION: Surgical localization imaging as described. Electronically Signed   By: Lajean Manes M.D.   On: 08/26/2016 11:16    Dg C-arm 1-60 Min  Result Date: 08/26/2016 CLINICAL DATA:  Posterior lumbar fusion at L4-5. EXAM: DG C-ARM 61-120 MIN; LUMBAR SPINE - 2-3 VIEW COMPARISON:  None. FINDINGS: Based on the lowest interspace there were pedicle screws placed at L4 and L5 with intervertebral cage. No evidence of intraoperative fracture or cage malpositioning. IMPRESSION: Fluoroscopy for L4-5 PLIF. Electronically Signed   By: Monte Fantasia M.D.   On: 08/26/2016 13:44    Assessment/Plan: The patient is doing well. We will continue to mobilize her with PT. She will likely go home tomorrow.  Acute blood loss anemia: She is clinically stable.  Hypokalemia: I will give her potassium.  LOS: 1 day     Mckensey Berghuis D 08/27/2016, 7:48 AM

## 2016-08-28 MED ORDER — CYCLOBENZAPRINE HCL 10 MG PO TABS: 10.0000 mg | ORAL_TABLET | Freq: Three times a day (TID) | ORAL | Status: DC | PRN

## 2016-08-28 MED ORDER — OXYCODONE-ACETAMINOPHEN 5-325 MG PO TABS: 1.0000 | ORAL_TABLET | ORAL | 0 refills | Status: DC | PRN

## 2016-08-28 MED ORDER — CYCLOBENZAPRINE HCL 10 MG PO TABS: 10.0000 mg | ORAL_TABLET | Freq: Three times a day (TID) | ORAL | 1 refills | Status: DC | PRN

## 2016-08-28 MED ORDER — DOCUSATE SODIUM 100 MG PO CAPS: 100.0000 mg | ORAL_CAPSULE | Freq: Two times a day (BID) | ORAL | 0 refills | Status: DC

## 2016-08-28 NOTE — Discharge Summary (Signed)
  Physician Discharge Summary  Patient ID: Alyssa Valentine MRN: [id number] DOB/AGE: [birth year] 65 y.o.  Admit date: 08/26/2016 Discharge date: 08/28/2016  Admission Diagnoses:L4-5 spondylolisthesis, spinal stenosis, lumbar radiculopathy, neurogenic claudication  Discharge Diagnoses: The same Active Problems:   Spondylolisthesis of lumbar region   Discharged Condition: good  Hospital Course: I performed an L4-5 decompression, instrumentation, and fusion on the patient on 08/26/2016. The surgery went well.  The patient's postoperative course was unremarkable. She was mobilized with physical therapy. The plan is to send her home tomorrow. She was given written and oral discharge instructions. All her questions were answered.  Consults: Physical therapy Significant Diagnostic Studies: None Treatments: L4-5 decompression, is rotation, and fusion. Discharge Exam: Blood pressure 123/73, pulse (!) 105, temperature 98.5 F (36.9 C), temperature source Oral, resp. rate 18, weight 67.6 kg (149 lb), SpO2 97 %. The patient is alert and pleasant. She looks well. She is moving her lower extremities well.  Disposition: Home     Medication List    TAKE these medications   atorvastatin 40 MG tablet Commonly known as:  LIPITOR Take 40 mg by mouth every evening.   benazepril-hydrochlorthiazide 10-12.5 MG tablet Commonly known as:  LOTENSIN HCT Take 1 tablet by mouth every morning.   CALCIUM 1200 PO Take 2 tablets by mouth daily.   cyclobenzaprine 10 MG tablet Commonly known as:  FLEXERIL Take 1 tablet (10 mg total) by mouth 3 (three) times daily as needed for muscle spasms.   docusate sodium 100 MG capsule Commonly known as:  COLACE Take 1 capsule (100 mg total) by mouth 2 (two) times daily.   KLOR-CON M20 20 MEQ tablet Generic drug:  potassium chloride SA Take 20 mEq by mouth daily.   latanoprost 0.005 % ophthalmic solution Commonly known as:  XALATAN INSTILL 1 DROP IN BOTH  EYES NIGHTLY   multivitamin with minerals Tabs tablet Take 1 tablet by mouth daily.   omeprazole 20 MG capsule Commonly known as:  PRILOSEC Take 20 mg by mouth daily as needed. For acid reflux.   oxyCODONE-acetaminophen 5-325 MG tablet Commonly known as:  PERCOCET/ROXICET Take 1-2 tablets by mouth every 4 (four) hours as needed for moderate pain.   sertraline 100 MG tablet Commonly known as:  ZOLOFT Take 200 mg by mouth daily.   zolpidem 5 MG tablet Commonly known as:  AMBIEN Take 5 mg by mouth at bedtime as needed for sleep.        SignedOphelia Charter 08/28/2016, 7:47 AM

## 2016-08-28 NOTE — Progress Notes (Signed)
Physical Therapy Treatment Patient Details Name: Alyssa Valentine MRN: [id number] DOB: October 10, 1951 Today's Date: 08/28/2016    History of Present Illness 65 yo admitted for PLIF L4-5. PMHx: ORIF left radius, HTN, GERD, anxiety, glaucoma    PT Comments    Pt moves very slowly, but with good safety.  Pt needs continued cueing for back precautions and posture with mobility.  Feel pt is ready for D/C to home from PT stand point.    Follow Up Recommendations  Home health PT;Supervision - Intermittent     Equipment Recommendations  Rolling walker with 5" wheels;3in1 (PT)    Recommendations for Other Services       Precautions / Restrictions Precautions Precautions: Fall;Back Precaution Comments: Reviewed back precautions Required Braces or Orthoses: Spinal Brace Spinal Brace: Lumbar corset;Applied in sitting position Restrictions Weight Bearing Restrictions: No    Mobility  Bed Mobility               General bed mobility comments: pt sitting in recliner  Transfers Overall transfer level: Needs assistance Equipment used: Rolling walker (2 wheeled) Transfers: Sit to/from Stand Sit to Stand: Supervision         General transfer comment: pt needs increased time and cues to scoot to edge of chair prior to standing.  pt with uncontrolled descent to sitting.    Ambulation/Gait Ambulation/Gait assistance: Supervision Ambulation Distance (Feet): 200 Feet Assistive device: Rolling walker (2 wheeled) Gait Pattern/deviations: Step-through pattern;Decreased stride length     General Gait Details: pt moves very slowly and needs cues for mroe upright posture as she tends to lean on RW.  pt able to increase ambulation distance.     Stairs         General stair comments: pt denied need for practicing stairs again.  Wheelchair Mobility    Modified Rankin (Stroke Patients Only)       Balance Overall balance assessment: Needs assistance Sitting-balance support: No  upper extremity supported;Feet supported Sitting balance-Leahy Scale: Good     Standing balance support: No upper extremity supported;During functional activity Standing balance-Leahy Scale: Fair                      Cognition Arousal/Alertness: Awake/alert Behavior During Therapy: WFL for tasks assessed/performed Overall Cognitive Status: Within Functional Limits for tasks assessed                      Exercises      General Comments        Pertinent Vitals/Pain Pain Assessment: 0-10 Pain Score: 5  Pain Location: Back Pain Descriptors / Indicators: Sore Pain Intervention(s): Monitored during session;Premedicated before session;Repositioned    Home Living                      Prior Function            PT Goals (current goals can now be found in the care plan section) Acute Rehab PT Goals Patient Stated Goal: be able to walk PT Goal Formulation: With patient Time For Goal Achievement: 09/03/16 Potential to Achieve Goals: Good Progress towards PT goals: Progressing toward goals    Frequency    Min 5X/week      PT Plan Current plan remains appropriate    Co-evaluation             End of Session Equipment Utilized During Treatment: Gait belt;Back brace Activity Tolerance: Patient tolerated treatment well Patient left: in chair;with  call bell/phone within reach     Time: 1016-1042 PT Time Calculation (min) (ACUTE ONLY): 26 min  Charges:  $Gait Training: 23-37 mins                    G CodesCatarina Hartshorn, [place] 08/28/2016, 10:49 AM

## 2016-08-28 NOTE — Progress Notes (Signed)
Patient alert and oriented, mae's well, voiding adequate amount of urine, swallowing without difficulty, c/o moderate pain and meds given prior to discharged for ride and discomfort. Patient discharged home with family. Script and discharged instructions given to patient. Patient and family stated understanding of instructions given.   

## 2019-04-27 ENCOUNTER — Encounter (HOSPITAL_BASED_OUTPATIENT_CLINIC_OR_DEPARTMENT_OTHER): Payer: Medicare Other | Attending: Internal Medicine

## 2019-04-27 DIAGNOSIS — W19XXXA Unspecified fall, initial encounter: Secondary | ICD-10-CM | POA: Diagnosis not present

## 2019-04-27 DIAGNOSIS — L97112 Non-pressure chronic ulcer of right thigh with fat layer exposed: Secondary | ICD-10-CM | POA: Insufficient documentation

## 2019-04-27 DIAGNOSIS — S7011XA Contusion of right thigh, initial encounter: Secondary | ICD-10-CM | POA: Insufficient documentation

## 2019-04-27 DIAGNOSIS — I10 Essential (primary) hypertension: Secondary | ICD-10-CM | POA: Diagnosis not present

## 2019-05-04 DIAGNOSIS — L97112 Non-pressure chronic ulcer of right thigh with fat layer exposed: Secondary | ICD-10-CM | POA: Diagnosis not present

## 2019-05-11 DIAGNOSIS — L97112 Non-pressure chronic ulcer of right thigh with fat layer exposed: Secondary | ICD-10-CM | POA: Diagnosis not present

## 2019-05-12 ENCOUNTER — Other Ambulatory Visit (HOSPITAL_COMMUNITY)
Admission: RE | Admit: 2019-05-12 | Discharge: 2019-05-12 | Disposition: A | Discharge status: Home / Self Care | Payer: Federal, State, Local not specified - PPO | Source: Ambulatory Visit | Attending: Internal Medicine | Admitting: Internal Medicine

## 2019-05-12 DIAGNOSIS — L97118 Non-pressure chronic ulcer of right thigh with other specified severity: Secondary | ICD-10-CM | POA: Insufficient documentation

## 2019-05-14 LAB — AEROBIC CULTURE W GRAM STAIN (SUPERFICIAL SPECIMEN)

## 2019-05-14 LAB — AEROBIC CULTURE? (SUPERFICIAL SPECIMEN)

## 2019-05-18 ENCOUNTER — Encounter (HOSPITAL_BASED_OUTPATIENT_CLINIC_OR_DEPARTMENT_OTHER): Payer: Medicare Other | Attending: Internal Medicine

## 2019-05-18 DIAGNOSIS — L97115 Non-pressure chronic ulcer of right thigh with muscle involvement without evidence of necrosis: Secondary | ICD-10-CM | POA: Insufficient documentation

## 2019-05-18 DIAGNOSIS — I10 Essential (primary) hypertension: Secondary | ICD-10-CM | POA: Insufficient documentation

## 2019-05-26 DIAGNOSIS — L97115 Non-pressure chronic ulcer of right thigh with muscle involvement without evidence of necrosis: Secondary | ICD-10-CM | POA: Diagnosis not present

## 2019-06-02 DIAGNOSIS — L97115 Non-pressure chronic ulcer of right thigh with muscle involvement without evidence of necrosis: Secondary | ICD-10-CM | POA: Diagnosis not present

## 2019-06-09 DIAGNOSIS — L97115 Non-pressure chronic ulcer of right thigh with muscle involvement without evidence of necrosis: Secondary | ICD-10-CM | POA: Diagnosis not present

## 2019-06-16 DIAGNOSIS — L97115 Non-pressure chronic ulcer of right thigh with muscle involvement without evidence of necrosis: Secondary | ICD-10-CM | POA: Diagnosis not present

## 2019-06-23 ENCOUNTER — Encounter (HOSPITAL_BASED_OUTPATIENT_CLINIC_OR_DEPARTMENT_OTHER): Payer: Medicare Other | Attending: Internal Medicine

## 2019-06-23 DIAGNOSIS — L97112 Non-pressure chronic ulcer of right thigh with fat layer exposed: Secondary | ICD-10-CM | POA: Insufficient documentation

## 2019-06-23 DIAGNOSIS — I10 Essential (primary) hypertension: Secondary | ICD-10-CM | POA: Diagnosis not present

## 2019-07-07 DIAGNOSIS — L97112 Non-pressure chronic ulcer of right thigh with fat layer exposed: Secondary | ICD-10-CM | POA: Diagnosis not present

## 2019-07-21 ENCOUNTER — Encounter (HOSPITAL_BASED_OUTPATIENT_CLINIC_OR_DEPARTMENT_OTHER): Payer: Medicare Other | Attending: Internal Medicine

## 2019-07-21 DIAGNOSIS — I10 Essential (primary) hypertension: Secondary | ICD-10-CM | POA: Insufficient documentation

## 2019-07-21 DIAGNOSIS — L97112 Non-pressure chronic ulcer of right thigh with fat layer exposed: Secondary | ICD-10-CM | POA: Insufficient documentation

## 2019-08-04 DIAGNOSIS — L97112 Non-pressure chronic ulcer of right thigh with fat layer exposed: Secondary | ICD-10-CM | POA: Diagnosis not present

## 2019-08-18 ENCOUNTER — Other Ambulatory Visit: Payer: Self-pay

## 2019-08-18 ENCOUNTER — Encounter (HOSPITAL_BASED_OUTPATIENT_CLINIC_OR_DEPARTMENT_OTHER): Payer: Medicare Other | Attending: Internal Medicine | Admitting: Internal Medicine

## 2019-08-18 DIAGNOSIS — L97112 Non-pressure chronic ulcer of right thigh with fat layer exposed: Secondary | ICD-10-CM | POA: Diagnosis not present

## 2019-08-18 NOTE — Progress Notes (Signed)
Alyssa Valentine, Alyssa Valentine (PB:[id number]) Visit Report for 08/18/2019 HPI Details Patient Name: Date of Service: Alyssa Valentine, Alyssa Valentine 08/18/2019 11:00 AM Medical Record Number:[id number] Patient Account Number: [id number] Date of Birth/Sex: Treating RN: [birth year] (68 y.o. Alyssa Valentine Primary Care Provider: Merrilee Seashore Other Clinician: Referring Provider: Treating Provider/Extender:Emelyn Roen, Reggy Eye, Leonidas Romberg in Treatment: 16 History of Present Illness HPI Description: ADMISSION 04/27/2019 This is a 68 year old woman who tells me she had a fall in her sister's home on May 14. She struck the inside of her leg on a tree stump. She developed a large amount of swelling. She saw her primary physician on 04/21/2019 he noted it was a wound and some surrounding erythema. No real comment about the swelling around it that I can see. It was noted that there was purulent drainage she was given doxycycline and an appointment was made for her to come here. She finished the doxycycline recently she has been applying Neosporin. She is not on anticoagulants or antiplatelet agents. She does however take NSAIDs Past medical history; L4-L5 lumbar fusion, left radial fracture, depression, vertigo and falls 6/18; right medial thigh just above the knee. Almost surprisingly the hematoma has held together. Has a large necrotic surface over the top but that is still intact. The area is much softer and there is clearly been resorption of the hematoma. There is no evidence that this is infected. 6/25; the area opened about 3 days ago. Patient comes to the clinic with a mild fever. She denies dysuria or cough. The wound is draining necrotic material. 7/2; the patient comes with the open wound perhaps slight Lee more swollen superiorly and inferiorly. Mild tenderness. Culture I did last week showed multiple organisms none predominated. She has not been on antibiotics. 7/10; any degree of infection the  patient has seems to have resolved. There is no surrounding tenderness. Still a substantial open wound however there is a right reasonably good looking tissue. She has of the bridged area between the 2 areas. We have been using silver sorb gel wet-to-dry 7/17 the wound looks very healthy. 2 cm of bridging inferiorly. There was confusion about the wound VAC when it arrived at her home nevertheless she was able to get this and we put this on today. 7/24; wound looks remarkably healthy 1 cm of undermining inferiorly which is about half the size of last week. She is tolerating the wound VAC well 7/31; wound looks remarkably healthy. Initially a hematoma. She has been using the wound VAC. The inferior tunneling has resolved. My current thought is that we will use the wound VAC for another week before changing the silver collagen alone 8/7-Wound looks really good, will not need the wound VAC anymore. There is no tunneling left the wound bed is looking completely granulating and healthy 8/21; wound VAC was discontinued last week the patient is using silver collagen and foam. We are making good improvements of the surface area 9/4; 2-week follow-up. The patient is doing well with silver collagen and bordered foam she is changing the dressing herself. We have nice improvement in surface area 9/18; 2-week follow-up. The patient has 2 small open areas in the shape of the figure 8 with the middle part epithelialized. She is changing the dressing herself using silver collagen and foam. 10/2; 2-week follow-up. The patient has 1 remaining area which is not fully closed although it is very close. She has been using silver collagen. We will change this to alginate today and a border foam however  back next week to celebrate the closure of this Electronic Signature(s) Signed: 08/18/2019 6:19:43 PM By: Linton Ham MD Entered By: Linton Ham on 08/18/2019  12:30:47 -------------------------------------------------------------------------------- Physical Exam Details Patient Name: Date of Service: Alyssa Valentine 08/18/2019 11:00 AM Medical Record Number:[id number] Patient Account Number: [id number] Date of Birth/Sex: Treating RN: 1951/02/14 (68 y.o. Alyssa Valentine Primary Care Provider: Merrilee Seashore Other Clinician: Referring Provider: Treating Provider/Extender:Anette Barra, Reggy Eye, Leonidas Romberg in Treatment: 16 Notes Wound exam; right medial thigh. Only one small open area remains. And this is superficial. Should be closed by next week there is no evidence of surrounding infection no edema Electronic Signature(s) Signed: 08/18/2019 6:19:43 PM By: Linton Ham MD Entered By: Linton Ham on 08/18/2019 12:31:24 -------------------------------------------------------------------------------- Physician Orders Details Patient Name: Date of Service: Alyssa Valentine 08/18/2019 11:00 AM Medical Record Number:[id number] Patient Account Number: [id number] Date of Birth/Sex: Treating RN: Jan 13, 1951 (68 y.o. Alyssa Valentine Primary Care Provider: Merrilee Seashore Other Clinician: Referring Provider: Treating Provider/Extender:Penelope Fittro, Reggy Eye, Leonidas Romberg in Treatment: [id number] Verbal / Phone Orders: No Diagnosis Coding Follow-up Appointments Return Appointment in 1 week. Dressing Change Frequency Wound #1 Right,Medial Upper Leg Change dressing three times week. Wound Cleansing May shower and wash wound with soap and water. Primary Wound Dressing Wound #1 Right,Medial Upper Leg Calcium Alginate with Silver Secondary Dressing Wound #1 Right,Medial Upper Leg Foam Border - or large bandaid Edema Control Avoid standing for long periods of time Elevate legs to the level of the heart or above for 30 minutes daily and/or when sitting, a frequency of: - elevate leg with pillow behind the right knee  while resting in chair and bed. Electronic Signature(s) Signed: 08/18/2019 6:19:43 PM By: Linton Ham MD Signed: 08/18/2019 7:05:19 PM By: Baruch Gouty RN, BSN Entered By: Baruch Gouty on 08/18/2019 11:55:29 -------------------------------------------------------------------------------- Problem List Details Patient Name: Date of Service: Alyssa Valentine 08/18/2019 11:00 AM Medical Record Number:[id number] Patient Account Number: [id number] Date of Birth/Sex: Treating RN: 20-Jul-1951 (68 y.o. 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 Alyssa Valentine Primary Care Provider: Merrilee Seashore Other Clinician: Referring Provider: Treating Provider/Extender:Cheyane Ayon, Reggy Eye, Leonidas Romberg in Treatment: 16 Subjective History of Present Illness (HPI) ADMISSION 04/27/2019 This is a 68 year old woman who tells me she had a fall in her sister's home on May 14. She struck the inside of her leg on a tree stump. She developed a large amount of swelling. She saw her primary physician on 04/21/2019 he noted it was a wound and  some surrounding erythema. No real comment about the swelling around it that I can see. It was noted that there was purulent drainage she was given doxycycline and an appointment was made for her to come here. She finished the doxycycline recently she has been applying Neosporin. She is not on anticoagulants or antiplatelet agents. She does however take NSAIDs Past medical history; L4-L5 lumbar fusion, left radial fracture, depression, vertigo  and falls 6/18; right medial thigh just above the knee. Almost surprisingly the hematoma has held together. Has a large necrotic surface over the top but that is still intact. The area is much softer and there is clearly been resorption of the hematoma. There is no evidence that this is infected. 6/25; the area opened about 3 days ago. Patient comes to the clinic with a mild fever. She denies dysuria or cough. The wound is draining necrotic material. 7/2; the patient comes with the open wound perhaps slight Lee more swollen superiorly and inferiorly. Mild tenderness. Culture I did last week showed multiple organisms none predominated. She has not been on antibiotics. 7/10; any degree of infection the patient has seems to have resolved. There is no surrounding tenderness. Still a substantial open wound however there is a right reasonably good looking tissue. She has of the bridged area between the 2 areas. We have been using silver sorb gel wet-to-dry 7/17 the wound looks very healthy. 2 cm of bridging inferiorly. There was confusion about the wound VAC when it arrived at her home nevertheless she was able to get this and we put this on today. 7/24; wound looks remarkably healthy 1 cm of undermining inferiorly which is about half the size of last week. She is tolerating the wound VAC well 7/31; wound looks remarkably healthy. Initially a hematoma. She has been using the wound VAC. The inferior tunneling has resolved. My current thought is that we will use  the wound VAC for another week before changing the silver collagen alone 8/7-Wound looks really good, will not need the wound VAC anymore. There is no tunneling left the wound bed is looking completely granulating and healthy 8/21; wound VAC was discontinued last week the patient is using silver collagen and foam. We are making good improvements of the surface area 9/4; 2-week follow-up. The patient is doing well with silver collagen and bordered foam she is changing the dressing herself. We have nice improvement in surface area 9/18; 2-week follow-up. The patient has 2 small open areas in the shape of the figure 8 with the middle part epithelialized. She is changing the dressing herself using silver collagen and foam. 10/2; 2-week follow-up. The patient has 1 remaining area which is not fully closed although it is very close. She has been using silver collagen. We will change this to alginate today and a border foam however back next week to celebrate the closure of this Objective Constitutional Vitals Time Taken: 11:38 AM, Height: 61 in, Weight: 158 lbs, BMI: 29.9, Temperature: 98.4 F, Pulse: 80 bpm, Respiratory Rate: 18 breaths/min, Blood Pressure: 169/92 mmHg. Integumentary (Hair, Skin) Wound #1 status is Open. Original cause of wound was Trauma. The wound is located on the Right,Medial Upper Leg. The wound measures 0.3cm length x 0.2cm width x 0.1cm depth; 0.047cm^2 area and 0.005cm^3 volume. There is Fat Layer (Subcutaneous Tissue) Exposed exposed. There is no tunneling or undermining noted. There is a none present amount of drainage noted. The wound margin is flat and intact. There is large (67-100%) red, pink granulation within the wound bed. There is no necrotic tissue within the wound bed. 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 Dressing Change Frequency: Wound #1 Right,Medial Upper Leg: Change dressing three times week. Wound Cleansing: May shower and wash wound with soap and water. Primary Wound Dressing: Wound #1 Right,Medial  Upper Leg: Calcium Alginate with Silver Secondary Dressing: Wound #1 Right,Medial Upper Leg: Foam Border - or large bandaid Edema Control: Avoid standing for long periods of time Elevate legs to the level of the heart or above for 30 minutes daily and/or when sitting, a frequency of: - elevate leg with pillow behind the right knee while resting in chair and bed. 1. Silver alginate bordered foam back next week hopefully closed by then Electronic Signature(s) Signed: 08/18/2019 6:19:43 PM By: Linton Ham MD Entered By: Linton Ham on 08/18/2019 12:31:52 -------------------------------------------------------------------------------- SuperBill Details Patient Name: Date of Service: Alyssa Valentine 08/18/2019 Medical Record Number:[id number] Patient Account Number: [id number] Date of Birth/Sex: Treating RN: January 05, 1951 (68 y.o. 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

## 2019-08-24 NOTE — Progress Notes (Signed)
Alyssa Valentine ([id number]) Visit Report for 08/18/2019 Arrival Information Details Patient Name: Date of Service: Alyssa Valentine, Alyssa Valentine 08/18/2019 11:00 AM Medical Record Number:[id number] Patient Account Number: [id number] Date of Birth/Sex: Treating RN: 03/06/1951 (68 y.o. Alyssa Valentine Primary Care Alyssa Valentine: Alyssa Valentine Other Clinician: Referring Alyssa Valentine: Treating Alyssa Valentine/Extender:Alyssa Valentine, Alyssa Valentine, Alyssa Valentine in Treatment: 16 Visit Information History Since Last Visit Cane Added or deleted any medications: No Patient Arrived: 11:37 Any new allergies or adverse reactions: No Arrival Time: Had a fall or experienced change in No Accompanied By: self None activities of daily living that may affect Transfer Assistance: risk of falls: Patient Requires Transmission-Based No Signs or symptoms of abuse/neglect since last No Precautions: visito Patient Has Alerts: No Hospitalized since last visit: No Implantable device outside of the clinic excluding No cellular tissue based products placed in the center since last visit: Has Dressing in Place as Prescribed: Yes Pain Present Now: No Electronic Signature(s) Signed: 08/24/2019 4:29:59 PM By: Kela Millin Entered By: Kela Millin on 08/18/2019 11:37:40 -------------------------------------------------------------------------------- Clinic Level of Care Assessment Details Patient Name: Date of Service: Alyssa Valentine 08/18/2019 11:00 AM Medical Record Number:[id number] Patient Account Number: [id number] Date of Birth/Sex: Treating RN: Sep 16, 1951 (68 y.o. Alyssa Valentine Primary Care Noha Karasik: Alyssa Valentine Other Clinician: Referring Paiton Fosco: Treating Ingram Onnen/Extender:Alyssa Valentine, Alyssa Valentine, Alyssa Valentine in Treatment: 16 Clinic Level of Care Assessment Items TOOL 4 Quantity Score []  - Use when only an EandM is performed on FOLLOW-UP visit 0 ASSESSMENTS - Nursing Assessment /  Reassessment X - Reassessment of Co-morbidities (includes updates in patient status) 1 10 X - Reassessment of Adherence to Treatment Plan 1 5 ASSESSMENTS - Wound and Skin Assessment / Reassessment X - Simple Wound Assessment / Reassessment - one wound 1 5 []  - Complex Wound Assessment / Reassessment - multiple wounds 0 []  - Dermatologic / Skin Assessment (not related to wound area) 0 ASSESSMENTS - Focused Assessment []  - Circumferential Edema Measurements - multi extremities 0 []  - Nutritional Assessment / Counseling / Intervention 0 []  - Lower Extremity Assessment (monofilament, tuning fork, pulses) 0 []  - Peripheral Arterial Disease Assessment (using hand held doppler) 0 ASSESSMENTS - Ostomy and/or Continence Assessment and Care []  - Incontinence Assessment and Management 0 []  - Ostomy Care Assessment and Management (repouching, etc.) 0 PROCESS - Coordination of Care X - Simple Patient / Family Education for ongoing care 1 15 []  - Complex (extensive) Patient / Family Education for ongoing care 0 X - Staff obtains Programmer, systems, Records, Test Results / Process Orders 1 10 []  - Staff telephones HHA, Nursing Homes / Clarify orders / etc 0 []  - Routine Transfer to another Facility (non-emergent condition) 0 []  - Routine Hospital Admission (non-emergent condition) 0 []  - New Admissions / Biomedical engineer / Ordering NPWT, Apligraf, etc. 0 []  - Emergency Hospital Admission (emergent condition) 0 X - Simple Discharge Coordination 1 10 []  - Complex (extensive) Discharge Coordination 0 PROCESS - Special Needs []  - Pediatric / Minor Patient Management 0 []  - Isolation Patient Management 0 []  - Hearing / Language / Visual special needs 0 []  - Assessment of Community assistance (transportation, D/C planning, etc.) 0 []  - Additional assistance / Altered mentation 0 []  - Support Surface(s) Assessment (bed, cushion, seat, etc.) 0 INTERVENTIONS - Wound Cleansing / Measurement X - Simple Wound  Cleansing - one wound 1 5 []  - Complex Wound Cleansing - multiple wounds 0 X - Wound Imaging (photographs - any number of wounds) 1 5 []  - Wound Tracing (  instead of photographs) 0 X - Simple Wound Measurement - one wound 1 5 []  - Complex Wound Measurement - multiple wounds 0 INTERVENTIONS - Wound Dressings X - Small Wound Dressing one or multiple wounds 1 10 []  - Medium Wound Dressing one or multiple wounds 0 []  - Large Wound Dressing one or multiple wounds 0 X - Application of Medications - topical 1 5 []  - Application of Medications - injection 0 INTERVENTIONS - Miscellaneous []  - External ear exam 0 []  - Specimen Collection (cultures, biopsies, blood, body fluids, etc.) 0 []  - Specimen(s) / Culture(s) sent or taken to Lab for analysis 0 []  - Patient Transfer (multiple staff / Civil Service fast streamer / Similar devices) 0 []  - Simple Staple / Suture removal (25 or less) 0 []  - Complex Staple / Suture removal (26 or more) 0 []  - Hypo / Hyperglycemic Management (close monitor of Blood Glucose) 0 []  - Ankle / Brachial Index (ABI) - do not check if billed separately 0 X - Vital Signs 1 5 Has the patient been seen at the hospital within the last three years: Yes Total Score: 90 Level Of Care: New/Established - Level 3 Electronic Signature(s) Signed: 08/18/2019 7:05:19 PM By: Baruch Gouty RN, BSN Entered By: Baruch Gouty on 08/18/2019 11:56:26 -------------------------------------------------------------------------------- Encounter Discharge Information Details Patient Name: Date of Service: Alyssa Valentine 08/18/2019 11:00 AM Medical Record Number:[id number] Patient Account Number: [id number] Date of Birth/Sex: Treating RN: December 21, 1950 (68 y.o. Alyssa Valentine Primary Care October Peery: Alyssa Valentine Other Clinician: Referring Madgie Dhaliwal: Treating Ishia Tenorio/Extender:Alyssa Valentine, Alyssa Valentine, Alyssa Valentine in Treatment: 16 Encounter Discharge Information Items Discharge Condition:  Stable Ambulatory Status: Cane Discharge Destination: Home Transportation: Private Auto Accompanied By: self Schedule Follow-up Appointment: Yes Clinical Summary of Care: Electronic Signature(s) Signed: 08/18/2019 7:05:19 PM By: Baruch Gouty RN, BSN Entered By: Baruch Gouty on 08/18/2019 11:59:15 -------------------------------------------------------------------------------- Lower Extremity Assessment Details Patient Name: Date of Service: Alyssa Valentine, Alyssa Valentine 08/18/2019 11:00 AM Medical Record Number:[id number] Patient Account Number: [id number] Date of Birth/Sex: Treating RN: 1951-11-02 (68 y.o. Alyssa Valentine Primary Care Kevante Lunt: Alyssa Valentine Other Clinician: Referring Perkins Molina: Treating Jaedah Lords/Extender:Alyssa Valentine, Alyssa Valentine, Alyssa Valentine in Treatment: 16 Electronic Signature(s) Signed: 08/24/2019 4:29:59 PM By: Kela Millin Entered By: Kela Millin on 08/18/2019 11:41:10 -------------------------------------------------------------------------------- Multi Wound Chart Details Patient Name: Date of Service: Alyssa Valentine 08/18/2019 11:00 AM Medical Record Number:[id number] Patient Account Number: [id number] Date of Birth/Sex: Treating RN: [birth year] (68 y.o. Martyn Malay, Linda Primary Care Arsenia Goracke: Alyssa Valentine Other Clinician: Referring Vladimir Lenhoff: Treating Theordore Cisnero/Extender:Alyssa Valentine, Alyssa Valentine, Alyssa Valentine in Treatment: 16 Vital Signs Height(in): 61 Pulse(bpm): 80 Weight(lbs): 158 Blood Pressure(mmHg): 169/92 Body Mass Index(BMI): 30 Temperature(F): 98.4 Respiratory 18 Rate(breaths/min): Photos: [1:No Photos] [N/A:N/A] Wound Location: [1:Right Upper Leg - Medial N/A] Wounding Event: [1:Trauma] [N/A:N/A] Primary Etiology: [1:Abrasion] [N/A:N/A] Comorbid History: [1:Cataracts, Glaucoma, Hypertension, Gout, Osteoarthritis] [N/A:N/A] Date Acquired: [1:03/30/2019] [N/A:N/A] Weeks of Treatment: [1:16] [N/A:N/A] Wound  Status: [1:Open] [N/A:N/A] Clustered Wound: [1:Yes] [N/A:N/A] Clustered Quantity: [1:2] [N/A:N/A] Measurements L x W x D 0.3x0.2x0.1 [N/A:N/A] (cm) Area (cm) : [1:0.047] [N/A:N/A] Volume (cm) : [1:0.005] [N/A:N/A] % Reduction in Area: [1:99.90%] [N/A:N/A] % Reduction in Volume: 99.80% [N/A:N/A] Classification: [1:Full Thickness With Exposed Support Structures] [N/A:N/A] Exudate Amount: [1:None Present] [N/A:N/A] Wound Margin: [1:Flat and Intact] [N/A:N/A] Granulation Amount: [1:Large (67-100%)] [N/A:N/A] Granulation Quality: [1:Red, Pink] [N/A:N/A] Necrotic Amount: [1:None Present (0%)] [N/A:N/A] Exposed Structures: [1:Fat Layer (Subcutaneous N/A Tissue) Exposed: Yes Fascia: No Tendon: No Muscle: No Joint: No Bone: No Large (67-100%)] [N/A:N/A] Treatment Notes Wound #1 (Right, Medial  Upper Leg) 1. Cleanse With Wound Cleanser 2. Periwound Care Skin Prep 3. Primary Dressing Applied Calcium Alginate Ag 4. Secondary Dressing Foam Border Dressing 5. Secured With Office manager) Signed: 08/18/2019 6:19:43 PM By: Linton Ham MD Signed: 08/18/2019 7:05:19 PM By: Baruch Gouty RN, BSN Entered By: Linton Ham on 08/18/2019 12:26:18 -------------------------------------------------------------------------------- [place] Details Patient Name: Date of Service: Alyssa Valentine, Alyssa Valentine 08/18/2019 11:00 AM Medical Record Number:[id number] Patient Account Number: [id number] Date of Birth/Sex: Treating RN: July 31, 1951 (68 y.o. Alyssa Valentine Primary Care Bell Cai: Alyssa Valentine Other Clinician: Referring Ilijah Doucet: Treating Emanuela Runnion/Extender:Alyssa Valentine, Alyssa Valentine, Alyssa Valentine in Treatment: 16 Active Inactive Wound/Skin Impairment Nursing Diagnoses: Knowledge deficit related to ulceration/compromised skin integrity Goals: Patient/caregiver will verbalize understanding of skin care regimen Date Initiated:  04/27/2019 Target Resolution Date: 09/15/2019 Goal Status: Active Ulcer/skin breakdown will have a volume reduction of 50% by week 8 Date Initiated: 05/26/2019 Date Inactivated: 06/23/2019 Target Resolution Date: 06/23/2019 Goal Status: Met Ulcer/skin breakdown will have a volume reduction of 80% by week 12 Date Initiated: 06/23/2019 Date Inactivated: 07/21/2019 Target Resolution Date: 07/21/2019 Goal Status: Met Interventions: Assess patient/caregiver ability to obtain necessary supplies Assess patient/caregiver ability to perform ulcer/skin care regimen upon admission and as needed Provide education on ulcer and skin care Treatment Activities: Skin care regimen initiated : 04/27/2019 Topical wound management initiated : 04/27/2019 Notes: Electronic Signature(s) Signed: 08/18/2019 7:05:19 PM By: Baruch Gouty RN, BSN Entered By: Baruch Gouty on 08/18/2019 11:55:41 -------------------------------------------------------------------------------- Pain Assessment Details Patient Name: Date of Service: Alyssa Valentine 08/18/2019 11:00 AM Medical Record Number:[id number] Patient Account Number: [id number] Date of Birth/Sex: Treating RN: 1951/10/14 (68 y.o. Alyssa Valentine Primary Care Evani Shrider: Alyssa Valentine Other Clinician: Referring Lakethia Coppess: Treating Laterrian Hevener/Extender:Alyssa Valentine, Alyssa Valentine, Alyssa Valentine in Treatment: 16 Active Problems Location of Pain Severity and Description of Pain Patient Has Paino No Site Locations Pain Management and Medication Current Pain Management: Electronic Signature(s) Signed: 08/24/2019 4:29:59 PM By: Kela Millin Entered By: Kela Millin on 08/18/2019 11:41:03 -------------------------------------------------------------------------------- Patient/Caregiver Education Details Patient Name: Date of Service: Alyssa Valentine 10/2/2020andnbsp11:00 AM Medical Record Number:[id number] Patient Account Number: [id number] Date of  Birth/Gender: Treating RN: 27-Dec-1950 (68 y.o. Alyssa Valentine Primary Care Physician: Alyssa Valentine Other Clinician: Referring Physician: Treating Physician/Extender:Alyssa Valentine, Alyssa Valentine, Alyssa Valentine in Treatment: 16 Education Assessment Education Provided To: Patient Education Topics Provided Wound/Skin Impairment: Methods: Explain/Verbal Responses: Reinforcements needed, State content correctly [employer]) Signed: 08/18/2019 7:05:19 PM By: Baruch Gouty RN, BSN Entered By: Baruch Gouty on 08/18/2019 11:55:56 -------------------------------------------------------------------------------- Wound Assessment Details Patient Name: Date of Service: Alyssa Valentine 08/18/2019 11:00 AM Medical Record Number:[id number] Patient Account Number: [id number] Date of Birth/Sex: Treating RN: 05-02-1951 (68 y.o. Alyssa Valentine Primary Care Loura Pitt: Alyssa Valentine Other Clinician: Referring Cherre Kothari: Treating Jeptha Hinnenkamp/Extender:Alyssa Valentine, Alyssa Valentine, Alyssa Valentine in Treatment: 16 Wound Status Wound Number: 1 Primary Abrasion Etiology: Wound Location: Right Upper Leg - Medial Wound Open Wounding Event: Trauma Status: Date Acquired: 03/30/2019 Comorbid Cataracts, Glaucoma, Hypertension, Gout, Weeks Of Treatment: 16 History: Osteoarthritis Clustered Wound: Yes Photos Wound Measurements Length: (cm) 0.3 % Reductio Width: (cm) 0.2 % Reductio Depth: (cm) 0.1 Epithelial Clustered Quantity: 2 Tunneling: Area: (cm) 0.047 Undermini Volume: (cm) 0.005 Wound Description Full Thickness With Exposed Support Foul Odor Classification: Structures Slough/Fi Wound Wound Flat and Intact Margin: Exudate None Present Amount: Wound Bed Granulation Amount: Large (67-100%) Granulation Quality: Red, Pink Fascia Necrotic Amount: None Present (0%) Fat La Tendon Muscle Joint Bone E After Cleansing: No brino No Exposed Structure  Exposed:  No yer (Subcutaneous Tissue) Exposed: Yes Exposed: No Exposed: No Exposed: No xposed: No n in Area: 99.9% n in Volume: 99.8% ization: Large (67-100%) No ng: No Treatment Notes Wound #1 (Right, Medial Upper Leg) 1. Cleanse With Wound Cleanser 2. Periwound Care Skin Prep 3. Primary Dressing Applied Calcium Alginate Ag 4. Secondary Dressing Foam Border Dressing 5. Secured With Office manager) Signed: 08/21/2019 4:14:10 PM By: Mikeal Hawthorne EMT/HBOT Signed: 08/24/2019 4:29:59 PM By: Kela Millin Entered By: Mikeal Hawthorne on 08/21/2019 10:35:09 -------------------------------------------------------------------------------- Vitals Details Patient Name: Date of Service: Alyssa Valentine 08/18/2019 11:00 AM Medical Record Number:[id number] Patient Account Number: [id number] Date of Birth/Sex: Treating RN: [birth year] (68 y.o. Alyssa Valentine Primary Care Piccola Arico: Alyssa Valentine Other Clinician: Referring Chauncey Sciulli: Treating Orlander Norwood/Extender:Alyssa Valentine, Alyssa Valentine, Alyssa Valentine in Treatment: 16 Vital Signs Time Taken: 11:38 Temperature (F): 98.4 Height (in): 61 Pulse (bpm): 80 Weight (lbs): 158 Respiratory Rate (breaths/min): 18 Body Mass Index (BMI): 29.9 Blood Pressure (mmHg): 169/92 Reference Range: 80 - 120 mg / dl Electronic Signature(s) Signed: 08/24/2019 4:29:59 PM By: Kela Millin Entered By: Kela Millin on 08/18/2019 11:40:56

## 2019-08-25 ENCOUNTER — Encounter (HOSPITAL_BASED_OUTPATIENT_CLINIC_OR_DEPARTMENT_OTHER): Payer: Medicare Other | Attending: Internal Medicine | Admitting: Internal Medicine

## 2019-08-25 ENCOUNTER — Other Ambulatory Visit: Payer: Self-pay

## 2019-08-25 DIAGNOSIS — L98492 Non-pressure chronic ulcer of skin of other sites with fat layer exposed: Secondary | ICD-10-CM | POA: Insufficient documentation

## 2019-08-25 DIAGNOSIS — I10 Essential (primary) hypertension: Secondary | ICD-10-CM | POA: Diagnosis not present

## 2019-08-29 NOTE — Progress Notes (Signed)
Alyssa, Valentine ([id number]) Visit Report for 08/25/2019 HPI Details Patient Name: Date of Service: Alyssa Valentine, Alyssa Valentine 08/25/2019 12:30 PM Medical Record Number:[id number] Patient Account Number: [id number] Date of Birth/Sex: Treating RN: November 23, 1950 (68 y.o. Alyssa Valentine Primary Care Provider: Merrilee Seashore Other Clinician: Referring Provider: Treating Provider/Extender:Freman Lapage, Reggy Eye, Leonidas Romberg in Treatment: 17 History of Present Illness HPI Description: ADMISSION 04/27/2019 This is a 68 year old woman who tells me she had a fall in her sister's home on May 14. She struck the inside of her leg on a tree stump. She developed a large amount of swelling. She saw her primary physician on 04/21/2019 he noted it was a wound and some surrounding erythema. No real comment about the swelling around it that I can see. It was noted that there was purulent drainage she was given doxycycline and an appointment was made for her to come here. She finished the doxycycline recently she has been applying Neosporin. She is not on anticoagulants or antiplatelet agents. She does however take NSAIDs Past medical history; L4-L5 lumbar fusion, left radial fracture, depression, vertigo and falls 6/18; right medial thigh just above the knee. Almost surprisingly the hematoma has held together. Has a large necrotic surface over the top but that is still intact. The area is much softer and there is clearly been resorption of the hematoma. There is no evidence that this is infected. 6/25; the area opened about 3 days ago. Patient comes to the clinic with a mild fever. She denies dysuria or cough. The wound is draining necrotic material. 7/2; the patient comes with the open wound perhaps slight Lee more swollen superiorly and inferiorly. Mild tenderness. Culture I did last week showed multiple organisms none predominated. She has not been on antibiotics. 7/10; any degree of infection the  patient has seems to have resolved. There is no surrounding tenderness. Still a substantial open wound however there is a right reasonably good looking tissue. She has of the bridged area between the 2 areas. We have been using silver sorb gel wet-to-dry 7/17 the wound looks very healthy. 2 cm of bridging inferiorly. There was confusion about the wound VAC when it arrived at her home nevertheless she was able to get this and we put this on today. 7/24; wound looks remarkably healthy 1 cm of undermining inferiorly which is about half the size of last week. She is tolerating the wound VAC well 7/31; wound looks remarkably healthy. Initially a hematoma. She has been using the wound VAC. The inferior tunneling has resolved. My current thought is that we will use the wound VAC for another week before changing the silver collagen alone 8/7-Wound looks really good, will not need the wound VAC anymore. There is no tunneling left the wound bed is looking completely granulating and healthy 8/21; wound VAC was discontinued last week the patient is using silver collagen and foam. We are making good improvements of the surface area 9/4; 2-week follow-up. The patient is doing well with silver collagen and bordered foam she is changing the dressing herself. We have nice improvement in surface area 9/18; 2-week follow-up. The patient has 2 small open areas in the shape of the figure 8 with the middle part epithelialized. She is changing the dressing herself using silver collagen and foam. 10/2; 2-week follow-up. The patient has 1 remaining area which is not fully closed although it is very close. She has been using silver collagen. We will change this to alginate today and a border foam however  back next week to celebrate the closure of this 10/9; the patient is fully healed today. This was initially a traumatic hematoma. This required a wound VAC for a period of time and it is finally closed. Electronic  Signature(s) Signed: 08/27/2019 10:27:00 AM By: Linton Ham MD Entered By: Linton Ham on 08/25/2019 15:17:28 -------------------------------------------------------------------------------- Physical Exam Details Patient Name: Date of Service: Alyssa Valentine 08/25/2019 12:30 PM Medical Record Number:[id number] Patient Account Number: [id number] Date of Birth/Sex: Treating RN: 1951-02-03 (68 y.o. Alyssa Valentine Primary Care Provider: Merrilee Seashore Other Clinician: Referring Provider: Treating Provider/Extender:Shelle Galdamez, Reggy Eye, Leonidas Romberg in Treatment: 20 Constitutional Patient is hypertensive.. Pulse regular and within target range for patient.Marland Kitchen Respirations regular, non-labored and within target range.. Temperature is normal and within the target range for the patient.Marland Kitchen Appears in no distress. Notes Wound exam; right medial thigh. Nothing is open here. Tissue looks healthy Electronic Signature(s) Signed: 08/27/2019 10:27:00 AM By: Linton Ham MD Entered By: Linton Ham on 08/25/2019 15:18:44 -------------------------------------------------------------------------------- Physician Orders Details Patient Name: Date of Service: Alyssa Valentine 08/25/2019 12:30 PM Medical Record Number:[id number] Patient Account Number: [id number] Date of Birth/Sex: Treating RN: [birth year] (68 y.o. 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 Alyssa Valentine Primary Care Provider: Merrilee Seashore Other Clinician: Referring Provider: Treating Provider/Extender:Macen Joslin, Reggy Eye, Leonidas Romberg in Treatment: 17 Subjective History of Present Illness (HPI) ADMISSION 04/27/2019 This is a 68 year old woman who tells me she had a fall in her sister's home on May 14. She struck the inside of her leg on a tree stump. She developed a large amount of swelling. She saw her primary physician on 04/21/2019 he noted it was a wound and some surrounding erythema. No real comment about the swelling around it that I can see. It was noted that there was purulent drainage she was  given doxycycline and an appointment was made for her to come here. She finished the doxycycline recently she has been applying Neosporin. She is not on anticoagulants or antiplatelet agents. She does however take NSAIDs Past medical history; L4-L5 lumbar fusion, left radial fracture, depression, vertigo and falls 6/18; right medial thigh just above the knee. Almost surprisingly the hematoma has held together. Has a large necrotic surface over the top  but that is still intact. The area is much softer and there is clearly been resorption of the hematoma. There is no evidence that this is infected. 6/25; the area opened about 3 days ago. Patient comes to the clinic with a mild fever. She denies dysuria or cough. The wound is draining necrotic material. 7/2; the patient comes with the open wound perhaps slight Lee more swollen superiorly and inferiorly. Mild tenderness. Culture I did last week showed multiple organisms none predominated. She has not been on antibiotics. 7/10; any degree of infection the patient has seems to have resolved. There is no surrounding tenderness. Still a substantial open wound however there is a right reasonably good looking tissue. She has of the bridged area between the 2 areas. We have been using silver sorb gel wet-to-dry 7/17 the wound looks very healthy. 2 cm of bridging inferiorly. There was confusion about the wound VAC when it arrived at her home nevertheless she was able to get this and we put this on today. 7/24; wound looks remarkably healthy 1 cm of undermining inferiorly which is about half the size of last week. She is tolerating the wound VAC well 7/31; wound looks remarkably healthy. Initially a hematoma. She has been using the wound VAC. The inferior tunneling has resolved. My current thought is that we will use the wound VAC for another week before changing the silver collagen alone 8/7-Wound looks really good, will not need the wound VAC anymore.  There is no tunneling left the wound bed is looking completely granulating and healthy 8/21; wound VAC was discontinued last week the patient is using silver collagen and foam. We are making good improvements of the surface area 9/4; 2-week follow-up. The patient is doing well with silver collagen and bordered foam she is changing the dressing herself. We have nice improvement in surface area 9/18; 2-week follow-up. The patient has 2 small open areas in the shape of the figure 8 with the middle part epithelialized. She is changing the dressing herself using silver collagen and foam. 10/2; 2-week follow-up. The patient has 1 remaining area which is not fully closed although it is very close. She has been using silver collagen. We will change this to alginate today and a border foam however back next week to celebrate the closure of this 10/9; the patient is fully healed today. This was initially a traumatic hematoma. This required a wound VAC for a period of time and it is finally closed. Objective Constitutional Patient is hypertensive.. Pulse regular and within target range for patient.Marland Kitchen Respirations regular, non-labored and within target range.. Temperature is normal and within the target range for the patient.Marland Kitchen Appears in no distress. Vitals Time Taken: 1:40 PM, Height: 61 in, Weight: 158 lbs, BMI: 29.9, Temperature: 98.2 F, Pulse: 90 bpm, Respiratory Rate: 18 breaths/min, Blood Pressure: 144/83 mmHg. General Notes: Wound exam; right medial thigh. Nothing is open here. Tissue looks healthy Integumentary (Hair, Skin) Wound #1 status is Open. Original cause of wound was Trauma. The wound is located on the Right,Medial Upper Leg. The wound measures 0cm length x 0cm width x 0cm depth; 0cm^2 area and 0cm^3 volume. There is no tunneling or undermining noted. There is a none present amount of drainage noted. The wound margin is flat and intact. There is no granulation within the wound bed.  There is no necrotic tissue within the wound bed. 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 The patient to be discharged from the wound care center. 2. This was initially a traumatic wound causing a large hematoma. This is finally closed over. 3. No secondary prevention Electronic Signature(s) Signed: 08/27/2019 10:27:00 AM By: Linton Ham MD Entered By: Linton Ham on 08/25/2019 15:21:32 -------------------------------------------------------------------------------- SuperBill Details Patient Name: Date of Service: Alyssa Valentine 08/25/2019 Medical Record Number:[id number] Patient Account Number: [id number] Date of Birth/Sex: Treating RN: [birth year] (68 y.o. 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

## 2019-09-27 ENCOUNTER — Other Ambulatory Visit: Payer: Self-pay

## 2019-09-27 ENCOUNTER — Ambulatory Visit (INDEPENDENT_AMBULATORY_CARE_PROVIDER_SITE_OTHER): Payer: Federal, State, Local not specified - PPO | Admitting: Otolaryngology

## 2019-09-27 ENCOUNTER — Encounter (INDEPENDENT_AMBULATORY_CARE_PROVIDER_SITE_OTHER): Payer: Self-pay | Admitting: Otolaryngology

## 2019-09-27 VITALS — Temp 97.9°F

## 2019-09-27 DIAGNOSIS — H6123 Impacted cerumen, bilateral: Secondary | ICD-10-CM | POA: Diagnosis not present

## 2019-09-27 NOTE — Progress Notes (Signed)
HPI: Alyssa Valentine is a 68 y.o. female who returns today to get her ears cleaned. Patient states she has noticed her hearing has decreased over the last couple weeks. She denies ear pain or ear drainage.  Past Medical History:  Diagnosis Date  . Anemia    many years ago  . Anxiety   . Arthritis   . Depression   . GERD (gastroesophageal reflux disease)   . Glaucoma   . Headache    hx of migraines, no longer have them  . HTN (hypertension)    Past Surgical History:  Procedure Laterality Date  . CARPAL TUNNEL RELEASE Right 1990's  . CARPAL TUNNEL RELEASE Left 01/04/2016   Procedure: CARPAL TUNNEL RELEASE;  Surgeon: Roseanne Kaufman, MD;  Location: [place];  Service: Orthopedics;  Laterality: Left;  . CESAREAN SECTION  1985  . COLONOSCOPY N/A 09/15/2013   Procedure: COLONOSCOPY;  Surgeon: Beryle Beams, MD;  Location: [facility];  Service: Endoscopy;  Laterality: N/A;  . ESOPHAGOGASTRODUODENOSCOPY N/A 09/15/2013   Procedure: ESOPHAGOGASTRODUODENOSCOPY (EGD);  Surgeon: Beryle Beams, MD;  Location: [facility];  Service: Endoscopy;  Laterality: N/A;  . ORIF RADIAL FRACTURE Left 01/04/2016   Procedure: OPEN REDUCTION INTERNAL FIXATION (ORIF) LEFT RADIAL FRACTURE;  Surgeon: Roseanne Kaufman, MD;  Location: [place];  Service: Orthopedics;  Laterality: Left;  . ROTATOR CUFF REPAIR Bilateral 2010 and 2013   Social History   Socioeconomic History  . Marital status: Married    Spouse name: Not on file  . Number of children: Not on file  . Years of education: Not on file  . Highest education level: Not on file  Occupational History  . Not on file  Social Needs  . Financial resource strain: Not on file  . Food insecurity    Worry: Not on file    Inability: Not on file  . Transportation needs    Medical: Not on file    Non-medical: Not on file  Tobacco Use  . Smoking status: Never Smoker  . Smokeless tobacco: Never Used  Substance and Sexual Activity  . Alcohol use: Yes    Comment:  occasional  . Drug use: No  . Sexual activity: Not on file  Lifestyle  . Physical activity    Days per week: Not on file    Minutes per session: Not on file  . Stress: Not on file  Relationships  . Social Herbalist on phone: Not on file    Gets together: Not on file    Attends religious service: Not on file    Active member of club or organization: Not on file    Attends meetings of clubs or organizations: Not on file    Relationship status: Not on file  Other Topics Concern  . Not on file  Social History Narrative  . Not on file   No family history on file. Allergies  Allergen Reactions  . Sulfa Antibiotics Itching and Swelling   Prior to Admission medications   Medication Sig Start Date End Date Taking? Authorizing Provider  atorvastatin (LIPITOR) 40 MG tablet Take 40 mg by mouth every evening.    [provider]  benazepril-hydrochlorthiazide (LOTENSIN HCT) 10-12.5 MG per tablet Take 1 tablet by mouth every morning.    [provider]  Calcium Carbonate-Vit D-Min (CALCIUM 1200 PO) Take 2 tablets by mouth daily.    [provider]  cyclobenzaprine (FLEXERIL) 10 MG tablet Take 1 tablet (10 mg total) by  mouth 3 (three) times daily as needed for muscle spasms. 08/28/16   Newman Pies, MD  docusate sodium (COLACE) 100 MG capsule Take 1 capsule (100 mg total) by mouth 2 (two) times daily. 08/28/16   Newman Pies, MD  KLOR-CON M20 20 MEQ tablet Take 20 mEq by mouth daily. 07/24/16   [provider]  latanoprost (XALATAN) 0.005 % ophthalmic solution INSTILL 1 DROP IN BOTH EYES NIGHTLY 12/09/15   [provider]  Multiple Vitamin (MULTIVITAMIN WITH MINERALS) TABS tablet Take 1 tablet by mouth daily.    [provider]  omeprazole (PRILOSEC) 20 MG capsule Take 20 mg by mouth daily as needed. For acid reflux. 05/08/16   [provider]  oxyCODONE-acetaminophen (PERCOCET/ROXICET) 5-325 MG tablet Take 1-2 tablets  by mouth every 4 (four) hours as needed for moderate pain. 08/28/16   Newman Pies, MD  sertraline (ZOLOFT) 100 MG tablet Take 200 mg by mouth daily. 12/14/15   [provider]  zolpidem (AMBIEN) 5 MG tablet Take 5 mg by mouth at bedtime as needed for sleep.    [provider]     Positive ROS: positive for decreased hearing; negative for ear pain or ear drainage  All other systems have been reviewed and were otherwise negative with the exception of those mentioned in the HPI and as above.  Physical Exam: General: Alert, no acute distress Ears: Ear canals are full of wax bilaterally; this was removed in office revealing clear EACs bilaterally with clear, intact TMs. Neck: No palpable adenopathy or masses  Cerumen impaction removal  Date/Time: 09/27/2019 10:17 AM Performed by: Lucilla Edin, PA-C Authorized by: Rozetta Nunnery, MD   Consent:    Consent obtained:  Verbal   Consent given by:  Patient   Risks discussed:  Pain Procedure details:    Location:  L ear and R ear   Procedure type: curette and forceps   Post-procedure details:    Inspection:  Canal normal   Hearing quality:  Improved   Patient tolerance of procedure:  Tolerated well, no immediate complications    Assessment: Cerumen impaction  Plan: Ears cleaned in office with subjective improvement in symptoms.  Alyssa Ollis, PA-C  I agree with the assessment and plan as outlined above. Radene Journey, MD

## 2019-10-24 NOTE — Progress Notes (Signed)
DORISMAR, BASSE ([id number]) Visit Report for 08/25/2019 Arrival Information Details Patient Name: Date of Service: TRESS, MOLTER 08/25/2019 12:30 PM Medical Record Number:[id number] Patient Account Number: [id number] Date of Birth/Sex: Treating RN: 07/16/1951 (68 y.o. Orvan Falconer Primary Care Azelyn Batie: Merrilee Seashore Other Clinician: Referring Osiris Odriscoll: Treating Diaz Crago/Extender:Robson, Reggy Eye, Leonidas Romberg in Treatment: 17 Visit Information History Since Last Visit Cane Added or deleted any medications: No Patient Arrived: 13:39 Any new allergies or adverse reactions: No Arrival Time: Had a fall or experienced change in No Accompanied By: self None activities of daily living that may affect Transfer Assistance: risk of falls: Patient Identification Verified: Yes Signs or symptoms of abuse/neglect since last No Secondary Verification Process Completed: Yes visito Patient Requires Transmission-Based No Hospitalized since last visit: No Precautions: Implantable device outside of the clinic excluding No Patient Has Alerts: No cellular tissue based products placed in the center since last visit: Has Dressing in Place as Prescribed: Yes Pain Present Now: No Electronic Signature(s) Signed: 10/24/2019 2:59:38 PM By: Carlene Coria RN Entered By: Carlene Coria on 08/25/2019 13:40:15 -------------------------------------------------------------------------------- Clinic Level of Care Assessment Details Patient Name: Date of Service: ERANDY, PHILBERT 08/25/2019 12:30 PM Medical Record Number:[id number] Patient Account Number: [id number] Date of Birth/Sex: Treating RN: Sep 11, 1951 (68 y.o. Nancy Fetter Primary Care Kenley Rettinger: Merrilee Seashore Other Clinician: Referring Jaicob Dia: Treating Jhett Fretwell/Extender:Robson, Reggy Eye, Leonidas Romberg in Treatment: 17 Clinic Level of Care Assessment Items TOOL 4 Quantity Score X - Use when only an EandM  is performed on FOLLOW-UP visit 1 0 ASSESSMENTS - Nursing Assessment / Reassessment X - Reassessment of Co-morbidities (includes updates in patient status) 1 10 X - Reassessment of Adherence to Treatment Plan 1 5 ASSESSMENTS - Wound and Skin Assessment / Reassessment X - Simple Wound Assessment / Reassessment - one wound 1 5 []  - Complex Wound Assessment / Reassessment - multiple wounds 0 []  - Dermatologic / Skin Assessment (not related to wound area) 0 ASSESSMENTS - Focused Assessment []  - Circumferential Edema Measurements - multi extremities 0 []  - Nutritional Assessment / Counseling / Intervention 0 []  - Lower Extremity Assessment (monofilament, tuning fork, pulses) 0 []  - Peripheral Arterial Disease Assessment (using hand held doppler) 0 ASSESSMENTS - Ostomy and/or Continence Assessment and Care []  - Incontinence Assessment and Management 0 []  - Ostomy Care Assessment and Management (repouching, etc.) 0 PROCESS - Coordination of Care X - Simple Patient / Family Education for ongoing care 1 15 []  - Complex (extensive) Patient / Family Education for ongoing care 0 X - Staff obtains Programmer, systems, Records, Test Results / Process Orders 1 10 []  - Staff telephones HHA, Nursing Homes / Clarify orders / etc 0 []  - Routine Transfer to another Facility (non-emergent condition) 0 []  - Routine Hospital Admission (non-emergent condition) 0 []  - New Admissions / Biomedical engineer / Ordering NPWT, Apligraf, etc. 0 []  - Emergency Hospital Admission (emergent condition) 0 X - Simple Discharge Coordination 1 10 []  - Complex (extensive) Discharge Coordination 0 PROCESS - Special Needs []  - Pediatric / Minor Patient Management 0 []  - Isolation Patient Management 0 []  - Hearing / Language / Visual special needs 0 []  - Assessment of Community assistance (transportation, D/C planning, etc.) 0 []  - Additional assistance / Altered mentation 0 []  - Support Surface(s) Assessment (bed, cushion, seat,  etc.) 0 INTERVENTIONS - Wound Cleansing / Measurement X - Simple Wound Cleansing - one wound 1 5 []  - Complex Wound Cleansing - multiple wounds 0 X - Wound Imaging (photographs -  any number of wounds) 1 5 []  - Wound Tracing (instead of photographs) 0 X - Simple Wound Measurement - one wound 1 5 []  - Complex Wound Measurement - multiple wounds 0 INTERVENTIONS - Wound Dressings []  - Small Wound Dressing one or multiple wounds 0 []  - Medium Wound Dressing one or multiple wounds 0 []  - Large Wound Dressing one or multiple wounds 0 []  - Application of Medications - topical 0 []  - Application of Medications - injection 0 INTERVENTIONS - Miscellaneous []  - External ear exam 0 []  - Specimen Collection (cultures, biopsies, blood, body fluids, etc.) 0 []  - Specimen(s) / Culture(s) sent or taken to Lab for analysis 0 []  - Patient Transfer (multiple staff / Civil Service fast streamer / Similar devices) 0 []  - Simple Staple / Suture removal (25 or less) 0 []  - Complex Staple / Suture removal (26 or more) 0 []  - Hypo / Hyperglycemic Management (close monitor of Blood Glucose) 0 []  - Ankle / Brachial Index (ABI) - do not check if billed separately 0 X - Vital Signs 1 5 Has the patient been seen at the hospital within the last three years: Yes Total Score: 75 Level Of Care: New/Established - Level 2 Electronic Signature(s) Signed: 08/29/2019 5:52:09 PM By: Levan Hurst RN, BSN Entered By: Levan Hurst on 08/25/2019 16:20:13 -------------------------------------------------------------------------------- Encounter Discharge Information Details Patient Name: Date of Service: [name] 08/25/2019 12:30 PM Medical Record [id number] Patient Account Number: [id number] Date of Birth/Sex: Treating RN: 1951-09-11 (68 y.o. Nancy Fetter Primary Care Jvon Meroney: Merrilee Seashore Other Clinician: Referring Savan Ruta: Treating Rydan Gulyas/Extender:Robson, Reggy Eye, Leonidas Romberg in Treatment:  17 Encounter Discharge Information Items Discharge Condition: Stable Ambulatory Status: Cane Discharge Destination: Home Transportation: Private Auto Accompanied By: alone Schedule Follow-up Appointment: Yes Clinical Summary of Care: Patient Declined Electronic Signature(s) Signed: 08/29/2019 5:52:09 PM By: Levan Hurst RN, BSN Entered By: Levan Hurst on 08/25/2019 16:20:46 -------------------------------------------------------------------------------- Multi Wound Chart Details Patient Name: Date of Service: [name] 08/25/2019 12:30 PM Medical Record [id number] Patient Account Number: [id number] Date of Birth/Sex: Treating RN: [birth year] (68 y.o. Nancy Fetter Primary Care Emmylou Bieker: Merrilee Seashore Other Clinician: Referring Reuven Braver: Treating Genova Kiner/Extender:Robson, Reggy Eye, Leonidas Romberg in Treatment: 17 Vital Signs Height(in): 61 Pulse(bpm): 90 Weight(lbs): 158 Blood Pressure(mmHg): 144/83 Body Mass Index(BMI): 30 Temperature(F): 98.2 Respiratory 18 Rate(breaths/min): Photos: [1:No Photos] [N/A:N/A] Wound Location: [1:Right Upper Leg - Medial] [N/A:N/A] Wounding Event: [1:Trauma] [N/A:N/A] Primary Etiology: [1:Abrasion] [N/A:N/A] Comorbid History: [1:Cataracts, Glaucoma, Hypertension, Gout, Osteoarthritis] [N/A:N/A] Date Acquired: [1:03/30/2019] [N/A:N/A] Weeks of Treatment: [1:17] [N/A:N/A] Wound Status: [1:Open] [N/A:N/A] Clustered Wound: [1:Yes] [N/A:N/A] Clustered Quantity: [1:2] [N/A:N/A] Measurements L x W x D [1:0x0x0] [N/A:N/A] (cm) Area (cm) : [1:0] [N/A:N/A] Volume (cm) : [1:0] [N/A:N/A] % Reduction in Area: [1:100.00%] [N/A:N/A] % Reduction in Volume: [1:100.00%] [N/A:N/A] Classification: [1:Full Thickness With Exposed Support Structures] [N/A:N/A] Exudate Amount: [1:None Present] [N/A:N/A] Wound Margin: [1:Flat and Intact] [N/A:N/A] Granulation Amount: [1:None Present (0%)] [N/A:N/A] Necrotic Amount:  [1:None Present (0%)] [N/A:N/A] Exposed Structures: [1:Fascia: No Fat Layer (Subcutaneous Tissue) Exposed: No Tendon: No Muscle: No Joint: No Bone: No Large (67-100%)] [N/A:N/A N/A] Treatment Notes Electronic Signature(s) Signed: 08/27/2019 10:27:00 AM By: Linton Ham MD Signed: 08/29/2019 5:52:09 PM By: Levan Hurst RN, BSN Entered By: Linton Ham on 08/25/2019 14:52:55 -------------------------------------------------------------------------------- Multi-Disciplinary Care Plan Details Patient Name: Date of Service: [name] 08/25/2019 12:30 PM Medical Record Number:[id number] Patient Account Number: [id number] Date of Birth/Sex: Treating RN: Sep 18, 1951 (68 y.o. Nancy Fetter Primary Care Nadira Single: Merrilee Seashore Other  Clinician: Referring Jolon Degante: Treating Rashad Obeid/Extender:Robson, Reggy Eye, Leonidas Romberg in Treatment: 17 Active Inactive Electronic Signature(s) Signed: 08/29/2019 5:52:09 PM By: Levan Hurst RN, BSN Entered By: Levan Hurst on 08/25/2019 14:10:42 -------------------------------------------------------------------------------- Pain Assessment Details Patient Name: Date of Service: [name] 08/25/2019 12:30 PM Medical Record Number:[id number] Patient Account Number: [id number] Date of Birth/Sex: Treating RN: [birth year] (68 y.o. Orvan Falconer Primary Care Shine Mikes: Merrilee Seashore Other Clinician: Referring Geovanna Simko: Treating Danella Philson/Extender:Robson, Reggy Eye, Leonidas Romberg in Treatment: 17 Active Problems Location of Pain Severity and Description of Pain Patient Has Paino No Site Locations Pain Management and Medication Current Pain Management: Electronic Signature(s) Signed: 10/24/2019 2:59:38 PM By: Carlene Coria RN Entered By: Carlene Coria on 08/25/2019 13:42:11 -------------------------------------------------------------------------------- Patient/Caregiver Education Details Patient  Name: Date of Service: [name] 10/9/2020andnbsp12:30 PM Medical Record Number:[id number] Patient Account Number: [id number] Date of Birth/Gender: Treating RN: 29-Jan-1951 (68 y.o. Nancy Fetter Primary Care Physician: Merrilee Seashore Other Clinician: Referring Physician: Treating Physician/Extender:Robson, Reggy Eye, Leonidas Romberg in Treatment: 17 Education Assessment Education Provided To: Patient Education Topics Provided Wound/Skin Impairment: Methods: Explain/Verbal Responses: State content correctly [employer]) Signed: 08/29/2019 5:52:09 PM By: Levan Hurst RN, BSN Entered By: Levan Hurst on 08/25/2019 13:12:16 -------------------------------------------------------------------------------- Wound Assessment Details Patient Name: Date of Service: [name] 08/25/2019 12:30 PM Medical Record Number:[id number] Patient Account Number: [id number] Date of Birth/Sex: Treating RN: Jan 30, 1951 (68 y.o. Orvan Falconer Primary Care Shishir Krantz: Merrilee Seashore Other Clinician: Referring Laythan Hayter: Treating Rubylee Zamarripa/Extender:Robson, Reggy Eye, Leonidas Romberg in Treatment: 17 Wound Status Wound Number: 1 Primary Abrasion Etiology: Wound Location: Right Upper Leg - Medial Wound Healed - Epithelialized Wounding Event: Trauma Status: Date Acquired: 03/30/2019 Comorbid Cataracts, Glaucoma, Hypertension, Gout, Weeks Of Treatment: 17 History: Osteoarthritis Clustered Wound: Yes Photos Wound Measurements Length: (cm) 0 % Reducti Width: (cm) 0 % Reducti Depth: (cm) 0 Epithelia Clustered Quantity: 2 Tunneling Area: (cm) 0 Undermin Volume: (cm) 0 Wound Description Classification: Full Thickness With Exposed Support Foul Odor Structures Slough/Fi Wound Flat and Intact Margin: Exudate Exudate None Present Amount: Wound Bed Granulation Amount: None Present (0%) Necrotic Amount: None Present (0%) Fascia Exp Fat  Layer Tendon Exp Muscle Exp Joint Expo Bone Expos After Cleansing: No brino No Exposed Structure osed: No (Subcutaneous Tissue) Exposed: No osed: No osed: No sed: No ed: No on in Area: 100% on in Volume: 100% lization: Large (67-100%) : No ing: No Electronic Signature(s) Signed: 08/28/2019 3:43:33 PM By: Mikeal Hawthorne EMT/HBOT Signed: 10/24/2019 2:59:38 PM By: Carlene Coria RN Entered By: Mikeal Hawthorne on 08/28/2019 09:09:37 -------------------------------------------------------------------------------- Vitals Details Patient Name: Date of Service: [name] 08/25/2019 12:30 PM Medical Record Number:[id number] Patient Account Number: [id number] Date of Birth/Sex: Treating RN: 17-Feb-1951 (68 y.o. Orvan Falconer Primary Care Lacretia Tindall: Merrilee Seashore Other Clinician: Referring Miklo Aken: Treating Inga Noller/Extender:Robson, Reggy Eye, Leonidas Romberg in Treatment: 17 Vital Signs Time Taken: 13:40 Temperature (F): 98.2 Height (in): 61 Pulse (bpm): 90 Weight (lbs): 158 Respiratory Rate (breaths/min): 18 Body Mass Index (BMI): 29.9 Blood Pressure (mmHg): 144/83 Reference Range: 80 - 120 mg / dl Electronic Signature(s) Signed: 10/24/2019 2:59:38 PM By: Carlene Coria RN Entered By: Carlene Coria on 08/25/2019 13:42:03

## 2019-11-22 ENCOUNTER — Other Ambulatory Visit: Payer: Self-pay

## 2019-11-22 DIAGNOSIS — Z20822 Contact with and (suspected) exposure to covid-19: Secondary | ICD-10-CM

## 2019-11-24 LAB — SPECIMEN STATUS REPORT

## 2019-11-24 LAB — NOVEL CORONAVIRUS, NAA: SARS-CoV-2, NAA: NOT DETECTED

## 2020-04-23 ENCOUNTER — Other Ambulatory Visit: Payer: Self-pay

## 2020-04-23 ENCOUNTER — Ambulatory Visit (INDEPENDENT_AMBULATORY_CARE_PROVIDER_SITE_OTHER): Payer: Federal, State, Local not specified - PPO | Admitting: Otolaryngology

## 2020-04-23 ENCOUNTER — Encounter (INDEPENDENT_AMBULATORY_CARE_PROVIDER_SITE_OTHER): Payer: Self-pay | Admitting: Otolaryngology

## 2020-04-23 VITALS — Temp 97.3°F

## 2020-04-23 DIAGNOSIS — H6123 Impacted cerumen, bilateral: Secondary | ICD-10-CM | POA: Diagnosis not present

## 2020-04-23 NOTE — Progress Notes (Signed)
HPI: Alyssa Valentine is a 69 y.o. female who presents for evaluation of wax buildup in her ears.  She also 1 to see about getting a hearing test..  Past Medical History:  Diagnosis Date   Anemia    many years ago   Anxiety    Arthritis    Depression    GERD (gastroesophageal reflux disease)    Glaucoma    Headache    hx of migraines, no longer have them   HTN (hypertension)    Past Surgical History:  Procedure Laterality Date   CARPAL TUNNEL RELEASE Right 1990's   CARPAL TUNNEL RELEASE Left 01/04/2016   Procedure: CARPAL TUNNEL RELEASE;  Surgeon: Roseanne Kaufman, MD;  Location: [facility];  Service: Orthopedics;  Laterality: Left;   [place]   COLONOSCOPY N/A 09/15/2013   Procedure: COLONOSCOPY;  Surgeon: Beryle Beams, MD;  Location: [facility];  Service: Endoscopy;  Laterality: N/A;   ESOPHAGOGASTRODUODENOSCOPY N/A 09/15/2013   Procedure: ESOPHAGOGASTRODUODENOSCOPY (EGD);  Surgeon: Beryle Beams, MD;  Location: [facility];  Service: Endoscopy;  Laterality: N/A;   ORIF RADIAL FRACTURE Left 01/04/2016   Procedure: OPEN REDUCTION INTERNAL FIXATION (ORIF) LEFT RADIAL FRACTURE;  Surgeon: Roseanne Kaufman, MD;  Location: [facility];  Service: Orthopedics;  Laterality: Left;   ROTATOR CUFF REPAIR Bilateral 2010 and 2013   Social History   Socioeconomic History   Marital status: Married    Spouse name: Not on file   Number of children: Not on file   Years of education: Not on file   Highest education level: Not on file  Occupational History   Not on file  Tobacco Use   Smoking status: Never Smoker   Smokeless tobacco: Never Used  Substance and Sexual Activity   Alcohol use: Yes    Comment: occasional   Drug use: No   Sexual activity: Not on file  Other Topics Concern   Not on file  Social History Narrative   Not on file   Social Determinants of Health   Financial Resource Strain:    Difficulty of Paying Living Expenses:   Food  Insecurity:    Worried About Charity fundraiser in the Last Year:    Arboriculturist in the Last Year:   Transportation Needs:    Film/video editor (Medical):    Lack of Transportation (Non-Medical):   Physical Activity:    Days of Exercise per Week:    Minutes of Exercise per Session:   Stress:    Feeling of Stress :   Social Connections:    Frequency of Communication with Friends and Family:    Frequency of Social Gatherings with Friends and Family:    Attends Religious Services:    Active Member of Clubs or Organizations:    Attends Archivist Meetings:    Marital Status:    No family history on file. Allergies  Allergen Reactions   Sulfa Antibiotics Itching and Swelling   Prior to Admission medications   Medication Sig Start Date End Date Taking? Authorizing Provider  atorvastatin (LIPITOR) 40 MG tablet Take 40 mg by mouth every evening.   Yes [provider]  benazepril-hydrochlorthiazide (LOTENSIN HCT) 10-12.5 MG per tablet Take 1 tablet by mouth every morning.   Yes [provider]  Calcium Carbonate-Vit D-Min (CALCIUM 1200 PO) Take 2 tablets by mouth daily.   Yes [provider]  cyclobenzaprine (FLEXERIL) 10 MG tablet Take 1 tablet (10 mg total) by  mouth 3 (three) times daily as needed for muscle spasms. 08/28/16  Yes Eleen Litz Pies, MD  docusate sodium (COLACE) 100 MG capsule Take 1 capsule (100 mg total) by mouth 2 (two) times daily. 08/28/16  Yes Rozelle Caudle Pies, MD  KLOR-CON M20 20 MEQ tablet Take 20 mEq by mouth daily. 07/24/16  Yes [provider]  latanoprost (XALATAN) 0.005 % ophthalmic solution INSTILL 1 DROP IN BOTH EYES NIGHTLY 12/09/15  Yes [provider]  Multiple Vitamin (MULTIVITAMIN WITH MINERALS) TABS tablet Take 1 tablet by mouth daily.   Yes [provider]  omeprazole (PRILOSEC) 20 MG capsule Take 20 mg by mouth daily as needed. For acid reflux. 05/08/16  Yes [provider]  oxyCODONE-acetaminophen (PERCOCET/ROXICET) 5-325 MG tablet Take 1-2 tablets by mouth every 4 (four) hours as needed for moderate pain. 08/28/16  Yes Carrington Olazabal Pies, MD  sertraline (ZOLOFT) 100 MG tablet Take 200 mg by mouth daily. 12/14/15  Yes [provider]  zolpidem (AMBIEN) 5 MG tablet Take 5 mg by mouth at bedtime as needed for sleep.   Yes [provider]     Positive ROS: Otherwise negative  All other systems have been reviewed and were otherwise negative with the exception of those mentioned in the HPI and as above.  Physical Exam: Constitutional: Alert, well-appearing, no acute distress Ears: External ears without lesions or tenderness. Ear canals she has small ear canals bilaterally with wax obstructing both ear canals that was cleaned with curettes.  TMs were clear bilaterally.. Nasal: External nose without lesions. Clear nasal passages Oral: Oropharynx clear. Neck: No palpable adenopathy or masses Respiratory: Breathing comfortably  Skin: No facial/neck lesions or rash noted.  Cerumen impaction removal  Date/Time: 04/23/2020 2:22 PM Performed by: Rozetta Nunnery, MD Authorized by: Rozetta Nunnery, MD   Consent:    Consent obtained:  Verbal   Consent given by:  Patient   Risks discussed:  Pain and bleeding Procedure details:    Location:  L ear and R ear   Procedure type: curette   Post-procedure details:    Inspection:  TM intact and canal normal   Hearing quality:  Improved   Patient tolerance of procedure:  Tolerated well, no immediate complications Comments:     TMs are clear bilaterally.    Assessment: Bilateral cerumen impactions with small ear canals.  Plan: Recommended obtaining hearing test on her own and she will schedule this  Radene Journey, MD

## 2020-06-25 ENCOUNTER — Other Ambulatory Visit (HOSPITAL_COMMUNITY): Payer: Self-pay | Admitting: Student

## 2020-06-25 DIAGNOSIS — M5416 Radiculopathy, lumbar region: Secondary | ICD-10-CM

## 2020-07-25 ENCOUNTER — Ambulatory Visit (HOSPITAL_COMMUNITY): Admission: RE | Admit: 2020-07-25 | Payer: Federal, State, Local not specified - PPO | Source: Ambulatory Visit

## 2020-07-25 ENCOUNTER — Encounter (HOSPITAL_COMMUNITY): Payer: Self-pay

## 2020-08-28 ENCOUNTER — Other Ambulatory Visit: Payer: Self-pay

## 2020-08-28 ENCOUNTER — Encounter: Payer: Self-pay | Admitting: Cardiology

## 2020-08-28 ENCOUNTER — Ambulatory Visit (INDEPENDENT_AMBULATORY_CARE_PROVIDER_SITE_OTHER): Payer: Federal, State, Local not specified - PPO | Admitting: Cardiology

## 2020-08-28 VITALS — BP 120/76 | Ht 61.0 in | Wt 152.0 lb

## 2020-08-28 DIAGNOSIS — I10 Essential (primary) hypertension: Secondary | ICD-10-CM | POA: Diagnosis not present

## 2020-08-28 DIAGNOSIS — Z8249 Family history of ischemic heart disease and other diseases of the circulatory system: Secondary | ICD-10-CM | POA: Diagnosis not present

## 2020-08-28 DIAGNOSIS — E7800 Pure hypercholesterolemia, unspecified: Secondary | ICD-10-CM | POA: Diagnosis not present

## 2020-08-28 DIAGNOSIS — Z7189 Other specified counseling: Secondary | ICD-10-CM | POA: Diagnosis not present

## 2020-08-28 NOTE — Progress Notes (Signed)
Cardiology Office Note:    Date:  08/28/2020   ID:  Alyssa Valentine, DOB [birth year], MRN [id number]  PCP:  Merrilee Seashore, MD  Cardiologist:  Buford Dresser, MD  Referring MD: Merrilee Seashore, MD   CC: new patient consultation for evaluation of cardiovascular risk  History of Present Illness:    Alyssa Valentine is a 69 y.o. female with a hx of hypertension, hyperlipidemia who is seen as a new consult at the request of Merrilee Seashore, MD for the evaluation and management of cardiovascular risk.  Today: Patient asked Dr. Ashby Dawes that she be evaluated for cardiovascular risk. Brother has had two heart attacks, earliest in his 34s. Has had a procedure for blockages on his heart. She has a history of hypertension and hyperlipidemia.  Cardiovascular risk factors: Prior clinical ASCVD:  none Comorbid conditions: hypertension, hyperlipidemia. Denies diabetes, chronic kidney disease Metabolic syndrome/Obesity: Chronic inflammatory conditions: none Tobacco use history: never Family history: Brother with two Mis, earliest in his 27s. No other MI or stroke that she knows up Prior cardiac testing and/or incidental findings on other testing (ie coronary calcium): thinks she had a stress test many years ago Exercise level: does house/yardwork, does get tired after a time Current diet: "anything and everything." Rare fried foods. Tries to eat a lot of fruits/vegetables.   Has been on blood pressure medicine "for a long time." Takes atorvastatin, has done this for at least a few years. Checks blood pressure at home, has been steady.   Denies chest pain. No PND, orthopnea, LE edema or unexpected weight gain. No syncope or palpitations. Has longstanding shortness of breath, unchanged. Has vertigo, has intermittent falls because of this. Has chronic sinus drainage and cough.  Past Medical History:  Diagnosis Date  . Anemia    many years ago  . Anxiety   . Arthritis   .  Depression   . GERD (gastroesophageal reflux disease)   . Glaucoma   . Headache    hx of migraines, no longer have them  . HTN (hypertension)     Past Surgical History:  Procedure Laterality Date  . CARPAL TUNNEL RELEASE Right 1990's  . CARPAL TUNNEL RELEASE Left 01/04/2016   Procedure: CARPAL TUNNEL RELEASE;  Surgeon: Roseanne Kaufman, MD;  Location: [place];  Service: Orthopedics;  Laterality: Left;  . CESAREAN SECTION  1985  . COLONOSCOPY N/A 09/15/2013   Procedure: COLONOSCOPY;  Surgeon: Beryle Beams, MD;  Location: [facility];  Service: Endoscopy;  Laterality: N/A;  . ESOPHAGOGASTRODUODENOSCOPY N/A 09/15/2013   Procedure: ESOPHAGOGASTRODUODENOSCOPY (EGD);  Surgeon: Beryle Beams, MD;  Location: [facility];  Service: Endoscopy;  Laterality: N/A;  . ORIF RADIAL FRACTURE Left 01/04/2016   Procedure: OPEN REDUCTION INTERNAL FIXATION (ORIF) LEFT RADIAL FRACTURE;  Surgeon: Roseanne Kaufman, MD;  Location: [place];  Service: Orthopedics;  Laterality: Left;  . ROTATOR CUFF REPAIR Bilateral 2010 and 2013    Current Medications: Current Outpatient Medications on File Prior to Visit  Medication Sig  . atorvastatin (LIPITOR) 40 MG tablet Take 40 mg by mouth every evening.  . Calcium Carbonate-Vit D-Min (CALCIUM 1200 PO) Take 2 tablets by mouth daily.  . hydrochlorothiazide (HYDRODIURIL) 25 MG tablet hydrochlorothiazide 25 mg tablet  . KLOR-CON M20 20 MEQ tablet Take 20 mEq by mouth daily.  Marland Kitchen latanoprost (XALATAN) 0.005 % ophthalmic solution INSTILL 1 DROP IN BOTH EYES NIGHTLY  . Multiple Vitamin (MULTIVITAMIN WITH MINERALS) TABS tablet Take 1 tablet by mouth daily.  Marland Kitchen omeprazole (PRILOSEC) 20  MG capsule Take 20 mg by mouth daily as needed. For acid reflux.  . sertraline (ZOLOFT) 100 MG tablet Take 200 mg by mouth daily.  . valsartan (DIOVAN) 320 MG tablet valsartan 320 mg tablet  TAKE 1 TABLET BY MOUTH EVERY DAY   No current facility-administered medications on file prior to visit.      Allergies:   Sulfa antibiotics   Social History   Tobacco Use  . Smoking status: Never Smoker  . Smokeless tobacco: Never Used  Substance Use Topics  . Alcohol use: Yes    Comment: occasional  . Drug use: No    Family History: Brother with two Mis, earliest in his 70s. No other MI or stroke that she knows up  ROS:   Please see the history of present illness.  Additional pertinent ROS: Constitutional: Negative for chills, fever, night sweats, unintentional weight loss  HENT: Negative for ear pain and hearing loss.   Eyes: Negative for loss of vision and eye pain.  Respiratory: Negative for cough, sputum, wheezing.   Cardiovascular: See HPI. Gastrointestinal: Negative for abdominal pain, melena, and hematochezia.  Genitourinary: Negative for dysuria and hematuria.  Musculoskeletal: Negative for falls and myalgias.  Skin: Negative for itching and rash.  Neurological: Negative for focal weakness, focal sensory changes and loss of consciousness.  Endo/Heme/Allergies: Does not bruise/bleed easily.     EKGs/Labs/Other Studies Reviewed:    The following studies were reviewed today: No prior cardiac studies  EKG:  EKG is personally reviewed.  The ekg ordered today demonstrates NSR at 90 bpm with nonspecific ST pattern  Recent Labs: No results found for requested labs within last 8760 hours.  Recent Lipid Panel No results found for: CHOL, TRIG, HDL, CHOLHDL, VLDL, LDLCALC, LDLDIRECT  Physical Exam:    VS:  BP 120/76   Ht 5\' 1"  (1.549 m)   Wt 152 lb (68.9 kg)   SpO2 96%   BMI 28.72 kg/m     Wt Readings from Last 3 Encounters:  08/28/20 152 lb (68.9 kg)  08/26/16 149 lb (67.6 kg)  08/18/16 149 lb 6.4 oz (67.8 kg)    GEN: Well nourished, well developed in no acute distress HEENT: Normal, moist mucous membranes NECK: No JVD CARDIAC: regular rhythm, normal S1 and S2, no rubs or gallops. No murmurs. VASCULAR: Radial and DP pulses 2+ bilaterally. No carotid  bruits RESPIRATORY:  Clear to auscultation without rales, wheezing or rhonchi  ABDOMEN: Soft, non-tender, non-distended MUSCULOSKELETAL:  Ambulates independently SKIN: Warm and dry, no edema NEUROLOGIC:  Alert and oriented x 3. No focal neuro deficits noted. PSYCHIATRIC:  Normal affect    ASSESSMENT:    1. Essential hypertension   2. Pure hypercholesterolemia   3. Family history of heart disease   4. Cardiac risk counseling   5. Counseling on health promotion and disease prevention    PLAN:    Hypertension: at goal today -continue benazepril-HCTZ  Hypercholesterolemia: partial labs available through [facility].  -lipids 03/18/20 show HDL 53, LDL 106, TG 129 -continue atorvastatin, tolerating 40 mg dose  Family history of heart disease Cardiac risk counseling and prevention recommendations: We discussed CV risk and prevention guidelines at length today. She is asymptomatic. She is already on atorvastatin, and we discussed that this is the recommended prevention strategy for people with elevated risk. -ECG without acute changes today -discussed red flag warning signs that need immediate medical attention -recommend heart healthy/Mediterranean diet, with whole grains, fruits, vegetable, fish, lean meats, nuts, and olive oil.  Limit salt. -recommend moderate walking, 3-5 times/week for 30-50 minutes each session. Aim for at least 150 minutes.week. Goal should be pace of 3 miles/hours, or walking 1.5 miles in 30 minutes -recommend avoidance of tobacco products. Avoid excess alcohol. -Additional risk factor control:  -Diabetes risk: A1c is not available, denies history  -Weight: BMI 28 -ASCVD risk score: The ASCVD Risk score Mikey Bussing DC Jr., et al., 2013) failed to calculate for the following reasons:   Cannot find a previous HDL lab   Cannot find a previous total cholesterol lab    Plan for follow up: I would be happy to see her back as needed  Buford Dresser, MD, PhD [place]   [facility]    Medication Adjustments/Labs and Tests Ordered: Current medicines are reviewed at length with the patient today.  Concerns regarding medicines are outlined above.  Orders Placed This Encounter  Procedures  . EKG 12-Lead   No orders of the defined types were placed in this encounter.   Patient Instructions  Medication Instructions:  Your Physician recommend you continue on your current medication as directed.    *If you need a refill on your cardiac medications before your next appointment, please call your pharmacy*   Lab Work: None ordered.   Testing/Procedures: None ordered    Follow-Up: At [facility], you and your health needs are our priority.  As part of our continuing mission to provide you with exceptional heart care, we have created designated Provider Care Teams.  These Care Teams include your primary Cardiologist (physician) and Advanced Practice Providers (APPs -  Physician Assistants and Nurse Practitioners) who all work together to provide you with the care you need, when you need it.  We recommend signing up for the patient portal called "MyChart".  Sign up information is provided on this After Visit Summary.  MyChart is used to connect with patients for Virtual Visits (Telemedicine).  Patients are able to view lab/test results, encounter notes, upcoming appointments, etc.  Non-urgent messages can be sent to your provider as well.   To learn more about what you can do with MyChart, go to [email or web address].    Your next appointment:   As needed  The format for your next appointment:   In Person  Provider:   Buford Dresser, MD       Signed, Buford Dresser, MD PhD 08/28/2020    [place]

## 2020-08-28 NOTE — Patient Instructions (Signed)
Medication Instructions:  Your Physician recommend you continue on your current medication as directed.    *If you need a refill on your cardiac medications before your next appointment, please call your pharmacy*   Lab Work: None ordered   Testing/Procedures: None ordered    Follow-Up: At [facility], you and your health needs are our priority.  As part of our continuing mission to provide you with exceptional heart care, we have created designated Provider Care Teams.  These Care Teams include your primary Cardiologist (physician) and Advanced Practice Providers (APPs -  Physician Assistants and Nurse Practitioners) who all work together to provide you with the care you need, when you need it.  We recommend signing up for the patient portal called "MyChart".  Sign up information is provided on this After Visit Summary.  MyChart is used to connect with patients for Virtual Visits (Telemedicine).  Patients are able to view lab/test results, encounter notes, upcoming appointments, etc.  Non-urgent messages can be sent to your provider as well.   To learn more about what you can do with MyChart, go to [email or web address].    Your next appointment:   As needed  The format for your next appointment:   In Person  Provider:   Bridgette Christopher, MD    

## 2020-09-13 ENCOUNTER — Other Ambulatory Visit: Payer: Self-pay

## 2020-09-13 ENCOUNTER — Encounter (INDEPENDENT_AMBULATORY_CARE_PROVIDER_SITE_OTHER): Payer: Self-pay | Admitting: Otolaryngology

## 2020-09-13 ENCOUNTER — Ambulatory Visit (INDEPENDENT_AMBULATORY_CARE_PROVIDER_SITE_OTHER): Payer: Federal, State, Local not specified - PPO | Admitting: Otolaryngology

## 2020-09-13 VITALS — Temp 97.2°F

## 2020-09-13 DIAGNOSIS — H6123 Impacted cerumen, bilateral: Secondary | ICD-10-CM

## 2020-09-13 NOTE — Progress Notes (Signed)
HPI: Alyssa Valentine is a 69 y.o. female who presents for evaluation of wax buildup in her ears.  She was last cleaned on 04/23/2020.  She has noticed blockage of her hearing over the last week or 2..  Past Medical History:  Diagnosis Date  . Anemia    many years ago  . Anxiety   . Arthritis   . Depression   . GERD (gastroesophageal reflux disease)   . Glaucoma   . Headache    hx of migraines, no longer have them  . HTN (hypertension)    Past Surgical History:  Procedure Laterality Date  . CARPAL TUNNEL RELEASE Right 1990's  . CARPAL TUNNEL RELEASE Left 01/04/2016   Procedure: CARPAL TUNNEL RELEASE;  Surgeon: Roseanne Kaufman, MD;  Location: [place];  Service: Orthopedics;  Laterality: Left;  . CESAREAN SECTION  1985  . COLONOSCOPY N/A 09/15/2013   Procedure: COLONOSCOPY;  Surgeon: Beryle Beams, MD;  Location: [facility];  Service: Endoscopy;  Laterality: N/A;  . ESOPHAGOGASTRODUODENOSCOPY N/A 09/15/2013   Procedure: ESOPHAGOGASTRODUODENOSCOPY (EGD);  Surgeon: Beryle Beams, MD;  Location: [facility];  Service: Endoscopy;  Laterality: N/A;  . ORIF RADIAL FRACTURE Left 01/04/2016   Procedure: OPEN REDUCTION INTERNAL FIXATION (ORIF) LEFT RADIAL FRACTURE;  Surgeon: Roseanne Kaufman, MD;  Location: [place];  Service: Orthopedics;  Laterality: Left;  . ROTATOR CUFF REPAIR Bilateral 2010 and 2013   Social History   Socioeconomic History  . Marital status: Married    Spouse name: Not on file  . Number of children: Not on file  . Years of education: Not on file  . Highest education level: Not on file  Occupational History  . Not on file  Tobacco Use  . Smoking status: Never Smoker  . Smokeless tobacco: Never Used  Substance and Sexual Activity  . Alcohol use: Yes    Comment: occasional  . Drug use: No  . Sexual activity: Not on file  Other Topics Concern  . Not on file  Social History Narrative  . Not on file   Social Determinants of Health   Financial Resource Strain:   .  Difficulty of Paying Living Expenses: Not on file  Food Insecurity:   . Worried About Charity fundraiser in the Last Year: Not on file  . Ran Out of Food in the Last Year: Not on file  Transportation Needs:   . Lack of Transportation (Medical): Not on file  . Lack of Transportation (Non-Medical): Not on file  Physical Activity:   . Days of Exercise per Week: Not on file  . Minutes of Exercise per Session: Not on file  Stress:   . Feeling of Stress : Not on file  Social Connections:   . Frequency of Communication with Friends and Family: Not on file  . Frequency of Social Gatherings with Friends and Family: Not on file  . Attends Religious Services: Not on file  . Active Member of Clubs or Organizations: Not on file  . Attends Archivist Meetings: Not on file  . Marital Status: Not on file   No family history on file. Allergies  Allergen Reactions  . Sulfa Antibiotics Itching and Swelling   Prior to Admission medications   Medication Sig Start Date End Date Taking? Authorizing Provider  atorvastatin (LIPITOR) 40 MG tablet Take 40 mg by mouth every evening.   Yes [provider]  Calcium Carbonate-Vit D-Min (CALCIUM 1200 PO) Take 2 tablets by mouth daily.  Yes [provider]  hydrochlorothiazide (HYDRODIURIL) 25 MG tablet hydrochlorothiazide 25 mg tablet   Yes [provider]  KLOR-CON M20 20 MEQ tablet Take 20 mEq by mouth daily. 07/24/16  Yes [provider]  latanoprost (XALATAN) 0.005 % ophthalmic solution INSTILL 1 DROP IN BOTH EYES NIGHTLY 12/09/15  Yes [provider]  Multiple Vitamin (MULTIVITAMIN WITH MINERALS) TABS tablet Take 1 tablet by mouth daily.   Yes [provider]  omeprazole (PRILOSEC) 20 MG capsule Take 20 mg by mouth daily as needed. For acid reflux. 05/08/16  Yes [provider]  sertraline (ZOLOFT) 100 MG tablet Take 200 mg by mouth daily. 12/14/15  Yes [provider]  valsartan  (DIOVAN) 320 MG tablet valsartan 320 mg tablet  TAKE 1 TABLET BY MOUTH EVERY DAY   Yes [provider]     Positive ROS: Otherwise negative  All other systems have been reviewed and were otherwise negative with the exception of those mentioned in the HPI and as above.  Physical Exam: Constitutional: Alert, well-appearing, no acute distress Ears: External ears without lesions or tenderness. Ear canals she has small ear canals bilaterally with the wax obstructing both ear canals.  This was cleaned with suction forceps and curettes.  TMs were clear bilaterally.. Nasal: External nose without lesions. Clear nasal passages Oral: Oropharynx clear. Neck: No palpable adenopathy or masses Respiratory: Breathing comfortably  Skin: No facial/neck lesions or rash noted.  Cerumen impaction removal  Date/Time: 09/13/2020 2:00 PM Performed by: Rozetta Nunnery, MD Authorized by: Rozetta Nunnery, MD   Consent:    Consent obtained:  Verbal   Consent given by:  Patient   Risks discussed:  Pain and bleeding Procedure details:    Location:  L ear and R ear   Procedure type: curette, suction and forceps   Post-procedure details:    Inspection:  TM intact and canal normal   Hearing quality:  Improved   Patient tolerance of procedure:  Tolerated well, no immediate complications Comments:     TMs are clear bilaterally.    Assessment: Bilateral cerumen impactions.  Plan: She will follow-up in 4 to 5 months for recheck and cleaning.  Radene Journey, MD

## 2020-10-15 ENCOUNTER — Telehealth: Payer: Self-pay

## 2021-01-16 ENCOUNTER — Ambulatory Visit (HOSPITAL_COMMUNITY)
Admission: RE | Admit: 2021-01-16 | Discharge: 2021-01-16 | Disposition: A | Discharge status: Home / Self Care | Payer: Federal, State, Local not specified - PPO | Source: Ambulatory Visit | Attending: Student | Admitting: Student

## 2021-01-16 ENCOUNTER — Other Ambulatory Visit: Payer: Self-pay

## 2021-01-16 DIAGNOSIS — M5416 Radiculopathy, lumbar region: Secondary | ICD-10-CM | POA: Insufficient documentation

## 2021-01-16 MED ORDER — GADOBUTROL 1 MMOL/ML IV SOLN
6.0000 mL | Freq: Once | INTRAVENOUS | Status: AC | PRN
  Administered 2021-01-16: 6 mL via INTRAVENOUS

## 2021-09-02 ENCOUNTER — Ambulatory Visit (INDEPENDENT_AMBULATORY_CARE_PROVIDER_SITE_OTHER): Payer: Federal, State, Local not specified - PPO | Admitting: Otolaryngology

## 2021-09-02 ENCOUNTER — Other Ambulatory Visit: Payer: Self-pay

## 2021-09-02 DIAGNOSIS — H6123 Impacted cerumen, bilateral: Secondary | ICD-10-CM | POA: Diagnosis not present

## 2021-09-02 NOTE — Progress Notes (Signed)
HPI: Alyssa Valentine is a 70 y.o. female who presents for evaluation of wax buildup in her ears.  She sees me about every 6 months to have her ears cleaned..  Past Medical History:  Diagnosis Date   Anemia    many years ago   Anxiety    Arthritis    Depression    GERD (gastroesophageal reflux disease)    Glaucoma    Headache    hx of migraines, no longer have them   HTN (hypertension)    Past Surgical History:  Procedure Laterality Date   CARPAL TUNNEL RELEASE Right 1990's   CARPAL TUNNEL RELEASE Left 01/04/2016   Procedure: CARPAL TUNNEL RELEASE;  Surgeon: Roseanne Kaufman, MD;  Location: [place];  Service: Orthopedics;  Laterality: Left;   [place]   COLONOSCOPY N/A 09/15/2013   Procedure: COLONOSCOPY;  Surgeon: Beryle Beams, MD;  Location: [facility];  Service: Endoscopy;  Laterality: N/A;   ESOPHAGOGASTRODUODENOSCOPY N/A 09/15/2013   Procedure: ESOPHAGOGASTRODUODENOSCOPY (EGD);  Surgeon: Beryle Beams, MD;  Location: [facility];  Service: Endoscopy;  Laterality: N/A;   ORIF RADIAL FRACTURE Left 01/04/2016   Procedure: OPEN REDUCTION INTERNAL FIXATION (ORIF) LEFT RADIAL FRACTURE;  Surgeon: Roseanne Kaufman, MD;  Location: [place];  Service: Orthopedics;  Laterality: Left;   ROTATOR CUFF REPAIR Bilateral 2010 and 2013   Social History   Socioeconomic History   Marital status: Married    Spouse name: Not on file   Number of children: Not on file   Years of education: Not on file   Highest education level: Not on file  Occupational History   Not on file  Tobacco Use   Smoking status: Never   Smokeless tobacco: Never  Substance and Sexual Activity   Alcohol use: Yes    Comment: occasional   Drug use: No   Sexual activity: Not on file  Other Topics Concern   Not on file  Social History Narrative   Not on file   Social Determinants of Health   Financial Resource Strain: Not on file  Food Insecurity: Not on file  Transportation Needs: Not on file   Physical Activity: Not on file  Stress: Not on file  Social Connections: Not on file   No family history on file. Allergies  Allergen Reactions   Sulfa Antibiotics Itching and Swelling   Prior to Admission medications   Medication Sig Start Date End Date Taking? Authorizing Provider  atorvastatin (LIPITOR) 40 MG tablet Take 40 mg by mouth every evening.    [provider]  Calcium Carbonate-Vit D-Min (CALCIUM 1200 PO) Take 2 tablets by mouth daily.    [provider]  hydrochlorothiazide (HYDRODIURIL) 25 MG tablet hydrochlorothiazide 25 mg tablet    [provider]  KLOR-CON M20 20 MEQ tablet Take 20 mEq by mouth daily. 07/24/16   [provider]  latanoprost (XALATAN) 0.005 % ophthalmic solution INSTILL 1 DROP IN BOTH EYES NIGHTLY 12/09/15   [provider]  Multiple Vitamin (MULTIVITAMIN WITH MINERALS) TABS tablet Take 1 tablet by mouth daily.    [provider]  omeprazole (PRILOSEC) 20 MG capsule Take 20 mg by mouth daily as needed. For acid reflux. 05/08/16   [provider]  sertraline (ZOLOFT) 100 MG tablet Take 200 mg by mouth daily. 12/14/15   [provider]  valsartan (DIOVAN) 320 MG tablet valsartan 320 mg tablet  TAKE 1 TABLET BY MOUTH EVERY DAY    [provider]  Positive ROS: Otherwise negative  All other systems have been reviewed and were otherwise negative with the exception of those mentioned in the HPI and as above.  Physical Exam: Constitutional: Alert, well-appearing, no acute distress Ears: External ears without lesions or tenderness. Ear canals are small with moderate wax buildup in both ear canals occluding the ear canals.  This was cleaned with forceps suction and curette.  TMs were clear bilaterally with improved hearing.. Nasal: External nose without lesions. Clear nasal passages Oral: Oropharynx clear. Neck: No palpable adenopathy or masses Respiratory: Breathing  comfortably  Skin: No facial/neck lesions or rash noted.  Cerumen impaction removal  Date/Time: 09/02/2021 1:16 PM Performed by: Rozetta Nunnery, MD Authorized by: Rozetta Nunnery, MD   Consent:    Consent obtained:  Verbal   Consent given by:  Patient   Risks discussed:  Pain and bleeding Procedure details:    Location:  L ear and R ear   Procedure type: curette, suction and forceps   Post-procedure details:    Inspection:  TM intact and canal normal   Hearing quality:  Improved   Procedure completion:  Tolerated well, no immediate complications Comments:     TMs are clear bilaterally.  Assessment: Bilateral cerumen buildup  Plan: Ear canals were cleaned in the office today with improved hearing. Discussed with her concerning my retirement at the end of the month and gave the names of the other 2 ENT groups to follow-up with if needed.  Radene Journey, MD

## 2021-09-20 ENCOUNTER — Emergency Department (HOSPITAL_BASED_OUTPATIENT_CLINIC_OR_DEPARTMENT_OTHER): Payer: Federal, State, Local not specified - PPO

## 2021-09-20 ENCOUNTER — Emergency Department (HOSPITAL_BASED_OUTPATIENT_CLINIC_OR_DEPARTMENT_OTHER)
Admission: EM | Admit: 2021-09-20 | Discharge: 2021-09-20 | Disposition: A | Discharge status: Home / Self Care | Payer: Federal, State, Local not specified - PPO | Attending: Emergency Medicine | Admitting: Emergency Medicine

## 2021-09-20 ENCOUNTER — Other Ambulatory Visit: Payer: Self-pay

## 2021-09-20 ENCOUNTER — Encounter (HOSPITAL_BASED_OUTPATIENT_CLINIC_OR_DEPARTMENT_OTHER): Payer: Self-pay | Admitting: *Deleted

## 2021-09-20 DIAGNOSIS — I10 Essential (primary) hypertension: Secondary | ICD-10-CM | POA: Diagnosis not present

## 2021-09-20 DIAGNOSIS — M25512 Pain in left shoulder: Secondary | ICD-10-CM | POA: Diagnosis not present

## 2021-09-20 DIAGNOSIS — M25511 Pain in right shoulder: Secondary | ICD-10-CM | POA: Insufficient documentation

## 2021-09-20 DIAGNOSIS — Z79899 Other long term (current) drug therapy: Secondary | ICD-10-CM | POA: Diagnosis not present

## 2021-09-20 MED ORDER — HYDROCODONE-ACETAMINOPHEN 5-325 MG PO TABS
1.0000 | ORAL_TABLET | Freq: Once | ORAL | Status: DC
  Filled 2021-09-20: qty 1

## 2021-09-20 MED ORDER — HYDROCODONE-ACETAMINOPHEN 5-325 MG PO TABS: 1.0000 | ORAL_TABLET | Freq: Four times a day (QID) | ORAL | 0 refills | Status: DC | PRN

## 2021-09-20 NOTE — ED Notes (Signed)
ED Provider at bedside. 

## 2021-09-20 NOTE — ED Notes (Signed)
XR at bedside

## 2021-09-20 NOTE — ED Triage Notes (Signed)
Bilateral shoulder pain radiating to her arms for a month.

## 2021-09-20 NOTE — ED Notes (Signed)
Provider aware of pt not having driver; no new orders received.

## 2021-09-20 NOTE — ED Provider Notes (Signed)
[facility] Provider Note   CSN: [id number] Arrival date & time: 09/20/21  1423     History Chief Complaint  Patient presents with   Shoulder Pain    [facility] is a 70 y.o. female.  Patient followed by Rosanne Gutting.  Patient with a history of arthritis problems.  Patient with 1 month complaint of bilateral shoulder pain without a significant injury.  The right shoulder is somewhat stiff when trying to place the hand on top of her head.  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   Radius distal fracture 01/04/2016   Tinea unguium 02/23/2014   Insomnia 02/23/2014   Other and unspecified hyperlipidemia 02/23/2014   HTN (hypertension)     Past Surgical History:  Procedure Laterality Date   CARPAL TUNNEL RELEASE Right 1990's   CARPAL TUNNEL RELEASE Left 01/04/2016   Procedure: CARPAL TUNNEL RELEASE;  Surgeon: Roseanne Kaufman, MD;  Location: [facility];  Service: Orthopedics;  Laterality: Left;   [facility]   COLONOSCOPY N/A 09/15/2013   Procedure: COLONOSCOPY;  Surgeon: Beryle Beams, MD;  Location: [facility];  Service: Endoscopy;  Laterality: N/A;   ESOPHAGOGASTRODUODENOSCOPY N/A 09/15/2013   Procedure: ESOPHAGOGASTRODUODENOSCOPY (EGD);  Surgeon: Beryle Beams, MD;  Location: [facility];  Service: Endoscopy;  Laterality: N/A;   ORIF RADIAL FRACTURE Left 01/04/2016   Procedure: OPEN REDUCTION INTERNAL FIXATION (ORIF) LEFT RADIAL FRACTURE;  Surgeon: Roseanne Kaufman, MD;  Location: [facility];  Service: Orthopedics;  Laterality: Left;   ROTATOR CUFF REPAIR Bilateral 2010 and 2013      OB History     Gravida  8   Para      Term      Preterm      AB  4   Living         SAB      IAB      Ectopic      Multiple      Live Births              History reviewed. No pertinent family history.  Social History   Tobacco Use   Smoking status: Never   Smokeless tobacco: Never  Vaping Use   Vaping Use: Never used  Substance Use Topics   Alcohol use: Yes    Comment: occasional   Drug use: No    Home Medications Prior to Admission medications   Medication Sig Start Date End Date Taking? Authorizing Provider  atorvastatin (LIPITOR) 40 MG tablet Take 40 mg by mouth every evening.   Yes [provider]  Calcium Carbonate-Vit D-Min (CALCIUM 1200 PO) Take 2 tablets by mouth daily.   Yes [provider]  hydrochlorothiazide (HYDRODIURIL) 25 MG tablet hydrochlorothiazide 25 mg tablet   Yes [provider]  HYDROcodone-acetaminophen (NORCO/VICODIN) 5-325 MG tablet Take 1 tablet by mouth every 6 (six) hours as needed for moderate pain. 09/20/21  Yes Fredia Sorrow, MD  KLOR-CON M20 20 MEQ tablet Take 20 mEq by mouth  daily. 07/24/16  Yes [provider]  latanoprost (XALATAN) 0.005 % ophthalmic solution INSTILL 1 DROP IN BOTH EYES NIGHTLY 12/09/15  Yes [provider]  Multiple Vitamin (MULTIVITAMIN WITH MINERALS) TABS tablet Take 1 tablet by mouth daily.   Yes [provider]  omeprazole (PRILOSEC) 20 MG capsule Take 20 mg by mouth daily as needed. For acid reflux. 05/08/16  Yes [provider]  sertraline (ZOLOFT) 100 MG tablet Take 200 mg by mouth daily. 12/14/15  Yes [provider]  valsartan (DIOVAN) 320 MG tablet valsartan 320 mg tablet  TAKE 1 TABLET BY MOUTH EVERY DAY   Yes [provider]    Allergies    Sulfa antibiotics  Review of Systems   Review of Systems  Constitutional:  Negative for chills and fever.  HENT:  Negative for ear pain and sore throat.   Eyes:   Negative for pain and visual disturbance.  Respiratory:  Negative for cough and shortness of breath.   Cardiovascular:  Negative for chest pain and palpitations.  Gastrointestinal:  Negative for abdominal pain and vomiting.  Genitourinary:  Negative for dysuria and hematuria.  Musculoskeletal:  Positive for arthralgias and joint swelling. Negative for back pain.  Skin:  Negative for color change and rash.  Neurological:  Negative for seizures and syncope.  All other systems reviewed and are negative.  Physical Exam Updated Vital Signs BP 117/75   Pulse 84   Temp 98.8 F (37.1 C)   Resp 18   Ht 1.549 m (5\' 1" )   Wt 63.5 kg   SpO2 97%   BMI 26.45 kg/m   Physical Exam Vitals and nursing note reviewed.  Constitutional:      General: She is not in acute distress.    Appearance: Normal appearance. She is well-developed.  HENT:     Head: Normocephalic and atraumatic.  Eyes:     Extraocular Movements: Extraocular movements intact.     Conjunctiva/sclera: Conjunctivae normal.     Pupils: Pupils are equal, round, and reactive to light.  Cardiovascular:     Rate and Rhythm: Normal rate and regular rhythm.     Heart sounds: No murmur heard. Pulmonary:     Effort: Pulmonary effort is normal. No respiratory distress.     Breath sounds: Normal breath sounds.  Abdominal:     Palpations: Abdomen is soft.     Tenderness: There is no abdominal tenderness.  Musculoskeletal:        General: Swelling present.     Cervical back: Neck supple.     Comments: Patient's right thumb is in a splint orthopedic has it in.  Patient has swelling to the left wrist.  That is been there for a while.  Patient with good range of motion of both shoulders.  But the right shoulder is fairly stiff when trying to place her hand on top of her head.  Radial pulses 2+ and good cap refill both hands.  Patient strength and sensation in hands is at baseline.  Nothing new.  Skin:    General: Skin is warm and dry.      Capillary Refill: Capillary refill takes less than 2 seconds.  Neurological:     Mental Status: She is alert and oriented to person, place, and time. Mental status is at baseline.    ED Results / Procedures / Treatments   Labs (all labs ordered are listed, but only abnormal results are displayed) Labs Reviewed - No data to display  EKG  None  Radiology DG Shoulder Right  Result Date: 09/20/2021 CLINICAL DATA:  Shoulder pain EXAM: RIGHT SHOULDER - 2+ VIEW COMPARISON:  None. FINDINGS: Glenohumeral degenerative change with cranial subluxation of the humeral head. Prior resection of the acromion. Negative for fracture IMPRESSION: Degenerative change in the shoulder joint. Prior acromioplasty. Electronically Signed   By: Franchot Gallo M.D.   On: 09/20/2021 17:02   DG Shoulder Left  Result Date: 09/20/2021 CLINICAL DATA:  Shoulder pain EXAM: LEFT SHOULDER - 2+ VIEW COMPARISON:  None. FINDINGS: Prior acromioplasty. Mild degenerative change in the greater tuberosity. Glenohumeral joint normal. No fracture or dislocation. IMPRESSION: Prior acromioplasty.  Mild degenerative change. Electronically Signed   By: Franchot Gallo M.D.   On: 09/20/2021 17:03    Procedures Procedures   Medications Ordered in ED Medications  HYDROcodone-acetaminophen (NORCO/VICODIN) 5-325 MG per tablet 1 tablet (1 tablet Oral Patient Refused/Not Given 09/20/21 1633)    ED Course  I have reviewed the triage vital signs and the nursing notes.  Pertinent labs & imaging results that were available during my care of the patient were reviewed by me and considered in my medical decision making (see chart for details).    MDM Rules/Calculators/A&P                           Patient's work-up here x-rays of both shoulders show degenerative changes.  Patient may have a little bit of rotator cuff pain related to the right shoulder.  No significant bony abnormalities.  We will treat symptomatically and have her follow-up with  [facility]   Final Clinical Impression(s) / ED Diagnoses Final diagnoses:  Acute pain of both shoulders    Rx / DC Orders ED Discharge Orders          Ordered    HYDROcodone-acetaminophen (NORCO/VICODIN) 5-325 MG tablet  Every 6 hours PRN        09/20/21 1737             Fredia Sorrow, MD 09/20/21 1737

## 2021-09-20 NOTE — Discharge Instructions (Addendum)
X-rays of both shoulders does show degenerative changes.  Make appointment to follow-up with [facility].  Take the pain medicine as needed.

## 2021-12-27 ENCOUNTER — Emergency Department (HOSPITAL_BASED_OUTPATIENT_CLINIC_OR_DEPARTMENT_OTHER)
Admission: EM | Admit: 2021-12-27 | Discharge: 2021-12-27 | Disposition: A | Discharge status: Home / Self Care | Payer: Federal, State, Local not specified - PPO | Attending: Emergency Medicine | Admitting: Emergency Medicine

## 2021-12-27 ENCOUNTER — Other Ambulatory Visit: Payer: Self-pay

## 2021-12-27 ENCOUNTER — Encounter (HOSPITAL_BASED_OUTPATIENT_CLINIC_OR_DEPARTMENT_OTHER): Payer: Self-pay

## 2021-12-27 DIAGNOSIS — M2550 Pain in unspecified joint: Secondary | ICD-10-CM

## 2021-12-27 DIAGNOSIS — M25551 Pain in right hip: Secondary | ICD-10-CM | POA: Diagnosis present

## 2021-12-27 MED ORDER — OXYCODONE HCL 5 MG PO TABS: 5.0000 mg | ORAL_TABLET | Freq: Four times a day (QID) | ORAL | 0 refills | Status: DC | PRN

## 2021-12-27 MED ORDER — METHYLPREDNISOLONE 4 MG PO TBPK: ORAL_TABLET | ORAL | 0 refills | Status: DC

## 2021-12-27 NOTE — ED Triage Notes (Signed)
Patient here POV from Home with Leg Pain and Shoulder Pain.  Pain is located in Right Leg (Knee to Hip) and Bilateral Shoulders. Pain has been present for months and worsening since.  No Fevers. No Acute Injury or Trauma.   NAD noted during Triage. A&Ox4. GCS 15. Ambulatory with [facility].

## 2021-12-27 NOTE — Discharge Instructions (Addendum)
Recommend 1000 mg of Tylenol every 6 hours as needed for pain.  Recommend buying over-the-counter lidocaine patches.  Take Roxicodone as prescribed.  This is a narcotic pain medicine so do not mix with alcohol, drugs, dangerous activities including driving.

## 2021-12-27 NOTE — ED Provider Notes (Signed)
[facility] EMERGENCY DEPT Provider Note   CSN: [id number] Arrival date & time: 12/27/21  1723     History  Chief Complaint  Patient presents with   Shoulder Pain   Leg Pain    [facility] is a 71 y.o. female.  Patient states that she is here for acute on chronic pain to her right hip and right shoulder.  Denies any trauma.  Tylenol has not helped much.  Pain worse with movement.  The history is provided by the patient.  Shoulder Pain Location:  Shoulder Shoulder location:  R shoulder Pain details:    Quality:  Aching   Radiates to:  Does not radiate   Severity:  Mild   Onset quality:  Gradual   Timing:  Constant   Progression:  Waxing and waning Relieved by:  Acetaminophen Worsened by:  Nothing Associated symptoms: stiffness   Associated symptoms: no back pain, no decreased range of motion, no fatigue, no fever, no muscle weakness, no neck pain and no numbness   Leg Pain Location:  Hip Hip location:  R hip Pain details:    Timing:  Constant Chronicity:  New Associated symptoms: stiffness   Associated symptoms: no back pain, no fatigue, no fever, no muscle weakness, no neck pain and no numbness       Home Medications Prior to Admission medications   Medication Sig Start Date End Date Taking? Authorizing Provider  methylPREDNISolone (MEDROL DOSEPAK) 4 MG TBPK tablet Follow package insert 12/27/21  Yes Helayne Metsker, DO  oxyCODONE (ROXICODONE) 5 MG immediate release tablet Take 1 tablet (5 mg total) by mouth every 6 (six) hours as needed for up to 10 doses for severe pain or breakthrough pain. 12/27/21  Yes Moosa Bueche, DO  atorvastatin (LIPITOR) 40 MG tablet Take 40 mg by mouth every evening.    [provider]  Calcium Carbonate-Vit D-Min (CALCIUM 1200 PO) Take 2 tablets by mouth daily.    [provider]  hydrochlorothiazide (HYDRODIURIL) 25 MG tablet hydrochlorothiazide 25 mg tablet    [provider]  KLOR-CON M20  20 MEQ tablet Take 20 mEq by mouth daily. 07/24/16   [provider]  latanoprost (XALATAN) 0.005 % ophthalmic solution INSTILL 1 DROP IN BOTH EYES NIGHTLY 12/09/15   [provider]  Multiple Vitamin (MULTIVITAMIN WITH MINERALS) TABS tablet Take 1 tablet by mouth daily.    [provider]  omeprazole (PRILOSEC) 20 MG capsule Take 20 mg by mouth daily as needed. For acid reflux. 05/08/16   [provider]  sertraline (ZOLOFT) 100 MG tablet Take 200 mg by mouth daily. 12/14/15   [provider]  valsartan (DIOVAN) 320 MG tablet valsartan 320 mg tablet  TAKE 1 TABLET BY MOUTH EVERY DAY    [provider]      Allergies    Sulfa antibiotics    Review of Systems   Review of Systems  Constitutional:  Negative for fatigue and fever.  Musculoskeletal:  Positive for stiffness. Negative for back pain and neck pain.   Physical Exam Updated Vital Signs BP 118/82 (BP Location: Right Arm)    Pulse 89    Temp 97.9 F (36.6 C)    Resp 16    Ht 5\' 1"  (1.549 m)    Wt 63.5 kg    SpO2 98%    BMI 26.45 kg/m  Physical Exam Vitals and nursing note reviewed.  Constitutional:      General: She is not in acute distress.  Appearance: She is well-developed.  HENT:     Head: Normocephalic and atraumatic.  Eyes:     Conjunctiva/sclera: Conjunctivae normal.  Cardiovascular:     Rate and Rhythm: Normal rate and regular rhythm.     Pulses: Normal pulses.     Heart sounds: No murmur heard. Pulmonary:     Effort: Pulmonary effort is normal. No respiratory distress.     Breath sounds: Normal breath sounds.  Abdominal:     Palpations: Abdomen is soft.     Tenderness: There is no abdominal tenderness.  Musculoskeletal:        General: Tenderness present. No swelling. Normal range of motion.     Cervical back: Normal range of motion and neck supple. No tenderness.     Comments: Tenderness to the right shoulder right hip but good range of motion  Skin:     General: Skin is warm and dry.     Capillary Refill: Capillary refill takes less than 2 seconds.  Neurological:     General: No focal deficit present.     Mental Status: She is alert and oriented to person, place, and time.     Cranial Nerves: No cranial nerve deficit.     Sensory: No sensory deficit.     Motor: No weakness.     Coordination: Coordination normal.  Psychiatric:        Mood and Affect: Mood normal.    ED Results / Procedures / Treatments   Labs (all labs ordered are listed, but only abnormal results are displayed) Labs Reviewed - No data to display  EKG None  Radiology No results found.  Procedures Procedures    Medications Ordered in ED Medications - No data to display  ED Course/ Medical Decision Making/ A&P                           Medical Decision Making Risk Prescription drug management.   [facility] is here with acute on chronic pain to her right hip and right shoulder.  Denies any trauma or falls.  Normal vitals.  No fever.  States that Tylenol has not been helping.  States that she is having a bad flare of pain.  She has good range of motion on exam of these joints.  She is tender to palpation in these areas but otherwise she appears well.  She is neurovascular and neuromuscular intact.  I have no concern for fracture or other acute process.  She did not want x-rays which I think is reasonable.  Could be muscle spasm versus arthritic pain.  We will treat with Medrol Dosepak and have her continue Tylenol and take Roxicodone for breakthrough pain.  Recommend follow-up with primary care doctor.  Discharged in good condition.  This chart was dictated using voice recognition software.  Despite best efforts to proofread,  errors can occur which can change the documentation meaning.         Final Clinical Impression(s) / ED Diagnoses Final diagnoses:  Arthralgia, unspecified joint    Rx / DC Orders ED Discharge Orders          Ordered     oxyCODONE (ROXICODONE) 5 MG immediate release tablet  Every 6 hours PRN        12/27/21 1828    methylPREDNISolone (MEDROL DOSEPAK) 4 MG TBPK tablet        12/27/21 1828  Lennice Sites, DO 12/27/21 1831

## 2022-03-11 ENCOUNTER — Other Ambulatory Visit: Payer: Self-pay | Admitting: Neurosurgery

## 2022-03-11 DIAGNOSIS — G8929 Other chronic pain: Secondary | ICD-10-CM

## 2022-03-20 ENCOUNTER — Ambulatory Visit
Admission: RE | Admit: 2022-03-20 | Discharge: 2022-03-20 | Disposition: A | Discharge status: Home / Self Care | Payer: Federal, State, Local not specified - PPO | Source: Ambulatory Visit | Attending: Neurosurgery | Admitting: Neurosurgery

## 2022-03-20 DIAGNOSIS — G8929 Other chronic pain: Secondary | ICD-10-CM

## 2022-03-20 MED ORDER — MEPERIDINE HCL 50 MG/ML IJ SOLN
50.0000 mg | Freq: Once | INTRAMUSCULAR | Status: AC | PRN
  Administered 2022-03-20: 50 mg via INTRAMUSCULAR

## 2022-03-20 MED ORDER — DIAZEPAM 5 MG PO TABS
5.0000 mg | ORAL_TABLET | Freq: Once | ORAL | Status: AC
  Administered 2022-03-20: 5 mg via ORAL

## 2022-03-20 MED ORDER — IOPAMIDOL (ISOVUE-M 200) INJECTION 41%
15.0000 mL | Freq: Once | INTRAMUSCULAR | Status: AC
  Administered 2022-03-20: 15 mL via INTRATHECAL

## 2022-03-20 MED ORDER — ONDANSETRON HCL 4 MG/2ML IJ SOLN
4.0000 mg | Freq: Once | INTRAMUSCULAR | Status: AC | PRN
  Administered 2022-03-20: 4 mg via INTRAMUSCULAR

## 2022-03-20 NOTE — Progress Notes (Signed)
Pt has spinal cord stimulator and reports she has turned it off for her CT myelogram procedure.  

## 2022-03-20 NOTE — Discharge Instr - Other Orders (Addendum)
1140: pain 8/10 post myelogram procedure ?1200: pt reports pain is much better. Pain now 4/10. Pt will remain in nursing recovery area until d/c ? ?

## 2022-03-20 NOTE — Discharge Instructions (Signed)

## 2022-05-01 ENCOUNTER — Other Ambulatory Visit: Payer: Self-pay | Admitting: Neurosurgery

## 2022-05-04 ENCOUNTER — Other Ambulatory Visit: Payer: Self-pay | Admitting: Neurosurgery

## 2022-05-22 NOTE — Pre-Procedure Instructions (Signed)
Surgical Instructions    Your procedure is scheduled on Thursday, July 13th.  Report to [facility] Main Entrance "A" at 5:30 A.M., then check in with the Admitting office.  Call this number if you have problems the morning of surgery:  [phone]   If you have any questions prior to your surgery date call [phone]: Open Monday-Friday 8am-4pm    Remember:  Do not eat or drink after midnight the night before your surgery   Take these medicines the morning of surgery with A SIP OF WATER:  sertraline (ZOLOFT) methylPREDNISolone (MEDROL DOSEPAK)   Take these medications as needed: omeprazole (PRILOSEC)  oxyCODONE (ROXICODONE)  As of today, STOP taking any Aspirin (unless otherwise instructed by your surgeon) Aleve, Naproxen, Ibuprofen, Motrin, Advil, Goody's, BC's, all herbal medications, fish oil, and all vitamins.           Do not wear jewelry or makeup Do not wear lotions, powders, perfumes, or deodorant. Do not shave 48 hours prior to surgery.  Do not bring valuables to the hospital. Do not wear nail polish, gel polish, artificial nails, or any other type of covering on natural nails (fingers and toes) If you have artificial nails or gel coating that need to be removed by a nail salon, please have this removed prior to surgery. Artificial nails or gel coating may interfere with anesthesia's ability to adequately monitor your vital signs.  [facility] is not responsible for any belongings or valuables. .   Do NOT Smoke (Tobacco/Vaping)  24 hours prior to your procedure  If you use a CPAP at night, you may bring your mask for your overnight stay.   Contacts, glasses, hearing aids, dentures or partials may not be worn into surgery, please bring cases for these belongings   For patients admitted to the hospital, discharge time will be determined by your treatment team.   Patients discharged the day of surgery will not be allowed to drive home, and someone needs to stay with  them for 24 hours.   SURGICAL WAITING ROOM VISITATION Patients having surgery or a procedure in a hospital may have two support people. Children under the age of 59 must have an adult with them who is not the patient. They may stay in the waiting area during the procedure and may switch out with other visitors. If the patient needs to stay at the hospital during part of their recovery, the visitor guidelines for inpatient rooms apply.  Please refer to the [facility] website for the visitor guidelines for Inpatients (after your surgery is over and you are in a regular room).       Special instructions:    Oral Hygiene is also important to reduce your risk of infection.  Remember - BRUSH YOUR TEETH THE MORNING OF SURGERY WITH YOUR REGULAR TOOTHPASTE   [facility]- Preparing For Surgery  Before surgery, you can play an important role. Because skin is not sterile, your skin needs to be as free of germs as possible. You can reduce the number of germs on your skin by washing with CHG (chlorahexidine gluconate) Soap before surgery.  CHG is an antiseptic cleaner which kills germs and bonds with the skin to continue killing germs even after washing.     Please do not use if you have an allergy to CHG or antibacterial soaps. If your skin becomes reddened/irritated stop using the CHG.  Do not shave (including legs and underarms) for at least 48 hours prior to first CHG shower.  It is OK to shave your face.  Please follow these instructions carefully.     Shower the NIGHT BEFORE SURGERY and the MORNING OF SURGERY with CHG Soap.   If you chose to wash your hair, wash your hair first as usual with your normal shampoo. After you shampoo, rinse your hair and body thoroughly to remove the shampoo.  Then [employer] and genitals (private parts) with your normal soap and rinse thoroughly to remove soap.  After that Use CHG Soap as you would any other liquid soap. You can apply CHG directly to the skin and  wash gently with a scrungie or a clean washcloth.   Apply the CHG Soap to your body ONLY FROM THE NECK DOWN.  Do not use on open wounds or open sores. Avoid contact with your eyes, ears, mouth and genitals (private parts). Wash Face and genitals (private parts)  with your normal soap.   Wash thoroughly, paying special attention to the area where your surgery will be performed.  Thoroughly rinse your body with warm water from the neck down.  DO NOT shower/wash with your normal soap after using and rinsing off the CHG Soap.  Pat yourself dry with a CLEAN TOWEL.  Wear CLEAN PAJAMAS to bed the night before surgery  Place CLEAN SHEETS on your bed the night before your surgery  DO NOT SLEEP WITH PETS.   Day of Surgery:  Take a shower with CHG soap. Wear Clean/Comfortable clothing the morning of surgery Do not apply any deodorants/lotions.   Remember to brush your teeth WITH YOUR REGULAR TOOTHPASTE.    If you received a COVID test during your pre-op visit, it is requested that you wear a mask when out in public, stay away from anyone that may not be feeling well, and notify your surgeon if you develop symptoms. If you have been in contact with anyone that has tested positive in the last 10 days, please notify your surgeon.    Please read over the following fact sheets that you were given.

## 2022-05-25 ENCOUNTER — Other Ambulatory Visit: Payer: Self-pay

## 2022-05-25 ENCOUNTER — Encounter (HOSPITAL_COMMUNITY): Payer: Self-pay

## 2022-05-25 ENCOUNTER — Encounter (HOSPITAL_COMMUNITY)
Admission: RE | Admit: 2022-05-25 | Discharge: 2022-05-25 | Disposition: A | Discharge status: Home / Self Care | Payer: Federal, State, Local not specified - PPO | Source: Ambulatory Visit | Attending: Neurosurgery | Admitting: Neurosurgery

## 2022-05-25 VITALS — BP 126/78 | HR 100 | Temp 98.2°F | Resp 17 | Ht 61.0 in | Wt 150.6 lb

## 2022-05-25 DIAGNOSIS — I10 Essential (primary) hypertension: Secondary | ICD-10-CM | POA: Diagnosis not present

## 2022-05-25 DIAGNOSIS — Z01818 Encounter for other preprocedural examination: Secondary | ICD-10-CM | POA: Diagnosis present

## 2022-05-25 LAB — CBC
HCT: 36 % (ref 36.0–46.0)
Hemoglobin: 11.8 g/dL — ABNORMAL LOW (ref 12.0–15.0)
MCH: 28.2 pg (ref 26.0–34.0)
MCHC: 32.8 g/dL (ref 30.0–36.0)
MCV: 85.9 fL (ref 80.0–100.0)
Platelets: 155 10*3/uL (ref 150–400)
RBC: 4.19 MIL/uL (ref 3.87–5.11)
RDW: 16.1 % — ABNORMAL HIGH (ref 11.5–15.5)
WBC: 5 10*3/uL (ref 4.0–10.5)
nRBC: 0 % (ref 0.0–0.2)

## 2022-05-25 LAB — BASIC METABOLIC PANEL
Anion gap: 9 (ref 5–15)
BUN: 29 mg/dL — ABNORMAL HIGH (ref 8–23)
CO2: 18 mmol/L — ABNORMAL LOW (ref 22–32)
Calcium: 8.9 mg/dL (ref 8.9–10.3)
Chloride: 112 mmol/L — ABNORMAL HIGH (ref 98–111)
Creatinine, Ser: 1.36 mg/dL — ABNORMAL HIGH (ref 0.44–1.00)
GFR, Estimated: 41 mL/min — ABNORMAL LOW (ref >60)
Glucose, Bld: 184 mg/dL — ABNORMAL HIGH (ref 70–99)
Potassium: 3.5 mmol/L (ref 3.5–5.1)
Sodium: 139 mmol/L (ref 135–145)

## 2022-05-25 LAB — TYPE AND SCREEN
ABO/RH(D): O POS
Antibody Screen: NEGATIVE

## 2022-05-25 LAB — SURGICAL PCR SCREEN
MRSA, PCR: NEGATIVE
Staphylococcus aureus: NEGATIVE

## 2022-05-25 NOTE — Progress Notes (Addendum)
PCP - Merrilee Seashore MD Cardiologist - Al Pimple MD- pt saw this MD once for evaluation of cardiovascular risk. No follow up needed.   PPM/ICD - denies Device Orders -  Rep Notified -   Chest x-ray - na EKG - 05/25/22 Stress Test - pt says she had one "years ago". No results found in chart review of care everywhere. ECHO - denies Cardiac Cath - denies  Sleep Study - none CPAP -   Fasting Blood Sugar - na Checks Blood Sugar _____ times a day  Blood Thinner Instructions:na Aspirin Instructions:na  ERAS Protcol -no PRE-SURGERY Ensure or G2-   COVID TEST- na   Anesthesia review: no  Patient denies shortness of breath, fever, cough and chest pain at PAT appointment   All instructions explained to the patient, with a verbal understanding of the material. Patient agrees to go over the instructions while at home for a better understanding. Patient also instructed to wear a mask when out in public prior to surgery. The opportunity to ask questions was provided.

## 2022-05-25 NOTE — Pre-Procedure Instructions (Signed)
Surgical Instructions    Your procedure is scheduled on Thursday, July 13th.  Report to [facility] [place] Main Entrance "A" at 5:30 A.M., then check in with the Admitting office.  Call this number if you have problems the morning of surgery:  [phone]   If you have any questions prior to your surgery date call [phone]: Open Monday-Friday 8am-4pm    Remember:  Do not eat or drink after midnight the night before your surgery   Take these medicines the morning of surgery with A SIP OF WATER:  sertraline (ZOLOFT)    Take these medications as needed: acetaminophen (TYLENOL) omeprazole (PRILOSEC)  oxyCODONE (ROXICODONE)  As of today, STOP taking any Aspirin (unless otherwise instructed by your surgeon),celecoxib (CELEBREX)  Aleve, Naproxen, Ibuprofen, Motrin, Advil, Goody's, BC's, all herbal medications, fish oil, and all vitamins and diclofenac Sodium (VOLTAREN) 1 % GEL           Do not wear jewelry or makeup Do not wear lotions, powders, perfumes, or deodorant. Do not shave 48 hours prior to surgery.  Do not bring valuables to the hospital. Do not wear nail polish, gel polish, artificial nails, or any other type of covering on natural nails (fingers and toes) If you have artificial nails or gel coating that need to be removed by a nail salon, please have this removed prior to surgery. Artificial nails or gel coating may interfere with anesthesia's ability to adequately monitor your vital signs.  [facility] is not responsible for any belongings or valuables. .   Do NOT Smoke (Tobacco/Vaping)  24 hours prior to your procedure  If you use a CPAP at night, you may bring your mask for your overnight stay.   Contacts, glasses, hearing aids, dentures or partials may not be worn into surgery, please bring cases for these belongings   For patients admitted to the hospital, discharge time will be determined by your treatment team.   Patients discharged the day of surgery will not be  allowed to drive home, and someone needs to stay with them for 24 hours.   SURGICAL WAITING ROOM VISITATION Patients having surgery or a procedure in a hospital may have two support people. Children under the age of 45 must have an adult with them who is not the patient. They may stay in the waiting area during the procedure and may switch out with other visitors. If the patient needs to stay at the hospital during part of their recovery, the visitor guidelines for inpatient rooms apply.  Please refer to the [facility] website for the visitor guidelines for Inpatients (after your surgery is over and you are in a regular room).       Special instructions:    Oral Hygiene is also important to reduce your risk of infection.  Remember - BRUSH YOUR TEETH THE MORNING OF SURGERY WITH YOUR REGULAR TOOTHPASTE   [facility]- Preparing For Surgery  Before surgery, you can play an important role. Because skin is not sterile, your skin needs to be as free of germs as possible. You can reduce the number of germs on your skin by washing with CHG (chlorahexidine gluconate) Soap before surgery.  CHG is an antiseptic cleaner which kills germs and bonds with the skin to continue killing germs even after washing.     Please do not use if you have an allergy to CHG or antibacterial soaps. If your skin becomes reddened/irritated stop using the CHG.  Do not shave (including legs and underarms) for  at least 48 hours prior to first CHG shower. It is OK to shave your face.  Please follow these instructions carefully.     Shower the NIGHT BEFORE SURGERY and the MORNING OF SURGERY with CHG Soap.   If you chose to wash your hair, wash your hair first as usual with your normal shampoo. After you shampoo, rinse your hair and body thoroughly to remove the shampoo.  Then [employer] and genitals (private parts) with your normal soap and rinse thoroughly to remove soap.  After that Use CHG Soap as you would any other  liquid soap. You can apply CHG directly to the skin and wash gently with a scrungie or a clean washcloth.   Apply the CHG Soap to your body ONLY FROM THE NECK DOWN.  Do not use on open wounds or open sores. Avoid contact with your eyes, ears, mouth and genitals (private parts). Wash Face and genitals (private parts)  with your normal soap.   Wash thoroughly, paying special attention to the area where your surgery will be performed.  Thoroughly rinse your body with warm water from the neck down.  DO NOT shower/wash with your normal soap after using and rinsing off the CHG Soap.  Pat yourself dry with a CLEAN TOWEL.  Wear CLEAN PAJAMAS to bed the night before surgery  Place CLEAN SHEETS on your bed the night before your surgery  DO NOT SLEEP WITH PETS.   Day of Surgery:  Take a shower with CHG soap. Wear Clean/Comfortable clothing the morning of surgery Do not apply any deodorants/lotions.   Remember to brush your teeth WITH YOUR REGULAR TOOTHPASTE.    If you received a COVID test during your pre-op visit, it is requested that you wear a mask when out in public, stay away from anyone that may not be feeling well, and notify your surgeon if you develop symptoms. If you have been in contact with anyone that has tested positive in the last 10 days, please notify your surgeon.    Please read over the following fact sheets that you were given.

## 2022-05-27 NOTE — Anesthesia Preprocedure Evaluation (Addendum)
Anesthesia Evaluation  Patient identified by MRN, date of birth, ID band Patient awake    Reviewed: Allergy & Precautions, NPO status , Patient's Chart, lab work & pertinent test results  Airway Mallampati: II  TM Distance: >3 FB Neck ROM: Full    Dental  (+) Missing, Dental Advisory Given   Pulmonary    Pulmonary exam normal breath sounds clear to auscultation       Cardiovascular hypertension, Pt. on medications  Rhythm:Regular Rate:Tachycardia     Neuro/Psych  Headaches, PSYCHIATRIC DISORDERS Anxiety Depression    GI/Hepatic GERD  Medicated and Controlled,  Endo/Other    Renal/GU      Musculoskeletal  (+) Arthritis ,   Abdominal   Peds  Hematology  (+) Blood dyscrasia, anemia ,   Anesthesia Other Findings   Reproductive/Obstetrics                            Anesthesia Physical  Anesthesia Plan  ASA: 2  Anesthesia Plan: General   Post-op Pain Management: Tylenol PO (pre-op)*, Gabapentin PO (pre-op)* and Sufentanil infusion   Induction: Intravenous  PONV Risk Score and Plan: 4 or greater and Ondansetron, Dexamethasone and Treatment may vary due to age or medical condition  Airway Management Planned: Oral ETT  Additional Equipment: None  Intra-op Plan:   Post-operative Plan: Extubation in OR  Informed Consent: I have reviewed the patients History and Physical, chart, labs and discussed the procedure including the risks, benefits and alternatives for the proposed anesthesia with the patient or authorized representative who has indicated his/her understanding and acceptance.     Dental advisory given  Plan Discussed with: CRNA  Anesthesia Plan Comments:        Anesthesia Quick Evaluation

## 2022-05-28 ENCOUNTER — Inpatient Hospital Stay (HOSPITAL_COMMUNITY)
Admission: RE | Admit: 2022-05-28 | Discharge: 2022-05-30 | DRG: 454 | Disposition: A | Discharge status: Home / Self Care | Payer: Medicare Other | Attending: Neurosurgery | Admitting: Neurosurgery

## 2022-05-28 ENCOUNTER — Other Ambulatory Visit: Payer: Self-pay

## 2022-05-28 ENCOUNTER — Inpatient Hospital Stay (HOSPITAL_COMMUNITY): Payer: Medicare Other | Admitting: Anesthesiology

## 2022-05-28 ENCOUNTER — Inpatient Hospital Stay (HOSPITAL_COMMUNITY)
Admission: RE | Disposition: A | Discharge status: Home / Self Care | Payer: Self-pay | Source: Home / Self Care | Attending: Neurosurgery

## 2022-05-28 ENCOUNTER — Inpatient Hospital Stay (HOSPITAL_COMMUNITY): Payer: Medicare Other

## 2022-05-28 ENCOUNTER — Encounter (HOSPITAL_COMMUNITY): Payer: Self-pay | Admitting: Neurosurgery

## 2022-05-28 DIAGNOSIS — F32A Depression, unspecified: Secondary | ICD-10-CM | POA: Diagnosis present

## 2022-05-28 DIAGNOSIS — Z882 Allergy status to sulfonamides status: Secondary | ICD-10-CM | POA: Diagnosis not present

## 2022-05-28 DIAGNOSIS — Z981 Arthrodesis status: Secondary | ICD-10-CM | POA: Diagnosis not present

## 2022-05-28 DIAGNOSIS — M4150 Other secondary scoliosis, site unspecified: Principal | ICD-10-CM

## 2022-05-28 DIAGNOSIS — M48062 Spinal stenosis, lumbar region with neurogenic claudication: Secondary | ICD-10-CM

## 2022-05-28 DIAGNOSIS — I10 Essential (primary) hypertension: Secondary | ICD-10-CM | POA: Diagnosis present

## 2022-05-28 DIAGNOSIS — R000 Tachycardia, unspecified: Secondary | ICD-10-CM | POA: Diagnosis present

## 2022-05-28 DIAGNOSIS — K219 Gastro-esophageal reflux disease without esophagitis: Secondary | ICD-10-CM | POA: Diagnosis present

## 2022-05-28 DIAGNOSIS — Z79899 Other long term (current) drug therapy: Secondary | ICD-10-CM | POA: Diagnosis not present

## 2022-05-28 DIAGNOSIS — M1990 Unspecified osteoarthritis, unspecified site: Secondary | ICD-10-CM | POA: Diagnosis present

## 2022-05-28 DIAGNOSIS — M4156 Other secondary scoliosis, lumbar region: Secondary | ICD-10-CM | POA: Diagnosis present

## 2022-05-28 DIAGNOSIS — M5116 Intervertebral disc disorders with radiculopathy, lumbar region: Secondary | ICD-10-CM

## 2022-05-28 DIAGNOSIS — Z01818 Encounter for other preprocedural examination: Secondary | ICD-10-CM

## 2022-05-28 DIAGNOSIS — H409 Unspecified glaucoma: Secondary | ICD-10-CM | POA: Diagnosis present

## 2022-05-28 DIAGNOSIS — D62 Acute posthemorrhagic anemia: Secondary | ICD-10-CM | POA: Diagnosis not present

## 2022-05-28 DIAGNOSIS — F419 Anxiety disorder, unspecified: Secondary | ICD-10-CM | POA: Diagnosis present

## 2022-05-28 SURGERY — POSTERIOR LUMBAR FUSION 2 LEVEL
Anesthesia: General | Site: Spine Lumbar

## 2022-05-28 MED ORDER — 0.9 % SODIUM CHLORIDE (POUR BTL) OPTIME
TOPICAL | Status: DC | PRN
  Administered 2022-05-28: 1000 mL

## 2022-05-28 MED ORDER — CEFAZOLIN SODIUM-DEXTROSE 2-4 GM/100ML-% IV SOLN
2.0000 g | Freq: Three times a day (TID) | INTRAVENOUS | Status: AC
  Administered 2022-05-28 – 2022-05-29 (×2): 2 g via INTRAVENOUS
  Filled 2022-05-28 (×2): qty 100

## 2022-05-28 MED ORDER — AMISULPRIDE (ANTIEMETIC) 5 MG/2ML IV SOLN: 5.0000 mg | Freq: Once | INTRAVENOUS | Status: DC | PRN

## 2022-05-28 MED ORDER — SODIUM CHLORIDE 0.9 % IV SOLN: INTRAVENOUS | Status: DC | PRN

## 2022-05-28 MED ORDER — VASOPRESSIN 20 UNIT/ML IV SOLN
INTRAVENOUS | Status: DC | PRN
  Administered 2022-05-28: 2 [IU] via INTRAVENOUS
  Administered 2022-05-28: 1 [IU] via INTRAVENOUS

## 2022-05-28 MED ORDER — LIDOCAINE 2% (20 MG/ML) 5 ML SYRINGE
INTRAMUSCULAR | Status: DC | PRN
  Administered 2022-05-28: 60 mg via INTRAVENOUS

## 2022-05-28 MED ORDER — ORAL CARE MOUTH RINSE: 15.0000 mL | Freq: Once | OROMUCOSAL | Status: AC

## 2022-05-28 MED ORDER — SODIUM CHLORIDE 0.9% FLUSH: 3.0000 mL | INTRAVENOUS | Status: DC | PRN

## 2022-05-28 MED ORDER — ACETAMINOPHEN 650 MG RE SUPP: 650.0000 mg | RECTAL | Status: DC | PRN

## 2022-05-28 MED ORDER — CHLORHEXIDINE GLUCONATE 0.12 % MT SOLN
15.0000 mL | Freq: Once | OROMUCOSAL | Status: AC
  Administered 2022-05-28: 15 mL via OROMUCOSAL
  Filled 2022-05-28: qty 15

## 2022-05-28 MED ORDER — LIDOCAINE 2% (20 MG/ML) 5 ML SYRINGE
INTRAMUSCULAR | Status: AC
Start: 2022-05-28 — End: ?
  Filled 2022-05-28: qty 5

## 2022-05-28 MED ORDER — PHENYLEPHRINE HCL (PRESSORS) 10 MG/ML IV SOLN
INTRAVENOUS | Status: AC
Start: 2022-05-28 — End: ?
  Filled 2022-05-28: qty 1

## 2022-05-28 MED ORDER — ZOLPIDEM TARTRATE 5 MG PO TABS: 5.0000 mg | ORAL_TABLET | Freq: Every evening | ORAL | Status: DC | PRN

## 2022-05-28 MED ORDER — ACETAMINOPHEN 10 MG/ML IV SOLN
INTRAVENOUS | Status: DC | PRN
  Administered 2022-05-28: 1000 mg via INTRAVENOUS

## 2022-05-28 MED ORDER — HYDROMORPHONE HCL 1 MG/ML IJ SOLN: 0.2500 mg | INTRAMUSCULAR | Status: DC | PRN

## 2022-05-28 MED ORDER — ONDANSETRON HCL 4 MG/2ML IJ SOLN: 4.0000 mg | Freq: Four times a day (QID) | INTRAMUSCULAR | Status: DC | PRN

## 2022-05-28 MED ORDER — PHENYLEPHRINE HCL-NACL 20-0.9 MG/250ML-% IV SOLN
INTRAVENOUS | Status: DC | PRN
  Administered 2022-05-28: 15 ug/min via INTRAVENOUS

## 2022-05-28 MED ORDER — ROCURONIUM BROMIDE 10 MG/ML (PF) SYRINGE
PREFILLED_SYRINGE | INTRAVENOUS | Status: DC | PRN
  Administered 2022-05-28: 80 mg via INTRAVENOUS
  Administered 2022-05-28: 40 mg via INTRAVENOUS
  Administered 2022-05-28: 60 mg via INTRAVENOUS

## 2022-05-28 MED ORDER — ROCURONIUM BROMIDE 10 MG/ML (PF) SYRINGE
PREFILLED_SYRINGE | INTRAVENOUS | Status: AC
  Filled 2022-05-28: qty 20

## 2022-05-28 MED ORDER — ONDANSETRON HCL 4 MG/2ML IJ SOLN
INTRAMUSCULAR | Status: AC
Start: 2022-05-28 — End: ?
  Filled 2022-05-28: qty 2

## 2022-05-28 MED ORDER — LACTATED RINGERS IV SOLN: INTRAVENOUS | Status: DC | PRN

## 2022-05-28 MED ORDER — SODIUM CHLORIDE 0.9% FLUSH
3.0000 mL | Freq: Two times a day (BID) | INTRAVENOUS | Status: DC
  Administered 2022-05-29: 3 mL via INTRAVENOUS

## 2022-05-28 MED ORDER — PROPOFOL 10 MG/ML IV BOLUS
INTRAVENOUS | Status: DC | PRN
  Administered 2022-05-28: 100 mg via INTRAVENOUS

## 2022-05-28 MED ORDER — ACETAMINOPHEN 10 MG/ML IV SOLN
INTRAVENOUS | Status: AC
  Filled 2022-05-28: qty 100

## 2022-05-28 MED ORDER — PHENOL 1.4 % MT LIQD: 1.0000 | OROMUCOSAL | Status: DC | PRN

## 2022-05-28 MED ORDER — DOCUSATE SODIUM 100 MG PO CAPS
100.0000 mg | ORAL_CAPSULE | Freq: Two times a day (BID) | ORAL | Status: DC
  Administered 2022-05-28 – 2022-05-30 (×4): 100 mg via ORAL
  Filled 2022-05-28 (×4): qty 1

## 2022-05-28 MED ORDER — FENTANYL CITRATE (PF) 250 MCG/5ML IJ SOLN
INTRAMUSCULAR | Status: AC
  Filled 2022-05-28: qty 5

## 2022-05-28 MED ORDER — BUPIVACAINE-EPINEPHRINE (PF) 0.5% -1:200000 IJ SOLN
INTRAMUSCULAR | Status: DC | PRN
  Administered 2022-05-28: 10 mL

## 2022-05-28 MED ORDER — BUPIVACAINE-EPINEPHRINE 0.5% -1:200000 IJ SOLN
INTRAMUSCULAR | Status: AC
Start: 2022-05-28 — End: ?
  Filled 2022-05-28: qty 1

## 2022-05-28 MED ORDER — BUPIVACAINE LIPOSOME 1.3 % IJ SUSP
INTRAMUSCULAR | Status: AC
  Filled 2022-05-28: qty 20

## 2022-05-28 MED ORDER — ONDANSETRON HCL 4 MG/2ML IJ SOLN
INTRAMUSCULAR | Status: DC | PRN
  Administered 2022-05-28: 4 mg via INTRAVENOUS

## 2022-05-28 MED ORDER — MENTHOL 3 MG MT LOZG
1.0000 | LOZENGE | OROMUCOSAL | Status: DC | PRN
  Administered 2022-05-29: 3 mg via ORAL
  Filled 2022-05-28 (×2): qty 9

## 2022-05-28 MED ORDER — PHENYLEPHRINE 80 MCG/ML (10ML) SYRINGE FOR IV PUSH (FOR BLOOD PRESSURE SUPPORT)
PREFILLED_SYRINGE | INTRAVENOUS | Status: DC | PRN
  Administered 2022-05-28: 80 ug via INTRAVENOUS

## 2022-05-28 MED ORDER — DEXAMETHASONE SODIUM PHOSPHATE 10 MG/ML IJ SOLN
INTRAMUSCULAR | Status: AC
Start: 2022-05-28 — End: ?
  Filled 2022-05-28: qty 1

## 2022-05-28 MED ORDER — ATORVASTATIN CALCIUM 40 MG PO TABS
40.0000 mg | ORAL_TABLET | Freq: Every evening | ORAL | Status: DC
  Administered 2022-05-28 – 2022-05-29 (×2): 40 mg via ORAL
  Filled 2022-05-28 (×2): qty 1

## 2022-05-28 MED ORDER — SODIUM CHLORIDE 0.9 % IV SOLN: 250.0000 mL | INTRAVENOUS | Status: DC

## 2022-05-28 MED ORDER — PHENYLEPHRINE HCL (PRESSORS) 10 MG/ML IV SOLN
INTRAVENOUS | Status: AC
  Filled 2022-05-28: qty 1

## 2022-05-28 MED ORDER — DEXAMETHASONE SODIUM PHOSPHATE 10 MG/ML IJ SOLN
INTRAMUSCULAR | Status: DC | PRN
  Administered 2022-05-28: 10 mg via INTRAVENOUS

## 2022-05-28 MED ORDER — THROMBIN 5000 UNITS EX SOLR
CUTANEOUS | Status: AC
Start: 2022-05-28 — End: ?
  Filled 2022-05-28: qty 5000

## 2022-05-28 MED ORDER — SERTRALINE HCL 50 MG PO TABS
200.0000 mg | ORAL_TABLET | Freq: Every day | ORAL | Status: DC
  Administered 2022-05-30: 200 mg via ORAL
  Filled 2022-05-28: qty 4

## 2022-05-28 MED ORDER — BACITRACIN ZINC 500 UNIT/GM EX OINT
TOPICAL_OINTMENT | CUTANEOUS | Status: AC
  Filled 2022-05-28: qty 28.35

## 2022-05-28 MED ORDER — THROMBIN 20000 UNITS EX SOLR: CUTANEOUS | Status: DC | PRN

## 2022-05-28 MED ORDER — LACTATED RINGERS IV SOLN: INTRAVENOUS | Status: DC

## 2022-05-28 MED ORDER — LATANOPROST 0.005 % OP SOLN
1.0000 [drp] | Freq: Every day | OPHTHALMIC | Status: DC
  Administered 2022-05-28 – 2022-05-29 (×2): 1 [drp] via OPHTHALMIC
  Filled 2022-05-28: qty 2.5

## 2022-05-28 MED ORDER — BISACODYL 10 MG RE SUPP: 10.0000 mg | Freq: Every day | RECTAL | Status: DC | PRN

## 2022-05-28 MED ORDER — ACETAMINOPHEN 500 MG PO TABS
1000.0000 mg | ORAL_TABLET | Freq: Four times a day (QID) | ORAL | Status: AC
  Administered 2022-05-28 – 2022-05-29 (×4): 1000 mg via ORAL
  Filled 2022-05-28 (×4): qty 2

## 2022-05-28 MED ORDER — FENTANYL CITRATE (PF) 250 MCG/5ML IJ SOLN
INTRAMUSCULAR | Status: DC | PRN
  Administered 2022-05-28 (×5): 50 ug via INTRAVENOUS
  Administered 2022-05-28: 100 ug via INTRAVENOUS

## 2022-05-28 MED ORDER — THROMBIN 20000 UNITS EX SOLR
CUTANEOUS | Status: AC
  Filled 2022-05-28: qty 20000

## 2022-05-28 MED ORDER — HYDROCHLOROTHIAZIDE 25 MG PO TABS
25.0000 mg | ORAL_TABLET | Freq: Every day | ORAL | Status: DC
  Administered 2022-05-29 – 2022-05-30 (×2): 25 mg via ORAL
  Filled 2022-05-28 (×2): qty 1

## 2022-05-28 MED ORDER — SUGAMMADEX SODIUM 200 MG/2ML IV SOLN
INTRAVENOUS | Status: DC | PRN
  Administered 2022-05-28: 200 mg via INTRAVENOUS

## 2022-05-28 MED ORDER — ACETAMINOPHEN 500 MG PO TABS
1000.0000 mg | ORAL_TABLET | Freq: Once | ORAL | Status: AC
  Administered 2022-05-28: 1000 mg via ORAL
  Filled 2022-05-28: qty 2

## 2022-05-28 MED ORDER — BACITRACIN ZINC 500 UNIT/GM EX OINT
TOPICAL_OINTMENT | CUTANEOUS | Status: DC | PRN
  Administered 2022-05-28: 1 via TOPICAL

## 2022-05-28 MED ORDER — CEFAZOLIN SODIUM-DEXTROSE 2-4 GM/100ML-% IV SOLN
2.0000 g | INTRAVENOUS | Status: AC
  Administered 2022-05-28: 2 g via INTRAVENOUS
  Filled 2022-05-28: qty 100

## 2022-05-28 MED ORDER — OXYCODONE HCL 5 MG PO TABS
10.0000 mg | ORAL_TABLET | ORAL | Status: DC | PRN
  Administered 2022-05-28 – 2022-05-30 (×10): 10 mg via ORAL
  Filled 2022-05-28 (×10): qty 2

## 2022-05-28 MED ORDER — OXYCODONE HCL 5 MG PO TABS: 5.0000 mg | ORAL_TABLET | ORAL | Status: DC | PRN

## 2022-05-28 MED ORDER — CHLORHEXIDINE GLUCONATE CLOTH 2 % EX PADS: 6.0000 | MEDICATED_PAD | Freq: Once | CUTANEOUS | Status: DC

## 2022-05-28 MED ORDER — VASOPRESSIN 20 UNIT/ML IV SOLN
INTRAVENOUS | Status: AC
  Filled 2022-05-28: qty 1

## 2022-05-28 MED ORDER — ONDANSETRON HCL 4 MG PO TABS: 4.0000 mg | ORAL_TABLET | Freq: Four times a day (QID) | ORAL | Status: DC | PRN

## 2022-05-28 MED ORDER — GABAPENTIN 300 MG PO CAPS
300.0000 mg | ORAL_CAPSULE | Freq: Once | ORAL | Status: AC
  Administered 2022-05-28: 300 mg via ORAL
  Filled 2022-05-28: qty 1

## 2022-05-28 MED ORDER — CYCLOBENZAPRINE HCL 10 MG PO TABS
10.0000 mg | ORAL_TABLET | Freq: Three times a day (TID) | ORAL | Status: DC | PRN
  Administered 2022-05-28 – 2022-05-30 (×4): 10 mg via ORAL
  Filled 2022-05-28 (×4): qty 1

## 2022-05-28 MED ORDER — MORPHINE SULFATE (PF) 4 MG/ML IV SOLN: 4.0000 mg | INTRAVENOUS | Status: DC | PRN

## 2022-05-28 MED ORDER — ACETAMINOPHEN 325 MG PO TABS
650.0000 mg | ORAL_TABLET | ORAL | Status: DC | PRN
  Administered 2022-05-29 – 2022-05-30 (×3): 650 mg via ORAL
  Filled 2022-05-28 (×3): qty 2

## 2022-05-28 MED ORDER — PANTOPRAZOLE SODIUM 20 MG PO TBEC
20.0000 mg | DELAYED_RELEASE_TABLET | Freq: Every day | ORAL | Status: DC
  Administered 2022-05-28 – 2022-05-30 (×2): 20 mg via ORAL
  Filled 2022-05-28 (×3): qty 1

## 2022-05-28 MED ORDER — PROPOFOL 10 MG/ML IV BOLUS
INTRAVENOUS | Status: AC
  Filled 2022-05-28: qty 20

## 2022-05-28 MED ORDER — THROMBIN 5000 UNITS EX SOLR: OROMUCOSAL | Status: DC | PRN

## 2022-05-28 MED ORDER — IRBESARTAN 150 MG PO TABS
300.0000 mg | ORAL_TABLET | Freq: Every day | ORAL | Status: DC
  Administered 2022-05-28 – 2022-05-30 (×3): 300 mg via ORAL
  Filled 2022-05-28 (×3): qty 2

## 2022-05-28 SURGICAL SUPPLY — 65 items
BAG COUNTER SPONGE SURGICOUNT (BAG) ×3 IMPLANT
BAND RUBBER #18 3X1/16 STRL (MISCELLANEOUS) ×2 IMPLANT
BASKET BONE COLLECTION (BASKET) ×2 IMPLANT
BENZOIN TINCTURE PRP APPL 2/3 (GAUZE/BANDAGES/DRESSINGS) ×2 IMPLANT
BLADE CLIPPER SURG (BLADE) IMPLANT
BUR MATCHSTICK NEURO 3.0 LAGG (BURR) ×2 IMPLANT
BUR PRECISION FLUTE 6.0 (BURR) ×2 IMPLANT
CAGE ALTERA 10X31X9-13 15D (Cage) ×1 IMPLANT
CAGE SABLE 10X26 6-12 8D (Cage) ×1 IMPLANT
CANISTER SUCT 3000ML PPV (MISCELLANEOUS) ×2 IMPLANT
CAP REVERE LOCKING (Cap) ×8 IMPLANT
CARTRIDGE OIL MAESTRO DRILL (MISCELLANEOUS) ×1 IMPLANT
CNTNR URN SCR LID CUP LEK RST (MISCELLANEOUS) ×1 IMPLANT
CONT SPEC 4OZ STRL OR WHT (MISCELLANEOUS) ×2
COVER BACK TABLE 60X90IN (DRAPES) ×2 IMPLANT
DIFFUSER DRILL AIR PNEUMATIC (MISCELLANEOUS) ×2 IMPLANT
DRAPE C-ARM 42X72 X-RAY (DRAPES) ×5 IMPLANT
DRAPE HALF SHEET 40X57 (DRAPES) ×3 IMPLANT
DRAPE LAPAROTOMY 100X72X124 (DRAPES) ×2 IMPLANT
DRAPE MICROSCOPE LEICA (MISCELLANEOUS) ×1 IMPLANT
DRSG OPSITE POSTOP 4X6 (GAUZE/BANDAGES/DRESSINGS) ×2 IMPLANT
DRSG OPSITE POSTOP 4X8 (GAUZE/BANDAGES/DRESSINGS) ×1 IMPLANT
ELECT BLADE 4.0 EZ CLEAN MEGAD (MISCELLANEOUS) ×2
ELECT REM PT RETURN 9FT ADLT (ELECTROSURGICAL) ×2
ELECTRODE BLDE 4.0 EZ CLN MEGD (MISCELLANEOUS) ×1 IMPLANT
ELECTRODE REM PT RTRN 9FT ADLT (ELECTROSURGICAL) ×1 IMPLANT
GLOVE BIO SURGEON STRL SZ 6 (GLOVE) ×2 IMPLANT
GLOVE BIO SURGEON STRL SZ8 (GLOVE) ×4 IMPLANT
GLOVE BIO SURGEON STRL SZ8.5 (GLOVE) ×4 IMPLANT
GLOVE BIOGEL PI IND STRL 6.5 (GLOVE) ×1 IMPLANT
GLOVE BIOGEL PI INDICATOR 6.5 (GLOVE) ×1
GLOVE EXAM NITRILE XL STR (GLOVE) IMPLANT
GOWN STRL REUS W/ TWL LRG LVL3 (GOWN DISPOSABLE) ×1 IMPLANT
GOWN STRL REUS W/ TWL XL LVL3 (GOWN DISPOSABLE) ×2 IMPLANT
GOWN STRL REUS W/TWL 2XL LVL3 (GOWN DISPOSABLE) IMPLANT
GOWN STRL REUS W/TWL LRG LVL3 (GOWN DISPOSABLE) ×6
GOWN STRL REUS W/TWL XL LVL3 (GOWN DISPOSABLE) ×4
HEMOSTAT POWDER KIT SURGIFOAM (HEMOSTASIS) ×3 IMPLANT
KIT BASIN OR (CUSTOM PROCEDURE TRAY) ×2 IMPLANT
KIT GRAFTMAG DEL NEURO DISP (NEUROSURGERY SUPPLIES) ×1 IMPLANT
KIT TURNOVER KIT B (KITS) ×2 IMPLANT
NDL HYPO 21X1.5 SAFETY (NEEDLE) IMPLANT
NEEDLE HYPO 21X1.5 SAFETY (NEEDLE) ×2 IMPLANT
NEEDLE HYPO 22GX1.5 SAFETY (NEEDLE) ×2 IMPLANT
NS IRRIG 1000ML POUR BTL (IV SOLUTION) ×2 IMPLANT
OIL CARTRIDGE MAESTRO DRILL (MISCELLANEOUS) ×2
PACK LAMINECTOMY NEURO (CUSTOM PROCEDURE TRAY) ×2 IMPLANT
PAD ARMBOARD 7.5X6 YLW CONV (MISCELLANEOUS) ×6 IMPLANT
PATTIES SURGICAL .5 X.5 (GAUZE/BANDAGES/DRESSINGS) ×2 IMPLANT
PUTTY DBM 5CC CALC GRAN (Putty) ×2 IMPLANT
ROD REVERE 6.35 CURVED 70MM (Rod) ×1 IMPLANT
ROD REVERE 7.5MM (Rod) ×1 IMPLANT
SCREW 7.5X50MM (Screw) ×4 IMPLANT
SPONGE SURGIFOAM ABS GEL 100 (HEMOSTASIS) ×1 IMPLANT
STRIP CLOSURE SKIN 1/2X4 (GAUZE/BANDAGES/DRESSINGS) ×2 IMPLANT
SUT PROLENE 6 0 BV (SUTURE) ×2 IMPLANT
SUT VIC AB 1 CT1 18XBRD ANBCTR (SUTURE) ×2 IMPLANT
SUT VIC AB 1 CT1 8-18 (SUTURE) ×4
SUT VIC AB 2-0 CP2 18 (SUTURE) ×4 IMPLANT
SYR 20ML LL LF (SYRINGE) ×1 IMPLANT
TAP SURG GLBU 6.5X40 (ORTHOPEDIC DISPOSABLE SUPPLIES) ×1 IMPLANT
TOWEL GREEN STERILE (TOWEL DISPOSABLE) ×2 IMPLANT
TOWEL GREEN STERILE FF (TOWEL DISPOSABLE) ×2 IMPLANT
TRAY FOLEY MTR SLVR 16FR STAT (SET/KITS/TRAYS/PACK) ×2 IMPLANT
WATER STERILE IRR 1000ML POUR (IV SOLUTION) ×2 IMPLANT

## 2022-05-28 NOTE — Progress Notes (Signed)
Dr. Lissa Hoard made aware of patient's elevated heart rate this morning. 112 and 108.

## 2022-05-28 NOTE — Transfer of Care (Signed)
Immediate Anesthesia Transfer of Care Note  Patient: [name]  Procedure(s) Performed: LUMBAR TWO-THREE, LUMBAR THREE-FOUR POSTERIOR LUMBAR INTERBODY FUSION WITH EXTENSION OF FUSION (Spine Lumbar)  Patient Location: PACU  Anesthesia Type:General  Level of Consciousness: drowsy  Airway & Oxygen Therapy: Patient Spontanous Breathing and Patient connected to nasal cannula oxygen  Post-op Assessment: Report given to RN, Post -op Vital signs reviewed and stable and Patient moving all extremities X 4  Post vital signs: Reviewed and stable  Last Vitals:  Vitals Value Taken Time  BP 135/63 05/28/22 1420  Temp 36.7 C 05/28/22 1415  Pulse 103 05/28/22 1422  Resp 25 05/28/22 1422  SpO2 94 % 05/28/22 1422  Vitals shown include unvalidated device data.  Last Pain:  Vitals:   05/28/22 1415  TempSrc:   PainSc: Asleep         Complications: No notable events documented.

## 2022-05-28 NOTE — Op Note (Signed)
Brief history: The patient is a 71 year old black female on whom I previously performed an L4-5 decompression, instrumentation and fusion.  She has developed recurrent back and leg pain.  She has failed medical management.  She was worked up with a lumbar myelo CT which demonstrated a degenerative scoliosis, degenerative disease, spinal stenosis, etc.  I discussed the various treatment options with her.  She has decided to proceed with surgery.  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 Etc. requiring a wide decompression of the nerve roots.);  L2-3 and L3-4 transforaminal lumbar interbody fusion with local morselized autograft bone and Zimmer DBM; insertion of interbody prosthesis at L2-3 and L3-4 (globus peek expandable interbody prosthesis); posterior segmental instrumentation from L2 to L5 with globus titanium pedicle screws and rods; posterior lateral arthrodesis at L2-3 and L3-4 with local morselized autograft bone and Zimmer DBM.  Surgeon: Dr. Earle Gell  Asst.: Dr. Kristeen Miss and Arnetha Massy, NP  Anesthesia: Gen. endotracheal  Estimated blood loss: 500 cc  Drains: None  Complications: None  Description of procedure: The patient was brought to the operating room by the anesthesia team. General endotracheal anesthesia was induced. The patient was turned to the prone position on the Wilson frame. The patient's lumbosacral region was then prepared with Betadine scrub and Betadine  solution. Sterile drapes were applied.  I then injected the area to be incised with Marcaine with epinephrine solution. I then used the scalpel to make a linear midline incision over the L2-3 and L3-4 interspace. I then used electrocautery to perform a bilateral subperiosteal dissection exposing the spinous process and lamina of L2-3, L3-4 and L4-5 and exposing the old hardware at L4-5.  We then inserted the Verstrac retractor to provide exposure.  We explored the fusion by removing the caps from the old screws and the movement and rods.  We inspected the arthrodesis at L4-5.  It appeared solid.  I began the decompression by using the high speed drill to perform laminotomies at L2-3 and L3-4 bilaterally. We then used the Kerrison punches to widen the laminotomy and removed the ligamentum flavum at L2-3 and L3-4 bilaterally. We used the Kerrison punches to remove the medial facets at L2-3 and L3-4 bilaterally, we removed the right L2-3 and L3-4 facet.. We performed wide foraminotomies about the bilateral L2, L3 and L4 nerve roots completing the decompression.  We now turned our attention to the posterior lumbar interbody fusion. I used a scalpel to incise the intervertebral disc at L2-3 and L3-4 bilaterally. I then performed a partial intervertebral discectomy at L2-3 and L3-4 bilaterally using the pituitary forceps. We prepared the vertebral endplates at I6-9 and G2-9 bilaterally for the fusion by removing the soft tissues with the curettes. We then used the trial spacers to pick the appropriate sized interbody prosthesis. We prefilled his prosthesis with a combination of local morselized autograft bone that we obtained during the decompression as well as Zimmer DBM. We inserted the prefilled prosthesis into the interspace at L2-3 and L3-4 from the right, we then turned and expanded the prosthesis. There was a good snug  fit of the prosthesis in the interspace. We then filled and the remainder of the  intervertebral disc space with local morselized autograft bone and Zimmer DBM. This completed the posterior lumbar interbody arthrodesis.  During the decompression and insertion of the prosthesis the assistant protected the thecal sac and nerve roots with the D'Errico retractor.  We created a small durotomy which we closed with a single figure-of-eight 6-0 Prolene suture.  We now turned attention to the instrumentation. Under fluoroscopic guidance we cannulated the bilateral L2 and L3 pedicles with the bone probe. We then removed the bone probe. We then tapped the pedicle with a 6.5 millimeter tap. We then removed the tap. We probed inside the tapped pedicle with a ball probe to rule out cortical breaches. We then inserted a 7.5 x 55 millimeter pedicle screw into the L2 and L3 pedicles bilaterally under fluoroscopic guidance. We then palpated along the medial aspect of the pedicles to rule out cortical breaches. There were none. The nerve roots were not injured. We then connected the unilateral pedicle screws with a lordotic rod. We compressed the construct and secured the rod in place with the caps. We then tightened the caps appropriately. This completed the instrumentation from L2-L5 bilaterally.  We now turned our attention to the posterior lateral arthrodesis at L2-3 and L3-4. We used the high-speed drill to decorticate the remainder of the facets, pars, transverse process at L2-3 and L3-4. We then applied a combination of local morselized autograft bone and Zimmer DBM over these decorticated posterior lateral structures. This completed the posterior lateral arthrodesis.  We then obtained hemostasis using bipolar electrocautery. We irrigated the wound out with bacitracin solution. We inspected the thecal sac and nerve roots and noted they were well decompressed. We then removed the retractor.  We injected Exparel . We reapproximated patient's thoracolumbar fascia with interrupted #1 Vicryl suture. We  reapproximated patient's subcutaneous tissue with interrupted 2-0 Vicryl suture. The reapproximated patient's skin with Steri-Strips and benzoin. The wound was then coated with bacitracin ointment. A sterile dressing was applied. The drapes were removed. The patient was subsequently returned to the supine position where they were extubated by the anesthesia team. He was then transported to the post anesthesia care unit in stable condition. All sponge instrument and needle counts were reportedly correct at the end of this case.

## 2022-05-28 NOTE — Anesthesia Procedure Notes (Signed)
Procedure Name: Intubation Date/Time: 05/28/2022 7:40 AM  Performed by: Kyung Rudd, CRNAPre-anesthesia Checklist: Patient identified, Emergency Drugs available, Suction available and Patient being monitored Patient Re-evaluated:Patient Re-evaluated prior to induction Oxygen Delivery Method: Circle system utilized Preoxygenation: Pre-oxygenation with 100% oxygen Induction Type: IV induction Ventilation: Mask ventilation without difficulty Laryngoscope Size: Mac and 3 Grade View: Grade I Tube type: Oral Tube size: 7.0 mm Number of attempts: 1 Airway Equipment and Method: Stylet Placement Confirmation: ETT inserted through vocal cords under direct vision, positive ETCO2 and breath sounds checked- equal and bilateral Secured at: 21 cm Tube secured with: Tape Dental Injury: Teeth and Oropharynx as per pre-operative assessment

## 2022-05-28 NOTE — Progress Notes (Signed)
Orthopedic Tech Progress Note Patient Details:  Alyssa Valentine 11/22/1950 [id number]  Ortho Devices Type of Ortho Device: Lumbar corsett Ortho Device/Splint Location: Back Ortho Device/Splint Interventions: Ordered      [name] Johnnye Sandford 05/28/2022, 5:08 PM

## 2022-05-28 NOTE — H&P (Signed)
Subjective: The patient is a 71 year old black female on whom I performed an L4-5 fusion years ago.  She has developed recurrent back and bilateral leg pain consistent with neurogenic claudication.  She failed medical management and was worked up with a lumbar myelo CT which demonstrated multilevel degenerative changes, degenerative scoliosis, stenosis, etc.  I discussed the various treatment options with her.  She has decided proceed with surgery.  Past Medical History:  Diagnosis Date   Anemia    many years ago   Anxiety    Arthritis    Depression    GERD (gastroesophageal reflux disease)    Glaucoma    Headache    hx of migraines, no longer have them   HTN (hypertension)     Past Surgical History:  Procedure Laterality Date   BACK SURGERY  2017   CARPAL TUNNEL RELEASE Right 07/17/1989   CARPAL TUNNEL RELEASE Left 01/04/2016   Procedure: CARPAL TUNNEL RELEASE;  Surgeon: Roseanne Kaufman, MD;  Location: [place];  Service: Orthopedics;  Laterality: Left;   CESAREAN SECTION  11/17/1983   COLONOSCOPY N/A 09/15/2013   Procedure: COLONOSCOPY;  Surgeon: Beryle Beams, MD;  Location: [facility];  Service: Endoscopy;  Laterality: N/A;   ESOPHAGOGASTRODUODENOSCOPY N/A 09/15/2013   Procedure: ESOPHAGOGASTRODUODENOSCOPY (EGD);  Surgeon: Beryle Beams, MD;  Location: [facility];  Service: Endoscopy;  Laterality: N/A;   ORIF RADIAL FRACTURE Left 01/04/2016   Procedure: OPEN REDUCTION INTERNAL FIXATION (ORIF) LEFT RADIAL FRACTURE;  Surgeon: Roseanne Kaufman, MD;  Location: [facility];  Service: Orthopedics;  Laterality: Left;   ROTATOR CUFF REPAIR Bilateral 2010 and 2013    Allergies  Allergen Reactions   Sulfa Antibiotics Itching and Swelling    Social History   Tobacco Use   Smoking status: Never   Smokeless tobacco: Never  Substance Use Topics   Alcohol use: Yes    Comment: occasional    History reviewed. No pertinent family history. Prior to Admission medications   Medication Sig  Start Date End Date Taking? Authorizing Provider  acetaminophen (TYLENOL) 500 MG tablet Take 1,000 mg by mouth daily as needed for mild pain.   Yes [provider]  Ascorbic Acid (VITAMIN C PO) Take 1 tablet by mouth daily.   Yes [provider]  atorvastatin (LIPITOR) 40 MG tablet Take 40 mg by mouth every evening.   Yes [provider]  CALCIUM PO Take 1 tablet by mouth daily.   Yes [provider]  celecoxib (CELEBREX) 200 MG capsule Take 200 mg by mouth daily.   Yes [provider]  Cholecalciferol (VITAMIN D-3 PO) Take 1 capsule by mouth daily.   Yes [provider]  diclofenac Sodium (VOLTAREN) 1 % GEL Apply 1 Application topically 2 (two) times daily as needed (pain).   Yes [provider]  hydrochlorothiazide (HYDRODIURIL) 25 MG tablet Take 25 mg by mouth daily.   Yes [provider]  latanoprost (XALATAN) 0.005 % ophthalmic solution Place 1 drop into both eyes at bedtime. 12/09/15  Yes [provider]  Multiple Vitamin (MULTIVITAMIN WITH MINERALS) TABS tablet Take 1 tablet by mouth daily.   Yes [provider]  omeprazole (PRILOSEC OTC) 20 MG tablet Take 20 mg by mouth daily.   Yes [provider]  sertraline (ZOLOFT) 100 MG tablet Take 200 mg by mouth daily. 12/14/15  Yes [provider]  valsartan (DIOVAN) 320 MG tablet Take 320 mg by mouth daily.   Yes [provider]  VITAMIN E  PO Take 1 capsule by mouth daily.   Yes [provider]  methylPREDNISolone (MEDROL DOSEPAK) 4 MG TBPK tablet Follow package insert Patient not taking: Reported on 05/25/2022 12/27/21   Lennice Sites, DO  oxyCODONE (ROXICODONE) 5 MG immediate release tablet Take 1 tablet (5 mg total) by mouth every 6 (six) hours as needed for up to 10 doses for severe pain or breakthrough pain. Patient not taking: Reported on 05/25/2022 12/27/21   Lennice Sites, DO     Review of Systems  Positive ROS: As  above  All other systems have been reviewed and were otherwise negative with the exception of those mentioned in the HPI and as above.  Objective: Vital signs in last 24 hours: Temp:  [98.2 F (36.8 C)] 98.2 F (36.8 C) (07/13 0610) Pulse Rate:  [108-112] 108 (07/13 0645) Resp:  [18] 18 (07/13 0610) BP: (161)/(96) 161/96 (07/13 0610) SpO2:  [97 %] 97 % (07/13 0610) Estimated body mass index is 28.46 kg/m as calculated from the following:   Height as of 05/25/22: '5\' 1"'$  (1.549 m).   Weight as of 05/25/22: 68.3 kg.   General Appearance: Alert Head: Normocephalic, without obvious abnormality, atraumatic Eyes: PERRL, conjunctiva/corneas clear, EOM's intact,    Ears: Normal  Throat: Normal  Neck: Supple, Back: Scoliotic, her incision is well-healed. Lungs: Clear to auscultation bilaterally, respirations unlabored Heart: Regular rate and rhythm, no murmur, rub or gallop Abdomen: Soft, non-tender Extremities: Extremities normal, atraumatic, no cyanosis or edema Skin: unremarkable  NEUROLOGIC:   Mental status: alert and oriented,Motor Exam - grossly normal Sensory Exam - grossly normal Reflexes:  Coordination - grossly normal Gait - grossly normal Balance - grossly normal Cranial Nerves: I: smell Not tested  II: visual acuity  OS: Normal  OD: Normal   II: visual fields Full to confrontation  II: pupils Equal, round, reactive to light  III,VII: ptosis None  III,IV,VI: extraocular muscles  Full ROM  V: mastication Normal  V: facial light touch sensation  Normal  V,VII: corneal reflex  Present  VII: facial muscle function - upper  Normal  VII: facial muscle function - lower Normal  VIII: hearing Not tested  IX: soft palate elevation  Normal  IX,X: gag reflex Present  XI: trapezius strength  5/5  XI: sternocleidomastoid strength 5/5  XI: neck flexion strength  5/5  XII: tongue strength  Normal    Data Review Lab Results  Component Value Date   WBC 5.0 05/25/2022    HGB 11.8 (L) 05/25/2022   HCT 36.0 05/25/2022   MCV 85.9 05/25/2022   PLT 155 05/25/2022   Lab Results  Component Value Date   NA 139 05/25/2022   K 3.5 05/25/2022   CL 112 (H) 05/25/2022   CO2 18 (L) 05/25/2022   BUN 29 (H) 05/25/2022   CREATININE 1.36 (H) 05/25/2022   GLUCOSE 184 (H) 05/25/2022   No results found for: "INR", "PROTIME"  Assessment/Plan: Adult degenerative scoliosis, lumbar degenerative disease, lumbar spinal stenosis, neurogenic claudication, lumbar radiculopathy, lumbago: I have discussed the situation with the patient.  I reviewed her imaging studies with her and pointed out the abnormalities.  We have discussed the various treatment options including surgery.  I described the surgical treatment option of an L2-3 and L3-4 decompression, instrumentation and fusion.  I have shown her surgical models.  I have given her a surgical pamphlet.  We have discussed the risk, benefits, alternatives, expected postop course, and likelihood of achieving our goals with surgery.  I have answered all her questions.  She has decided proceed with surgery.   Alyssa Valentine 05/28/2022 7:21 AM

## 2022-05-29 LAB — CBC
HCT: 23.4 % — ABNORMAL LOW (ref 36.0–46.0)
Hemoglobin: 7.8 g/dL — ABNORMAL LOW (ref 12.0–15.0)
MCH: 28.5 pg (ref 26.0–34.0)
MCHC: 33.3 g/dL (ref 30.0–36.0)
MCV: 85.4 fL (ref 80.0–100.0)
Platelets: 74 10*3/uL — ABNORMAL LOW (ref 150–400)
RBC: 2.74 MIL/uL — ABNORMAL LOW (ref 3.87–5.11)
RDW: 16.3 % — ABNORMAL HIGH (ref 11.5–15.5)
WBC: 5.7 10*3/uL (ref 4.0–10.5)
nRBC: 0 % (ref 0.0–0.2)

## 2022-05-29 LAB — BASIC METABOLIC PANEL
Anion gap: 7 (ref 5–15)
BUN: 15 mg/dL (ref 8–23)
CO2: 22 mmol/L (ref 22–32)
Calcium: 7.7 mg/dL — ABNORMAL LOW (ref 8.9–10.3)
Chloride: 110 mmol/L (ref 98–111)
Creatinine, Ser: 1.33 mg/dL — ABNORMAL HIGH (ref 0.44–1.00)
GFR, Estimated: 43 mL/min — ABNORMAL LOW (ref >60)
Glucose, Bld: 106 mg/dL — ABNORMAL HIGH (ref 70–99)
Potassium: 3.3 mmol/L — ABNORMAL LOW (ref 3.5–5.1)
Sodium: 139 mmol/L (ref 135–145)

## 2022-05-29 MED ORDER — FERROUS SULFATE 325 (65 FE) MG PO TABS: 325.0000 mg | ORAL_TABLET | Freq: Two times a day (BID) | ORAL | 0 refills | Status: AC

## 2022-05-29 MED ORDER — CYCLOBENZAPRINE HCL 5 MG PO TABS
5.0000 mg | ORAL_TABLET | Freq: Three times a day (TID) | ORAL | 0 refills | Status: DC | PRN
Start: 2022-05-29 — End: 2022-11-18

## 2022-05-29 MED ORDER — FERROUS SULFATE 325 (65 FE) MG PO TABS
325.0000 mg | ORAL_TABLET | Freq: Two times a day (BID) | ORAL | Status: DC
  Administered 2022-05-29 – 2022-05-30 (×2): 325 mg via ORAL
  Filled 2022-05-29 (×2): qty 1

## 2022-05-29 MED ORDER — OXYCODONE-ACETAMINOPHEN 5-325 MG PO TABS: 1.0000 | ORAL_TABLET | ORAL | 0 refills | Status: AC | PRN

## 2022-05-29 MED ORDER — DOCUSATE SODIUM 100 MG PO CAPS: 100.0000 mg | ORAL_CAPSULE | Freq: Two times a day (BID) | ORAL | 0 refills | Status: DC

## 2022-05-29 MED FILL — Thrombin For Soln 5000 Unit: CUTANEOUS | Qty: 5000 | Status: AC

## 2022-05-29 MED FILL — Sodium Chloride IV Soln 0.9%: INTRAVENOUS | Qty: 3000 | Status: AC

## 2022-05-29 MED FILL — Heparin Sodium (Porcine) Inj 1000 Unit/ML: INTRAMUSCULAR | Qty: 60 | Status: AC

## 2022-05-29 NOTE — Discharge Summary (Addendum)
Physician Discharge Summary     Providing Compassionate, Quality Care - Together   Patient ID: Alyssa Valentine MRN: [id number] DOB/AGE: [birth year] 71 y.o.  Admit date: 05/28/2022 Discharge date: 05/29/2022  Admission Diagnoses: Degenerative scoliosis in adult patient  Discharge Diagnoses:  Principal Problem:   Degenerative scoliosis in adult patient   Discharged Condition: good  Hospital Course: Patient underwent an L2-3, L3-4 PLIF by Dr. Arnoldo Morale on 05/28/2022. She was admitted to 3C08  following recovery from anesthesia in the PACU. Her postoperative course has been uncomplicated. She has worked with both physical and occupational therapies who feel the patient is ready for discharge home. She is ambulating independently and without difficulty. She is tolerating a normal diet. She is not having any bowel or bladder dysfunction. Her pain is well-controlled with oral pain medication. She is ready for discharge home.   Consults: PT/OT  Significant Diagnostic Studies: radiology: DG Lumbar Spine 2-3 Views  Result Date: 05/28/2022 CLINICAL DATA:  Lumbar spine lesion extension L2-L3 EXAM: LUMBAR SPINE - 2 VIEW COMPARISON:  None Available. FINDINGS: Fluoroscopic images were obtained intraoperatively and submitted for post operative interpretation. Prior L4-L5 posterior fusion with extension to L2-L3, 3 images were obtained with 32 seconds of fluoroscopy time and 20.4 mGy. Please see the performing provider's procedural report for further detail. IMPRESSION: Fluoroscopic images of lumbar spine posterior fusion. Electronically Signed   By: Yetta Glassman M.D.   On: 05/28/2022 13:52   DG C-Arm 1-60 Min-No Report  Result Date: 05/28/2022 Fluoroscopy was utilized by the requesting physician.  No radiographic interpretation.   DG C-Arm 1-60 Min-No Report  Result Date: 05/28/2022 Fluoroscopy was utilized by the requesting physician.  No radiographic interpretation.     Treatments: surgery:  Left L2-3 and L3-4 laminotomy/foraminotomies/medial facetectomy to decompress the bilateral L2, L3 and L4 nerve roots(the work required to do this was in addition to the work required to do the posterior lumbar interbody fusion because of the patient's spinal stenosis, facet arthropathy. Etc. requiring a wide decompression of the nerve roots.);  L2-3 and L3-4 transforaminal lumbar interbody fusion with local morselized autograft bone and Zimmer DBM; insertion of interbody prosthesis at L2-3 and L3-4 (globus peek expandable interbody prosthesis); posterior segmental instrumentation from L2 to L5 with globus titanium pedicle screws and rods; posterior lateral arthrodesis at L2-3 and L3-4 with local morselized autograft bone and Zimmer DBM.  Discharge Exam: Blood pressure 122/80, pulse (!) 118, temperature 98.4 F (36.9 C), temperature source Oral, resp. rate 16, SpO2 99 %.  Alert and oriented x 4 PERRLA CN II-XII grossly intact MAE, Strength and sensation intact Incision is covered with Honeycomb dressing and Steri Strips; Dressing is clean, dry, and intact   Disposition: Discharge disposition: 01-Home or Self Care       Discharge Instructions     Call MD for:  persistant nausea and vomiting   Complete by: As directed    Call MD for:  redness, tenderness, or signs of infection (pain, swelling, redness, odor or green/yellow discharge around incision site)   Complete by: As directed    Call MD for:  severe uncontrolled pain   Complete by: As directed    Call MD for:  temperature >100.4   Complete by: As directed    Diet - low sodium heart healthy   Complete by: As directed    Increase activity slowly   Complete by: As directed    No wound care   Complete by: As directed    Remove  dressing in 24 hours   Complete by: As directed       Allergies as of 05/29/2022       Reactions   Sulfa Antibiotics Itching, Swelling        Medication List     STOP taking these medications     celecoxib 200 MG capsule Commonly known as: CELEBREX   methylPREDNISolone 4 MG Tbpk tablet Commonly known as: MEDROL DOSEPAK   oxyCODONE 5 MG immediate release tablet Commonly known as: Roxicodone       TAKE these medications    acetaminophen 500 MG tablet Commonly known as: TYLENOL Take 1,000 mg by mouth daily as needed for mild pain.   atorvastatin 40 MG tablet Commonly known as: LIPITOR Take 40 mg by mouth every evening.   CALCIUM PO Take 1 tablet by mouth daily.   cyclobenzaprine 5 MG tablet Commonly known as: FLEXERIL Take 1 tablet (5 mg total) by mouth 3 (three) times daily as needed for muscle spasms.   diclofenac Sodium 1 % Gel Commonly known as: VOLTAREN Apply 1 Application topically 2 (two) times daily as needed (pain).   docusate sodium 100 MG capsule Commonly known as: COLACE Take 1 capsule (100 mg total) by mouth 2 (two) times daily.   ferrous sulfate 325 (65 FE) MG tablet Take 1 tablet (325 mg total) by mouth 2 (two) times daily with a meal.   hydrochlorothiazide 25 MG tablet Commonly known as: HYDRODIURIL Take 25 mg by mouth daily.   latanoprost 0.005 % ophthalmic solution Commonly known as: XALATAN Place 1 drop into both eyes at bedtime.   multivitamin with minerals Tabs tablet Take 1 tablet by mouth daily.   omeprazole 20 MG tablet Commonly known as: PRILOSEC OTC Take 20 mg by mouth daily.   oxyCODONE-acetaminophen 5-325 MG tablet Commonly known as: Percocet Take 1-2 tablets by mouth every 4 (four) hours as needed for severe pain.   sertraline 100 MG tablet Commonly known as: ZOLOFT Take 200 mg by mouth daily.   valsartan 320 MG tablet Commonly known as: DIOVAN Take 320 mg by mouth daily.   VITAMIN C PO Take 1 tablet by mouth daily.   VITAMIN D-3 PO Take 1 capsule by mouth daily.   VITAMIN E PO Take 1 capsule by mouth daily.        Follow-up Information     Newman Pies, MD. Go on 06/23/2022.   Specialty:  Neurosurgery Why: First post op appointment with x-rays is on 06/23/2022 at 8:30 AM. Contact information: [place]. [address] [place] [address] [phone]                 Signed: Viona Gilmore, DNP, AGNP-C Nurse Practitioner  [facility] [place] [address], [address], [place], [place] [address] P: [phone]    F: [phone]  05/29/2022, 9:35 AM

## 2022-05-29 NOTE — Evaluation (Signed)
Occupational Therapy Evaluation Patient Details Name: Alyssa Valentine MRN: [id number] DOB: 19-Mar-1951 Today's Date: 05/29/2022   History of Present Illness Pt is a 71 y/o female who presents s/p L2-L4 PLIF on 05/28/2022. PMH significant for glaucoma, HTN, migraines, prior back surgery 2017, ORIF radial fx 2017, bilateral rotator cuff repair 2010 and 2013, bilateral carpal tunnel release 1990 and 2017.   Clinical Impression   Pt educated in back precautions during ADLs and AE use for LB ADLs, IADLs to avoid, pt verbalized and/or demonstrated understanding. She is mobilizing with supervision and RW. Equipment needs at home are met and she will have her supportive husband's assist as needed. No further OT needs.      Recommendations for follow up therapy are one component of a multi-disciplinary discharge planning process, led by the attending physician.  Recommendations may be updated based on patient status, additional functional criteria and insurance authorization.   Follow Up Recommendations  No OT follow up    Assistance Recommended at Discharge Set up Supervision/Assistance  Patient can return home with the following A little help with bathing/dressing/bathroom;Help with stairs or ramp for entrance;Assist for transportation    Functional Status Assessment  Patient has had a recent decline in their functional status and demonstrates the ability to make significant improvements in function in a reasonable and predictable amount of time.  Equipment Recommendations  None recommended by OT    Recommendations for Other Services       Precautions / Restrictions Precautions Precautions: Fall;Back Precaution Booklet Issued: Yes (comment) Precaution Comments: educated in back precautions related to ADLs and IADLs Required Braces or Orthoses: Spinal Brace Spinal Brace: Lumbar corset;Applied in sitting position Restrictions Weight Bearing Restrictions: No      Mobility Bed  Mobility Overal bed mobility: Modified Independent             General bed mobility comments: to return to side lying    Transfers Overall transfer level: Needs assistance Equipment used: Rolling walker (2 wheels) Transfers: Sit to/from Stand Sit to Stand: Supervision           General transfer comment: for safety, good technique      Balance Overall balance assessment: Needs assistance Sitting-balance support: Feet supported, No upper extremity supported Sitting balance-Leahy Scale: Good     Standing balance support: Bilateral upper extremity supported Standing balance-Leahy Scale: Poor                             ADL either performed or assessed with clinical judgement   ADL Overall ADL's : Needs assistance/impaired Eating/Feeding: Independent   Grooming: Supervision/safety;Sitting;Wash/dry hands Grooming Details (indicate cue type and reason): educated in 2 cup method for oral care Upper Body Bathing: Set up;Sitting   Lower Body Bathing: Minimal assistance;Sit to/from stand Lower Body Bathing Details (indicate cue type and reason): recommended long handled bath sponge Upper Body Dressing : Set up;Sitting   Lower Body Dressing: Minimal assistance;Sitting/lateral leans Lower Body Dressing Details (indicate cue type and reason): educated pt to use her reacher to don panties, does not wear socks, uses slip on shoes Toilet Transfer: Supervision/safety;Ambulation;Rolling walker (2 wheels)   Toileting- Clothing Manipulation and Hygiene: Supervision/safety;Sit to/from stand Toileting - Clothing Manipulation Details (indicate cue type and reason): recommended toilet tongs and to twisting with pericare     Functional mobility during ADLs: Supervision/safety;Rolling walker (2 wheels)       Vision Baseline Vision/History: 1 Wears glasses  Perception     Praxis      Pertinent Vitals/Pain Pain Assessment Pain Assessment: Faces Faces Pain  Scale: Hurts even more Pain Location: back Pain Descriptors / Indicators: Operative site guarding, Tender Pain Intervention(s): Monitored during session, Premedicated before session     Hand Dominance Right   Extremity/Trunk Assessment Upper Extremity Assessment Upper Extremity Assessment: Overall WFL for tasks assessed   Lower Extremity Assessment Lower Extremity Assessment: Defer to PT evaluation   Cervical / Trunk Assessment Cervical / Trunk Assessment: Back Surgery   Communication Communication Communication: No difficulties   Cognition Arousal/Alertness: Awake/alert Behavior During Therapy: WFL for tasks assessed/performed Overall Cognitive Status: Within Functional Limits for tasks assessed                                       General Comments       Exercises     Shoulder Instructions      Home Living Family/patient expects to be discharged to:: Private residence Living Arrangements: Spouse/significant other Available Help at Discharge: Family;Available 24 hours/day Type of Home: House Home Access: Stairs to enter [employer] of Steps: 5 Entrance Stairs-Rails: Right Home Layout: Two level;Able to live on main level with bedroom/bathroom Alternate Level Stairs-Number of Steps: flight   Bathroom Shower/Tub: Teacher, early years/pre: Standard     Home Equipment: Conservation officer, nature (2 wheels);Cane - single point;BSC/3in1;Shower seat;Grab bars - tub/shower;Hand held shower head;Adaptive equipment Adaptive Equipment: Reacher        Prior Functioning/Environment Prior Level of Function : Independent/Modified Independent                        OT Problem List: Impaired balance (sitting and/or standing)      OT Treatment/Interventions:      OT Goals(Current goals can be found in the care plan section)    OT Frequency:      Co-evaluation              AM-PAC OT "6 Clicks" Daily Activity     Outcome  Measure Help from another person eating meals?: None Help from another person taking care of personal grooming?: None Help from another person toileting, which includes using toliet, bedpan, or urinal?: None Help from another person bathing (including washing, rinsing, drying)?: A Little Help from another person to put on and taking off regular upper body clothing?: None Help from another person to put on and taking off regular lower body clothing?: A Little 6 Click Score: 22   End of Session Equipment Utilized During Treatment: Rolling walker (2 wheels);Back brace;Gait belt  Activity Tolerance: Patient tolerated treatment well Patient left: in bed;with call bell/phone within reach  OT Visit Diagnosis: Unsteadiness on feet (R26.81);Other abnormalities of gait and mobility (R26.89);Pain                Time: 2440-1027 OT Time Calculation (min): 13 min Charges:  OT General Charges $OT Visit: 1 Visit OT Evaluation $OT Eval Low Complexity: [place], OTR/L Acute Rehabilitation Services Office: [phone]   Alyssa Valentine 05/29/2022, 12:52 PM

## 2022-05-29 NOTE — Anesthesia Postprocedure Evaluation (Signed)
Anesthesia Post Note  Patient: Alyssa Valentine  Procedure(s) Performed: LUMBAR TWO-THREE, LUMBAR THREE-FOUR POSTERIOR LUMBAR INTERBODY FUSION WITH EXTENSION OF FUSION (Spine Lumbar)     Patient location during evaluation: PACU Anesthesia Type: General Level of consciousness: sedated and patient cooperative Pain management: pain level controlled Vital Signs Assessment: post-procedure vital signs reviewed and stable Respiratory status: spontaneous breathing Cardiovascular status: stable Anesthetic complications: no   No notable events documented.  Last Vitals:  Vitals:   05/28/22 2213 05/29/22 0444  BP: 109/72 135/82  Pulse: (!) 109 (!) 109  Resp: 16 18  Temp: 37.1 C 37.1 C  SpO2: 92% 98%    Last Pain:  Vitals:   05/29/22 0539  TempSrc:   PainSc: [place]

## 2022-05-29 NOTE — Plan of Care (Signed)

## 2022-05-29 NOTE — Progress Notes (Signed)
Subjective: The patient is alert and pleasant.  She looks well.  She denies headaches.  Objective: Vital signs in last 24 hours: Temp:  [97.4 F (36.3 C)-99.9 F (37.7 C)] 98.7 F (37.1 C) (07/14 0444) Pulse Rate:  [94-109] 109 (07/14 0444) Resp:  [15-20] 18 (07/14 0444) BP: (86-136)/(55-82) 135/82 (07/14 0444) SpO2:  [92 %-100 %] 98 % (07/14 0444) Estimated body mass index is 28.46 kg/m as calculated from the following:   Height as of 05/25/22: '5\' 1"'$  (1.549 m).   Weight as of 05/25/22: 68.3 kg.   Intake/Output from previous day: 07/13 0701 - 07/14 0700 In: 2200 [I.V.:1900; Blood:200; IV Piggyback:100] Out: 955 [Urine:405; Blood:550] Intake/Output this shift: No intake/output data recorded.  Physical exam the patient is alert and oriented.  Her lower extremity strength is normal.  Lab Results: No results for input(s): "WBC", "HGB", "HCT", "PLT" in the last 72 hours. BMET No results for input(s): "NA", "K", "CL", "CO2", "GLUCOSE", "BUN", "CREATININE", "CALCIUM" in the last 72 hours.  Studies/Results: DG Lumbar Spine 2-3 Views  Result Date: 05/28/2022 CLINICAL DATA:  Lumbar spine lesion extension L2-L3 EXAM: LUMBAR SPINE - 2 VIEW COMPARISON:  None Available. FINDINGS: Fluoroscopic images were obtained intraoperatively and submitted for post operative interpretation. Prior L4-L5 posterior fusion with extension to L2-L3, 3 images were obtained with 32 seconds of fluoroscopy time and 20.4 mGy. Please see the performing provider's procedural report for further detail. IMPRESSION: Fluoroscopic images of lumbar spine posterior fusion. Electronically Signed   By: Yetta Glassman M.D.   On: 05/28/2022 13:52   DG C-Arm 1-60 Min-No Report  Result Date: 05/28/2022 Fluoroscopy was utilized by the requesting physician.  No radiographic interpretation.   DG C-Arm 1-60 Min-No Report  Result Date: 05/28/2022 Fluoroscopy was utilized by the requesting physician.  No radiographic  interpretation.    Assessment/Plan: Postop day #1: The patient is doing well.  We will mobilize her with PT and OT.  She may go home later on today.  I gave her her discharge instructions and answered all her questions.  LOS: 1 day     [name] 05/29/2022, 6:44 AM     Patient ID: Alyssa Valentine, female   DOB: December 13, 1950, 71 y.o.   MRN: [id number]

## 2022-05-29 NOTE — Discharge Instructions (Signed)
Wound Care Keep incision covered and dry for two days.    Do not put any creams, lotions, or ointments on incision. Leave steri-strips on back.  They will fall off by themselves. You are fine to shower. Let water run over incision and pat dry.  Activity Walk each and every day, increasing distance each day. No lifting greater than 5 lbs.  Avoid excessive back motion. No driving for 2 weeks; may ride as a passenger locally.  Diet Resume your normal diet.   Return to Work Will be discussed at your follow up appointment.  Call Your Doctor If Any of These Occur Redness, drainage, or swelling at the wound.  Temperature greater than 101 degrees. Severe pain not relieved by pain medication. Incision starts to come apart.  Follow Up Appt Call [phone] today for appointment in 3-4 weeks if you don't already have one or for any problems.  If you have any hardware placed in your spine, you will need an x-ray before your appointment.

## 2022-05-29 NOTE — Evaluation (Signed)
Physical Therapy Evaluation  Patient Details Name: Alyssa Valentine MRN: [id number] DOB: 1950/12/07 Today's Date: 05/29/2022  History of Present Illness  Pt is a 71 y/o female who presents s/p L2-L4 PLIF on 05/28/2022. PMH significant for glaucoma, HTN, migraines, prior back surgery 2017, ORIF radial fx 2017, bilateral rotator cuff repair 2010 and 2013, bilateral carpal tunnel release 1990 and 2017.   Clinical Impression  Pt admitted with above diagnosis. At the time of PT eval, pt was able to demonstrate transfers and ambulation with gross supervision for safety and RW for support. Pt was educated on precautions, brace application/wearing schedule, appropriate activity progression, and car transfer. Brace adjusted for optimal fit. Pt currently with functional limitations due to the deficits listed below (see PT Problem List). Pt will benefit from skilled PT to increase their independence and safety with mobility to allow discharge to the venue listed below.         Recommendations for follow up therapy are one component of a multi-disciplinary discharge planning process, led by the attending physician.  Recommendations may be updated based on patient status, additional functional criteria and insurance authorization.  Follow Up Recommendations No PT follow up      Assistance Recommended at Discharge PRN  Patient can return home with the following  A little help with walking and/or transfers;A little help with bathing/dressing/bathroom;Assistance with cooking/housework;Assist for transportation;Help with stairs or ramp for entrance    Equipment Recommendations None recommended by PT  Recommendations for Other Services       Functional Status Assessment Patient has had a recent decline in their functional status and demonstrates the ability to make significant improvements in function in a reasonable and predictable amount of time.     Precautions / Restrictions Precautions Precautions:  Fall;Back Precaution Booklet Issued: Yes (comment) Precaution Comments: Reviewed handout and pt was cued for precautions during functional mobility. Required Braces or Orthoses: Spinal Brace Spinal Brace: Lumbar corset;Applied in sitting position Restrictions Weight Bearing Restrictions: No      Mobility  Bed Mobility Overal bed mobility: Modified Independent             General bed mobility comments: HOB slightly elevated and rails lowered to simulate home environment. No assist required.    Transfers Overall transfer level: Needs assistance Equipment used: Rolling walker (2 wheels) Transfers: Sit to/from Stand Sit to Stand: Supervision           General transfer comment: VC's for hand placement on seated surface for safety. Pt utilizing momentum to power up to full stand but no assist required.    Ambulation/Gait Ambulation/Gait assistance: Supervision Gait Distance (Feet): 200 Feet Assistive device: Rolling walker (2 wheels) Gait Pattern/deviations: Step-through pattern, Decreased stride length, Trunk flexed Gait velocity: Decreased Gait velocity interpretation: <1.8 ft/sec, indicate of risk for recurrent falls   General Gait Details: VC's for improved posture, closer walker proximity, and forward gaze. Slow and guarded but generally steady.  Stairs Stairs: Yes Stairs assistance: Min guard Stair Management: One rail Right, Step to pattern, Forwards Number of Stairs: 5 General stair comments: VC's for sequencing and general safety. Pt able to negotiate stairs slowly and intentially, requiring increased time to lower and hands on guarding for safety. Noted soft knees upon descent but no LOB.  Wheelchair Mobility    Modified Rankin (Stroke Patients Only)       Balance Overall balance assessment: Needs assistance Sitting-balance support: Feet supported, No upper extremity supported Sitting balance-Leahy Scale: Fair  Standing balance support: No upper  extremity supported Standing balance-Leahy Scale: Poor Standing balance comment: Reliant on UE support                             Pertinent Vitals/Pain Pain Assessment Pain Assessment: 0-10 Pain Score: 6  Pain Location: back Pain Descriptors / Indicators: Operative site guarding, Tender Pain Intervention(s): Limited activity within patient's tolerance, Monitored during session, Repositioned    Home Living Family/patient expects to be discharged to:: Private residence Living Arrangements: Spouse/significant other Available Help at Discharge: Family;Available 24 hours/day Type of Home: House Home Access: Stairs to enter Entrance Stairs-Rails: Right Entrance Stairs-Number of Steps: 5 Alternate Level Stairs-Number of Steps: flight Home Layout: Two level;Able to live on main level with bedroom/bathroom Home Equipment: Rolling Walker (2 wheels);Cane - single point;BSC/3in1;Shower seat      Prior Function Prior Level of Function : Independent/Modified Independent                     Hand Dominance        Extremity/Trunk Assessment   Upper Extremity Assessment Upper Extremity Assessment: Overall WFL for tasks assessed    Lower Extremity Assessment Lower Extremity Assessment: Generalized weakness (Mild; consistent with pre-op diagnosis)    Cervical / Trunk Assessment Cervical / Trunk Assessment: Back Surgery  Communication   Communication: No difficulties  Cognition Arousal/Alertness: Awake/alert Behavior During Therapy: WFL for tasks assessed/performed Overall Cognitive Status: Within Functional Limits for tasks assessed                                          General Comments      Exercises     Assessment/Plan    PT Assessment Patient needs continued PT services  PT Problem List Decreased strength;Decreased activity tolerance;Decreased balance;Decreased mobility;Decreased knowledge of use of DME;Decreased safety  awareness;Decreased knowledge of precautions;Pain       PT Treatment Interventions DME instruction;Gait training;Stair training;Functional mobility training;Therapeutic activities;Therapeutic exercise;Balance training;Patient/family education    PT Goals (Current goals can be found in the Care Plan section)  Acute Rehab PT Goals Patient Stated Goal: Home today PT Goal Formulation: With patient Time For Goal Achievement: 06/05/22 Potential to Achieve Goals: Good    Frequency Min 5X/week     Co-evaluation               AM-PAC PT "6 Clicks" Mobility  Outcome Measure Help needed turning from your back to your side while in a flat bed without using bedrails?: None Help needed moving from lying on your back to sitting on the side of a flat bed without using bedrails?: None Help needed moving to and from a bed to a chair (including a wheelchair)?: A Little Help needed standing up from a chair using your arms (e.g., wheelchair or bedside chair)?: A Little Help needed to walk in hospital room?: A Little Help needed climbing 3-5 steps with a railing? : A Little 6 Click Score: 20    End of Session Equipment Utilized During Treatment: Gait belt;Back brace Activity Tolerance: Patient tolerated treatment well Patient left: in bed;with call bell/phone within reach (Sitting EOB awaiting OT) Nurse Communication: Mobility status PT Visit Diagnosis: Unsteadiness on feet (R26.81);Pain Pain - part of body:  (Back)    Time: 9735-3299 PT Time Calculation (min) (ACUTE ONLY): 23 min   Charges:  PT Evaluation $PT Eval Low Complexity: 1 Low PT Treatments $Gait Training: 8-22 mins        Rolinda Roan, PT, DPT Acute Rehabilitation Services Secure Chat Preferred Office: [phone]   Thelma Comp 05/29/2022, 11:35 AM

## 2022-05-29 NOTE — Progress Notes (Signed)
   Providing Compassionate, Quality Care - Together    Vital signs in last 24 hours: Temp:  [97.4 F (36.3 C)-99.9 F (37.7 C)] 98.4 F (36.9 C) (07/14 0813) Pulse Rate:  [94-122] 118 (07/14 0822) Resp:  [15-20] 16 (07/14 0813) BP: (86-136)/(55-82) 122/80 (07/14 0813) SpO2:  [92 %-100 %] 99 % (07/14 0822)  Intake/Output from previous day: 07/13 0701 - 07/14 0700 In: 2200 [I.V.:1900; Blood:200; IV Piggyback:100] Out: 955 [Urine:405; Blood:550] Intake/Output this shift: No intake/output data recorded.   Lab Results: Recent Labs    05/29/22 1049  WBC 5.7  HGB 7.8*  HCT 23.4*  PLT 74*   BMET Recent Labs    05/29/22 1049  NA 139  K 3.3*  CL 110  CO2 22  GLUCOSE 106*  BUN 15  CREATININE 1.33*  CALCIUM 7.7*    Studies/Results: DG Lumbar Spine 2-3 Views  Result Date: 05/28/2022 CLINICAL DATA:  Lumbar spine lesion extension L2-L3 EXAM: LUMBAR SPINE - 2 VIEW COMPARISON:  None Available. FINDINGS: Fluoroscopic images were obtained intraoperatively and submitted for post operative interpretation. Prior L4-L5 posterior fusion with extension to L2-L3, 3 images were obtained with 32 seconds of fluoroscopy time and 20.4 mGy. Please see the performing provider's procedural report for further detail. IMPRESSION: Fluoroscopic images of lumbar spine posterior fusion. Electronically Signed   By: Yetta Glassman M.D.   On: 05/28/2022 13:52   DG C-Arm 1-60 Min-No Report  Result Date: 05/28/2022 Fluoroscopy was utilized by the requesting physician.  No radiographic interpretation.   DG C-Arm 1-60 Min-No Report  Result Date: 05/28/2022 Fluoroscopy was utilized by the requesting physician.  No radiographic interpretation.    Assessment/Plan: Patient with tachycardia and hemoglobin that has dropped from 11.8 to 7.8. Will delay discharge by 1 day and add iron supplement. Recheck CBC in the morning.   LOS: 1 day     Viona Gilmore, DNP, AGNP-C Nurse Practitioner  [facility]  [facility] [address], [address], [place], [place] [address] P: [phone]    F: [phone]  05/29/2022, 12:15 PM

## 2022-05-30 LAB — CBC
HCT: 21.2 % — ABNORMAL LOW (ref 36.0–46.0)
Hemoglobin: 7 g/dL — ABNORMAL LOW (ref 12.0–15.0)
MCH: 28.1 pg (ref 26.0–34.0)
MCHC: 33 g/dL (ref 30.0–36.0)
MCV: 85.1 fL (ref 80.0–100.0)
Platelets: 69 K/uL — ABNORMAL LOW (ref 150–400)
RBC: 2.49 MIL/uL — ABNORMAL LOW (ref 3.87–5.11)
RDW: 16.7 % — ABNORMAL HIGH (ref 11.5–15.5)
WBC: 5.1 K/uL (ref 4.0–10.5)
nRBC: 0 % (ref 0.0–0.2)

## 2022-05-30 NOTE — Progress Notes (Signed)
Physical Therapy Treatment & Discharge  Patient Details Name: Alyssa Valentine MRN: [id number] DOB: 04/01/1951 Today's Date: 05/30/2022   History of Present Illness Pt is a 71 y/o female who presents s/p L2-L4 PLIF on 05/28/2022. PMH significant for glaucoma, HTN, migraines, prior back surgery 2017, ORIF radial fx 2017, bilateral rotator cuff repair 2010 and 2013, bilateral carpal tunnel release 1990 and 2017.    PT Comments    Pt with good recall of spinal precautions and log roll technique for bed mobility. ModI for bed mobility and transfers and ambulated 241f with RW and supervision. PT reviewed safe car transfers and progression of mobility upon discharge. Pt with no further questions/concerns for safe discharge, PT will sign off.    Recommendations for follow up therapy are one component of a multi-disciplinary discharge planning process, led by the attending physician.  Recommendations may be updated based on patient status, additional functional criteria and insurance authorization.  Follow Up Recommendations  No PT follow up     Assistance Recommended at Discharge PRN  Patient can return home with the following A little help with walking and/or transfers;A little help with bathing/dressing/bathroom;Assistance with cooking/housework;Assist for transportation;Help with stairs or ramp for entrance   Equipment Recommendations  None recommended by PT    Recommendations for Other Services       Precautions / Restrictions Precautions Precautions: Fall;Back Precaution Booklet Issued: Yes (comment) Precaution Comments: pt able to recall precautions Required Braces or Orthoses: Spinal Brace Spinal Brace: Lumbar corset;Applied in sitting position Restrictions Weight Bearing Restrictions: No     Mobility  Bed Mobility Overal bed mobility: Modified Independent             General bed mobility comments: able to perform log roll    Transfers Overall transfer level:  Modified independent Equipment used: Rolling walker (2 wheels) Transfers: Sit to/from Stand Sit to Stand: Modified independent (Device/Increase time)                Ambulation/Gait Ambulation/Gait assistance: Supervision Gait Distance (Feet): 200 Feet Assistive device: Rolling walker (2 wheels) Gait Pattern/deviations: Step-through pattern, Decreased stride length Gait velocity: Decreased Gait velocity interpretation: 1.31 - 2.62 ft/sec, indicative of limited community ambulator   General Gait Details: demonstrated upright posture and slow step-through pattern   Stairs             Wheelchair Mobility    Modified Rankin (Stroke Patients Only)       Balance Overall balance assessment: Modified Independent                                          Cognition Arousal/Alertness: Awake/alert Behavior During Therapy: WFL for tasks assessed/performed Overall Cognitive Status: Within Functional Limits for tasks assessed                                          Exercises      General Comments        Pertinent Vitals/Pain Pain Assessment Pain Assessment: No/denies pain    Home Living                          Prior Function            PT Goals (current goals can  now be found in the care plan section) Acute Rehab PT Goals Patient Stated Goal: Home today PT Goal Formulation: All assessment and education complete, DC therapy Progress towards PT goals: Goals met/education completed, patient discharged from PT    Frequency    Min 5X/week      PT Plan Current plan remains appropriate    Co-evaluation              AM-PAC PT "6 Clicks" Mobility   Outcome Measure  Help needed turning from your back to your side while in a flat bed without using bedrails?: None Help needed moving from lying on your back to sitting on the side of a flat bed without using bedrails?: None Help needed moving to and from a  bed to a chair (including a wheelchair)?: None Help needed standing up from a chair using your arms (e.g., wheelchair or bedside chair)?: None Help needed to walk in hospital room?: A Little Help needed climbing 3-5 steps with a railing? : A Little 6 Click Score: 22    End of Session Equipment Utilized During Treatment: Gait belt;Back brace Activity Tolerance: Patient tolerated treatment well Patient left: in bed;with call bell/phone within reach Nurse Communication: Mobility status PT Visit Diagnosis: Unsteadiness on feet (R26.81);Pain Pain - part of body:  (back)     Time: 1438-8875 PT Time Calculation (min) (ACUTE ONLY): 11 min  Charges:  $Therapeutic Activity: 8-22 mins                     Mackie Pai, SPT Acute Rehabilitation Services  Office: [phone]    Mackie Pai 05/30/2022, 9:33 AM

## 2022-05-30 NOTE — Progress Notes (Signed)
Patient is discharged from room 3C08 at this time. Alert and in stable condition. IV site d/c'd and instructions read to patient with understanding verbalized and all questions answered. Left unit via wheelchair with all belongings at side. 

## 2022-05-30 NOTE — Discharge Summary (Signed)
Physician Discharge Summary  Patient ID: Alyssa Valentine MRN: [id number] DOB/AGE: 72-Nov-1952 72 y.o.  Admit date: 05/28/2022 Discharge date: 05/30/2022  Admission Diagnoses: Lumbar radiculopathy   Discharge Diagnoses: Same, postoperative blood loss anemia   Discharged Condition: good  Hospital Course: The patient was admitted on 05/28/2022 and taken to the operating room where the patient underwent lumbar decompression and fusion. The patient tolerated the procedure well and was taken to the recovery room and then to the floor in stable condition. The hospital course was routine. There were no complications. The wound remained clean dry and intact. Pt had appropriate back soreness. No complaints of leg pain or new N/T/W. The patient remained afebrile with stable vital signs, and tolerated a regular diet. The patient continued to increase activities, and pain was well controlled with oral pain medications.  Her postoperative hemoglobin was 7.8 and seven-point.  She was started on iron by her primary provider.  She is asymptomatic.  Consults: None  Significant Diagnostic Studies:  Results for orders placed or performed during the hospital encounter of 05/28/22  CBC  Result Value Ref Range   WBC 5.7 4.0 - 10.5 K/uL   RBC 2.74 (L) 3.87 - 5.11 MIL/uL   Hemoglobin 7.8 (L) 12.0 - 15.0 g/dL   HCT 23.4 (L) 36.0 - 46.0 %   MCV 85.4 80.0 - 100.0 fL   MCH 28.5 26.0 - 34.0 pg   MCHC 33.3 30.0 - 36.0 g/dL   RDW 16.3 (H) 11.5 - 15.5 %   Platelets 74 (L) 150 - 400 K/uL   nRBC 0.0 0.0 - 0.2 %  Basic metabolic panel  Result Value Ref Range   Sodium 139 135 - 145 mmol/L   Potassium 3.3 (L) 3.5 - 5.1 mmol/L   Chloride 110 98 - 111 mmol/L   CO2 22 22 - 32 mmol/L   Glucose, Bld 106 (H) 70 - 99 mg/dL   BUN 15 8 - 23 mg/dL   Creatinine, Ser 1.33 (H) 0.44 - 1.00 mg/dL   Calcium 7.7 (L) 8.9 - 10.3 mg/dL   GFR, Estimated 43 (L) >60 mL/min   Anion gap 7 5 - 15  CBC  Result Value Ref Range   WBC 5.1  4.0 - 10.5 K/uL   RBC 2.49 (L) 3.87 - 5.11 MIL/uL   Hemoglobin 7.0 (L) 12.0 - 15.0 g/dL   HCT 21.2 (L) 36.0 - 46.0 %   MCV 85.1 80.0 - 100.0 fL   MCH 28.1 26.0 - 34.0 pg   MCHC 33.0 30.0 - 36.0 g/dL   RDW 16.7 (H) 11.5 - 15.5 %   Platelets 69 (L) 150 - 400 K/uL   nRBC 0.0 0.0 - 0.2 %    DG Lumbar Spine 2-3 Views  Result Date: 05/28/2022 CLINICAL DATA:  Lumbar spine lesion extension L2-L3 EXAM: LUMBAR SPINE - 2 VIEW COMPARISON:  None Available. FINDINGS: Fluoroscopic images were obtained intraoperatively and submitted for post operative interpretation. Prior L4-L5 posterior fusion with extension to L2-L3, 3 images were obtained with 32 seconds of fluoroscopy time and 20.4 mGy. Please see the performing provider's procedural report for further detail. IMPRESSION: Fluoroscopic images of lumbar spine posterior fusion. Electronically Signed   By: Yetta Glassman M.D.   On: 05/28/2022 13:52   DG C-Arm 1-60 Min-No Report  Result Date: 05/28/2022 Fluoroscopy was utilized by the requesting physician.  No radiographic interpretation.   DG C-Arm 1-60 Min-No Report  Result Date: 05/28/2022 Fluoroscopy was utilized by the requesting physician.  No radiographic interpretation.    Antibiotics:  Anti-infectives (From admission, onward)    Start     Dose/Rate Route Frequency Ordered Stop   05/28/22 1730  ceFAZolin (ANCEF) IVPB 2g/100 mL premix        2 g 200 mL/hr over 30 Minutes Intravenous Every 8 hours 05/28/22 1640 05/29/22 0137   05/28/22 0645  ceFAZolin (ANCEF) IVPB 2g/100 mL premix        2 g 200 mL/hr over 30 Minutes Intravenous On call to O.R. 05/28/22 2633 05/28/22 0810       Discharge Exam: Blood pressure 105/66, pulse 99, temperature 99.1 F (37.3 C), temperature source Oral, resp. rate 18, SpO2 99 %. Neurologic: Grossly normal Dressing dry  Discharge Medications:   Allergies as of 05/30/2022       Reactions   Sulfa Antibiotics Itching, Swelling        Medication List      STOP taking these medications    celecoxib 200 MG capsule Commonly known as: CELEBREX   methylPREDNISolone 4 MG Tbpk tablet Commonly known as: MEDROL DOSEPAK   oxyCODONE 5 MG immediate release tablet Commonly known as: Roxicodone       TAKE these medications    acetaminophen 500 MG tablet Commonly known as: TYLENOL Take 1,000 mg by mouth daily as needed for mild pain.   atorvastatin 40 MG tablet Commonly known as: LIPITOR Take 40 mg by mouth every evening.   CALCIUM PO Take 1 tablet by mouth daily.   cyclobenzaprine 5 MG tablet Commonly known as: FLEXERIL Take 1 tablet (5 mg total) by mouth 3 (three) times daily as needed for muscle spasms.   diclofenac Sodium 1 % Gel Commonly known as: VOLTAREN Apply 1 Application topically 2 (two) times daily as needed (pain).   docusate sodium 100 MG capsule Commonly known as: COLACE Take 1 capsule (100 mg total) by mouth 2 (two) times daily.   ferrous sulfate 325 (65 FE) MG tablet Take 1 tablet (325 mg total) by mouth 2 (two) times daily with a meal.   hydrochlorothiazide 25 MG tablet Commonly known as: HYDRODIURIL Take 25 mg by mouth daily.   latanoprost 0.005 % ophthalmic solution Commonly known as: XALATAN Place 1 drop into both eyes at bedtime.   multivitamin with minerals Tabs tablet Take 1 tablet by mouth daily.   omeprazole 20 MG tablet Commonly known as: PRILOSEC OTC Take 20 mg by mouth daily.   oxyCODONE-acetaminophen 5-325 MG tablet Commonly known as: Percocet Take 1-2 tablets by mouth every 4 (four) hours as needed for severe pain.   sertraline 100 MG tablet Commonly known as: ZOLOFT Take 200 mg by mouth daily.   valsartan 320 MG tablet Commonly known as: DIOVAN Take 320 mg by mouth daily.   VITAMIN C PO Take 1 tablet by mouth daily.   VITAMIN D-3 PO Take 1 capsule by mouth daily.   VITAMIN E PO Take 1 capsule by mouth daily.        Disposition: Home   Final Dx: Lumbar  decompression and instrumented fusion  Discharge Instructions     Call MD for:  persistant nausea and vomiting   Complete by: As directed    Call MD for:  redness, tenderness, or signs of infection (pain, swelling, redness, odor or green/yellow discharge around incision site)   Complete by: As directed    Call MD for:  severe uncontrolled pain   Complete by: As directed    Call MD for:  temperature >  100.4   Complete by: As directed    Diet - low sodium heart healthy   Complete by: As directed    Increase activity slowly   Complete by: As directed    No wound care   Complete by: As directed    Remove dressing in 24 hours   Complete by: As directed         Follow-up Information     Newman Pies, MD. Go on 06/23/2022.   Specialty: Neurosurgery Why: First post op appointment with x-rays is on 06/23/2022 at 8:30 AM. Contact information: [place]. [address] [place] [address] [phone]                  Signed: Eustace Moore 05/30/2022, 7:27 AM

## 2022-06-03 ENCOUNTER — Other Ambulatory Visit (HOSPITAL_BASED_OUTPATIENT_CLINIC_OR_DEPARTMENT_OTHER): Payer: Self-pay | Admitting: Student

## 2022-06-03 ENCOUNTER — Ambulatory Visit (HOSPITAL_BASED_OUTPATIENT_CLINIC_OR_DEPARTMENT_OTHER)
Admission: RE | Admit: 2022-06-03 | Discharge: 2022-06-03 | Disposition: A | Discharge status: Home / Self Care | Payer: Federal, State, Local not specified - PPO | Source: Ambulatory Visit | Attending: Student | Admitting: Student

## 2022-06-03 ENCOUNTER — Ambulatory Visit (HOSPITAL_BASED_OUTPATIENT_CLINIC_OR_DEPARTMENT_OTHER): Payer: Federal, State, Local not specified - PPO

## 2022-06-03 DIAGNOSIS — R600 Localized edema: Secondary | ICD-10-CM

## 2022-06-19 ENCOUNTER — Encounter (HOSPITAL_COMMUNITY): Payer: Self-pay | Admitting: Neurosurgery

## 2022-09-18 ENCOUNTER — Ambulatory Visit
Admission: EM | Admit: 2022-09-18 | Discharge: 2022-09-18 | Disposition: A | Discharge status: Home / Self Care | Payer: Federal, State, Local not specified - PPO | Attending: Internal Medicine | Admitting: Internal Medicine

## 2022-09-18 DIAGNOSIS — R030 Elevated blood-pressure reading, without diagnosis of hypertension: Secondary | ICD-10-CM

## 2022-09-18 DIAGNOSIS — S0502XA Injury of conjunctiva and corneal abrasion without foreign body, left eye, initial encounter: Secondary | ICD-10-CM

## 2022-09-18 MED ORDER — OFLOXACIN 0.3 % OP SOLN: 1.0000 [drp] | Freq: Four times a day (QID) | OPHTHALMIC | 0 refills | Status: AC

## 2022-09-18 NOTE — ED Triage Notes (Signed)
Pt presents to [facility] with co of L eye swelling, drainage and pain since the morning, has been using eye drops for pain.

## 2022-09-18 NOTE — ED Provider Notes (Signed)
[name]    CSN: [id number] Arrival date & time: 09/18/22  1721      History   Chief Complaint Chief Complaint  Patient presents with   Eye Pain    HPI Alyssa Valentine is a 71 y.o. female.   Patient presents with left eye swelling, drainage, pain that started this morning upon awakening.  Denies trauma or foreign body to the eye.  She does report some clear drainage from the eye.  Reports associated runny nose as well.  Patient reports history of glaucoma.  Does not wear contacts but does wear glasses.  Denies any changes in the vision.  Also has elevated blood pressure reading but reports that she has been taking her medication as prescribed.  Denies chest pain, shortness of breath, headache, dizziness, blurred vision, nausea, vomiting.   Eye Pain    Past Medical History:  Diagnosis Date   Anemia    many years ago   Anxiety    Arthritis    Depression    GERD (gastroesophageal reflux disease)    Glaucoma    Headache    hx of migraines, no longer have them   HTN (hypertension)     Patient Active Problem List   Diagnosis Date Noted   Degenerative scoliosis in adult patient 05/28/2022   Spondylolisthesis of lumbar region 08/26/2016   Radius distal fracture 01/04/2016   Tinea unguium 02/23/2014   Insomnia 02/23/2014   Other and unspecified hyperlipidemia 02/23/2014   HTN (hypertension)     Past Surgical History:  Procedure Laterality Date   BACK SURGERY  2017   CARPAL TUNNEL RELEASE Right 07/17/1989   CARPAL TUNNEL RELEASE Left 01/04/2016   Procedure: CARPAL TUNNEL RELEASE;  Surgeon: Roseanne Kaufman, MD;  Location: [place];  Service: Orthopedics;  Laterality: Left;   CESAREAN SECTION  11/17/1983   COLONOSCOPY N/A 09/15/2013   Procedure: COLONOSCOPY;  Surgeon: Beryle Beams, MD;  Location: [facility];  Service: Endoscopy;  Laterality: N/A;   ESOPHAGOGASTRODUODENOSCOPY N/A 09/15/2013   Procedure: ESOPHAGOGASTRODUODENOSCOPY (EGD);  Surgeon:  Beryle Beams, MD;  Location: [facility];  Service: Endoscopy;  Laterality: N/A;   ORIF RADIAL FRACTURE Left 01/04/2016   Procedure: OPEN REDUCTION INTERNAL FIXATION (ORIF) LEFT RADIAL FRACTURE;  Surgeon: Roseanne Kaufman, MD;  Location: [place];  Service: Orthopedics;  Laterality: Left;   ROTATOR CUFF REPAIR Bilateral 2010 and 2013    OB History     Gravida  8   Para      Term      Preterm      AB  4   Living         SAB      IAB      Ectopic      Multiple      Live Births               Home Medications    Prior to Admission medications   Medication Sig Start Date End Date Taking? Authorizing Provider  ofloxacin (OCUFLOX) 0.3 % ophthalmic solution Place 1 drop into the left eye 4 (four) times daily. 09/18/22  Yes Cashlyn Huguley, Hildred Alamin E, FNP  acetaminophen (TYLENOL) 500 MG tablet Take 1,000 mg by mouth daily as needed for mild pain.    [provider]  Ascorbic Acid (VITAMIN C PO) Take 1 tablet by mouth daily.    [provider]  atorvastatin (LIPITOR) 40 MG tablet Take 40 mg by mouth every evening.    [provider]  CALCIUM PO Take 1 tablet by mouth daily.    [provider]  Cholecalciferol (VITAMIN D-3 PO) Take 1 capsule by mouth daily.    [provider]  cyclobenzaprine (FLEXERIL) 5 MG tablet Take 1 tablet (5 mg total) by mouth 3 (three) times daily as needed for muscle spasms. 05/29/22   [name], NP  diclofenac Sodium (VOLTAREN) 1 % GEL Apply 1 Application topically 2 (two) times daily as needed (pain).    [provider]  docusate sodium (COLACE) 100 MG capsule Take 1 capsule (100 mg total) by mouth 2 (two) times daily. 05/29/22   [name], NP  ferrous sulfate 325 (65 FE) MG tablet Take 1 tablet (325 mg total) by mouth 2 (two) times daily with a meal. 05/29/22   [name], NP  hydrochlorothiazide (HYDRODIURIL) 25 MG tablet Take 25 mg by mouth daily.    [provider]   latanoprost (XALATAN) 0.005 % ophthalmic solution Place 1 drop into both eyes at bedtime. 12/09/15   [provider]  Multiple Vitamin (MULTIVITAMIN WITH MINERALS) TABS tablet Take 1 tablet by mouth daily.    [provider]  omeprazole (PRILOSEC OTC) 20 MG tablet Take 20 mg by mouth daily.    [provider]  oxyCODONE-acetaminophen (PERCOCET) 5-325 MG tablet Take 1-2 tablets by mouth every 4 (four) hours as needed for severe pain. 05/29/22 05/29/23  [name], NP  sertraline (ZOLOFT) 100 MG tablet Take 200 mg by mouth daily. 12/14/15   [provider]  valsartan (DIOVAN) 320 MG tablet Take 320 mg by mouth daily.    [provider]  VITAMIN E PO Take 1 capsule by mouth daily.    [provider]    Family History History reviewed. No pertinent family history.  Social History Social History   Tobacco Use   Smoking status: Never   Smokeless tobacco: Never  Vaping Use   Vaping Use: Never used  Substance Use Topics   Alcohol use: Yes    Comment: occasional   Drug use: No     Allergies   Sulfa antibiotics   Review of Systems Review of Systems Per HPI  Physical Exam Triage Vital Signs ED Triage Vitals  Enc Vitals Group     BP 09/18/22 1742 (!) 179/118     Pulse Rate 09/18/22 1742 89     Resp 09/18/22 1742 19     Temp 09/18/22 1742 98.3 F (36.8 C)     Temp Source 09/18/22 1742 Oral     SpO2 09/18/22 1742 98 %     Weight --      Height --      Head Circumference --      Peak Flow --      Pain Score 09/18/22 1741 0     Pain Loc --      Pain Edu? --      Excl. in [place]? --    No data found.  Updated Vital Signs BP (!) 179/129   Pulse 89   Temp 98.3 F (36.8 C) (Oral)   Resp 19   SpO2 98%   Visual Acuity Right Eye Distance:   Left Eye Distance:   Bilateral Distance:    Right Eye Near:   Left Eye Near:    Bilateral Near:     Physical Exam Constitutional:      General: She is not in acute  distress.    Appearance: Normal appearance.  She is not toxic-appearing or diaphoretic.  HENT:     Head: Normocephalic and atraumatic.  Eyes:     General: Lids are everted, no foreign bodies appreciated. Vision grossly intact. Gaze aligned appropriately.     Extraocular Movements: Extraocular movements intact.     Conjunctiva/sclera: Conjunctivae normal.     Pupils: Pupils are equal, round, and reactive to light.     Left eye: Corneal abrasion and fluorescein uptake present.      Comments: Patient has corneal abrasion noted by fluorescein reuptake present to left eye.  Also has mild left upper eyelid swelling.  Cardiovascular:     Rate and Rhythm: Normal rate and regular rhythm.     Pulses: Normal pulses.     Heart sounds: Normal heart sounds.  Pulmonary:     Effort: Pulmonary effort is normal.     Breath sounds: Normal breath sounds.  Neurological:     General: No focal deficit present.     Mental Status: She is alert and oriented to person, place, and time. Mental status is at baseline.     Cranial Nerves: Cranial nerves 2-12 are intact.     Sensory: Sensation is intact.     Motor: Motor function is intact.     Coordination: Coordination is intact.     Gait: Gait is intact.  Psychiatric:        Mood and Affect: Mood normal.        Behavior: Behavior normal.        Thought Content: Thought content normal.        Judgment: Judgment normal.      UC Treatments / Results  Labs (all labs ordered are listed, but only abnormal results are displayed) Labs Reviewed - No data to display  EKG   Radiology No results found.  Procedures Procedures (including critical care time)  Medications Ordered in UC Medications - No data to display  Initial Impression / Assessment and Plan / UC Course  I have reviewed the triage vital signs and the nursing notes.  Pertinent labs & imaging results that were available during my care of the patient were reviewed by me and considered in my  medical decision making (see chart for details).     Suspected corneal abrasion to left eye but was unsure given appearance on physical exam with fluorescein stain.  Therefore, consulted Dr. Katy Fitch with on-call ophthalmology.  He agreed that it appears to be corneal abrasion.  He recommended ofloxacin 4 times daily and following up with him at 8 AM on Monday for further evaluation and management.  Provided patient with contact information and address for eye doctor office.  Visual acuity appears normal.  She also has elevated blood pressure reading but suspect this is due to pain and discomfort given the patient is reporting significant eye pain.  Patient reports that when she takes her blood pressure at home it is typically normal.  She has been taking her blood pressure medication as prescribed.  Physical exam is normal as well as neuro exam.  No signs of hypertensive urgency or endorgan damage on exam.  Advised patient to monitor blood pressure at home and follow-up if it remains elevated. Advised following up with urgent care if unable to get in with PCP.  Also advised strict ER precautions regarding blood pressure.  Patient verbalized understanding and was agreeable with plan. Final Clinical Impressions(s) / UC Diagnoses   Final diagnoses:  Abrasion of left cornea, initial encounter  Elevated blood  pressure reading     Discharge Instructions      You have a scratch or abrasion in your eye.  This is being treated with antibiotic eyedrop.  Please follow-up with eye doctor on Monday for further evaluation and management.    ED Prescriptions     Medication Sig Dispense Auth. Provider   ofloxacin (OCUFLOX) 0.3 % ophthalmic solution Place 1 drop into the left eye 4 (four) times daily. 5 mL [name], [place]      PDMP not reviewed this encounter.   [name],  09/18/22 1904

## 2022-09-18 NOTE — Discharge Instructions (Signed)
You have a scratch or abrasion in your eye.  This is being treated with antibiotic eyedrop.  Please follow-up with eye doctor on Monday for further evaluation and management.

## 2022-11-12 ENCOUNTER — Telehealth: Payer: Self-pay | Admitting: *Deleted

## 2022-11-12 NOTE — Telephone Encounter (Signed)
1st attempt to reach pt regarding surgical clearance request and the need for an in-office appointment. Left message to call back and get that scheduled.

## 2022-11-12 NOTE — Telephone Encounter (Signed)
   Pre-operative Risk Assessment    Patient Name: Alyssa Valentine  DOB: 1951-07-11 MRN: [id number]      Request for Surgical Clearance    Procedure:   LEFT REVERSE TOTAL SHOULDER ARTHROPLASTY  Date of Surgery:  Clearance TBD                                 Surgeon:  DR. Esmond Plants Surgeon's Group or Practice Name:  [facility] Phone number:  [phone] Fax number:  [phone]   Type of Clearance Requested:   - Medical    Type of Anesthesia:   CHOICE   Additional requests/questions:    Astrid Divine   11/12/2022, 7:26 AM

## 2022-11-12 NOTE — Telephone Encounter (Signed)
   Name: Alyssa Valentine  DOB: 04-Oct-1951  MRN: [id number]  Primary Cardiologist: Buford Dresser, MD  Chart reviewed as part of pre-operative protocol coverage. Because of Alyssa Valentine's past medical history and time since last visit, she will require a follow-up in-office visit in order to better assess preoperative cardiovascular risk.  Pre-op covering staff: - Please schedule appointment and call patient to inform them. If patient already had an upcoming appointment within acceptable timeframe, please add "pre-op clearance" to the appointment notes so provider is aware. - Please contact requesting surgeon's office via preferred method (i.e, phone, fax) to inform them of need for appointment prior to surgery.   [name], NP  11/12/2022, 10:25 AM

## 2022-11-13 NOTE — Telephone Encounter (Signed)
Pt ha appt 11/20/22 with Jory Sims, DNP for pre op clearance. I will update all parties involved in this matter.

## 2022-11-18 ENCOUNTER — Ambulatory Visit
Admission: EM | Admit: 2022-11-18 | Discharge: 2022-11-18 | Disposition: A | Discharge status: Home / Self Care | Payer: Federal, State, Local not specified - PPO | Attending: Internal Medicine | Admitting: Internal Medicine

## 2022-11-18 DIAGNOSIS — R109 Unspecified abdominal pain: Secondary | ICD-10-CM | POA: Insufficient documentation

## 2022-11-18 LAB — POCT URINALYSIS DIP (MANUAL ENTRY)
Bilirubin, UA: NEGATIVE
Blood, UA: NEGATIVE
Glucose, UA: NEGATIVE mg/dL
Ketones, POC UA: NEGATIVE mg/dL
Nitrite, UA: NEGATIVE
Protein Ur, POC: NEGATIVE mg/dL
Spec Grav, UA: 1.02 (ref 1.010–1.025)
Urobilinogen, UA: 0.2 E.U./dL
pH, UA: 5.5 (ref 5.0–8.0)

## 2022-11-18 MED ORDER — CEPHALEXIN 250 MG PO CAPS
250.0000 mg | ORAL_CAPSULE | Freq: Four times a day (QID) | ORAL | 0 refills | Status: AC
Start: 2022-11-18 — End: 2022-11-23

## 2022-11-18 MED ORDER — METHOCARBAMOL 500 MG PO TABS: 500.0000 mg | ORAL_TABLET | Freq: Two times a day (BID) | ORAL | 0 refills | Status: DC | PRN

## 2022-11-18 NOTE — ED Provider Notes (Signed)
[name]    CSN: [id number] Arrival date & time: 11/18/22  1627      History   Chief Complaint Chief Complaint  Patient presents with   right flank pain    HPI Alyssa Valentine is a 72 y.o. female.   Patient presents with right-sided flank pain that started about 3 to 4 weeks ago.  Patient reports the pain is a constant, sharp pain.  She does not report taking medications to help alleviate symptoms.  Denies nausea, vomiting, diarrhea, constipation, dysuria, urinary frequency, blood in urine, vaginal discharge.  Patient reporting normal bowel movements with last bowel movement being today.  Denies blood in stool.  Patient denies any injury to the area.  Reports some movement causes exacerbation of pain.     Past Medical History:  Diagnosis Date   Anemia    many years ago   Anxiety    Arthritis    Depression    GERD (gastroesophageal reflux disease)    Glaucoma    Headache    hx of migraines, no longer have them   HTN (hypertension)     Patient Active Problem List   Diagnosis Date Noted   Degenerative scoliosis in adult patient 05/28/2022   Spondylolisthesis of lumbar region 08/26/2016   Radius distal fracture 01/04/2016   Tinea unguium 02/23/2014   Insomnia 02/23/2014   Other and unspecified hyperlipidemia 02/23/2014   HTN (hypertension)     Past Surgical History:  Procedure Laterality Date   BACK SURGERY  2017   CARPAL TUNNEL RELEASE Right 07/17/1989   CARPAL TUNNEL RELEASE Left 01/04/2016   Procedure: CARPAL TUNNEL RELEASE;  Surgeon: Roseanne Kaufman, MD;  Location: [place];  Service: Orthopedics;  Laterality: Left;   CESAREAN SECTION  11/17/1983   COLONOSCOPY N/A 09/15/2013   Procedure: COLONOSCOPY;  Surgeon: Beryle Beams, MD;  Location: [facility];  Service: Endoscopy;  Laterality: N/A;   ESOPHAGOGASTRODUODENOSCOPY N/A 09/15/2013   Procedure: ESOPHAGOGASTRODUODENOSCOPY (EGD);  Surgeon: Beryle Beams, MD;  Location: [facility];  Service:  Endoscopy;  Laterality: N/A;   ORIF RADIAL FRACTURE Left 01/04/2016   Procedure: OPEN REDUCTION INTERNAL FIXATION (ORIF) LEFT RADIAL FRACTURE;  Surgeon: Roseanne Kaufman, MD;  Location: [place];  Service: Orthopedics;  Laterality: Left;   ROTATOR CUFF REPAIR Bilateral 2010 and 2013    OB History     Gravida  8   Para      Term      Preterm      AB  4   Living         SAB      IAB      Ectopic      Multiple      Live Births               Home Medications    Prior to Admission medications   Medication Sig Start Date End Date Taking? Authorizing Provider  cephALEXin (KEFLEX) 250 MG capsule Take 1 capsule (250 mg total) by mouth 4 (four) times daily for 5 days. 11/18/22 11/23/22 Yes Subrina Vecchiarelli, Michele Rockers, FNP  methocarbamol (ROBAXIN) 500 MG tablet Take 1 tablet (500 mg total) by mouth 2 (two) times daily as needed for muscle spasms. 11/18/22  Yes Gayathri Futrell, Hildred Alamin E, FNP  acetaminophen (TYLENOL) 500 MG tablet Take 1,000 mg by mouth daily as needed for mild pain.    [provider]  Ascorbic Acid (VITAMIN C PO) Take 1 tablet by mouth daily.    [provider]  atorvastatin (LIPITOR) 40 MG tablet Take 40 mg by mouth every evening.    [provider]  CALCIUM PO Take 1 tablet by mouth daily.    [provider]  Cholecalciferol (VITAMIN D-3 PO) Take 1 capsule by mouth daily.    [provider]  diclofenac Sodium (VOLTAREN) 1 % GEL Apply 1 Application topically 2 (two) times daily as needed (pain).    [provider]  docusate sodium (COLACE) 100 MG capsule Take 1 capsule (100 mg total) by mouth 2 (two) times daily. 05/29/22   [name], NP  ferrous sulfate 325 (65 FE) MG tablet Take 1 tablet (325 mg total) by mouth 2 (two) times daily with a meal. 05/29/22   [name], NP  hydrochlorothiazide (HYDRODIURIL) 25 MG tablet Take 25 mg by mouth daily.    [provider]  latanoprost (XALATAN) 0.005 % ophthalmic solution  Place 1 drop into both eyes at bedtime. 12/09/15   [provider]  Multiple Vitamin (MULTIVITAMIN WITH MINERALS) TABS tablet Take 1 tablet by mouth daily.    [provider]  ofloxacin (OCUFLOX) 0.3 % ophthalmic solution Place 1 drop into the left eye 4 (four) times daily. 09/18/22   [name], FNP  omeprazole (PRILOSEC OTC) 20 MG tablet Take 20 mg by mouth daily.    [provider]  oxyCODONE-acetaminophen (PERCOCET) 5-325 MG tablet Take 1-2 tablets by mouth every 4 (four) hours as needed for severe pain. 05/29/22 05/29/23  [name], NP  sertraline (ZOLOFT) 100 MG tablet Take 200 mg by mouth daily. 12/14/15   [provider]  valsartan (DIOVAN) 320 MG tablet Take 320 mg by mouth daily.    [provider]  VITAMIN E PO Take 1 capsule by mouth daily.    [provider]    Family History History reviewed. No pertinent family history.  Social History Social History   Tobacco Use   Smoking status: Never   Smokeless tobacco: Never  Vaping Use   Vaping Use: Never used  Substance Use Topics   Alcohol use: Yes    Comment: occasional   Drug use: No     Allergies   Sulfa antibiotics   Review of Systems Review of Systems Per HPI  Physical Exam Triage Vital Signs ED Triage Vitals  Enc Vitals Group     BP 11/18/22 1706 (!) 145/96     Pulse Rate 11/18/22 1704 (!) 105     Resp 11/18/22 1704 20     Temp 11/18/22 1704 98.3 F (36.8 C)     Temp Source 11/18/22 1704 Oral     SpO2 11/18/22 1704 97 %     Weight --      Height --      Head Circumference --      Peak Flow --      Pain Score 11/18/22 1705 8     Pain Loc --      Pain Edu? --      Excl. in [place]? --    No data found.  Updated Vital Signs BP (!) 145/96 (BP Location: Left Arm)   Pulse (!) 105   Temp 98.3 F (36.8 C) (Oral)   Resp 20   SpO2 97%   Visual Acuity Right Eye Distance:   Left Eye Distance:   Bilateral Distance:    Right Eye Near:   Left  Eye Near:    Bilateral Near:  Physical Exam Constitutional:      General: She is not in acute distress.    Appearance: Normal appearance. She is not toxic-appearing or diaphoretic.  HENT:     Head: Normocephalic and atraumatic.  Eyes:     Extraocular Movements: Extraocular movements intact.     Conjunctiva/sclera: Conjunctivae normal.  Cardiovascular:     Rate and Rhythm: Normal rate and regular rhythm.     Pulses: Normal pulses.     Heart sounds: Normal heart sounds.  Pulmonary:     Effort: Pulmonary effort is normal. No respiratory distress.     Breath sounds: Normal breath sounds.  Abdominal:     General: Bowel sounds are normal. There is no distension.     Palpations: Abdomen is soft.     Tenderness: There is no abdominal tenderness.  Musculoskeletal:     Comments: No tenderness to palpation throughout back or abdomen.  Neurological:     General: No focal deficit present.     Mental Status: She is alert and oriented to person, place, and time. Mental status is at baseline.  Psychiatric:        Mood and Affect: Mood normal.        Behavior: Behavior normal.        Thought Content: Thought content normal.        Judgment: Judgment normal.      UC Treatments / Results  Labs (all labs ordered are listed, but only abnormal results are displayed) Labs Reviewed  POCT URINALYSIS DIP (MANUAL ENTRY) - Abnormal; Notable for the following components:      Result Value   Leukocytes, UA Trace (*)    All other components within normal limits  URINE CULTURE  COMPREHENSIVE METABOLIC PANEL  CBC    EKG   Radiology No results found.  Procedures Procedures (including critical care time)  Medications Ordered in UC Medications - No data to display  Initial Impression / Assessment and Plan / UC Course  I have reviewed the triage vital signs and the nursing notes.  Pertinent labs & imaging results that were available during my care of the patient were reviewed by me and  considered in my medical decision making (see chart for details).     UA completed that showed trace amount of leukocytes.  Unsure if this related to patient's flank pain.  Although, with trace leukocytes and associated flank pain, will opt to treat with cephalexin for UTI.  Creatinine clearance is 41 so no dosage adjustment necessary.  Urine culture pending.  Patient's pain could be musculoskeletal in nature given movement exacerbates it.  Will treat with muscle relaxer as well.  Advised patient muscle relaxer can cause drowsiness.  Patient reports that she takes Norco and has taken norco with a muscle relaxer before and tolerated well.  Reiterated to patient the muscle relaxer can cause drowsiness and to use sparingly and take separate from narcotic if able.  Patient advised to not drive or drink alcohol while taking these medications.  Discussed supportive care with patient.  CMP and CBC pending to rule out any other worrisome etiologies.  Patient is mildly tachycardic but this is not far from patient's baseline so no concern for this at this time.  Pain could be contributing to tachycardia.  Patient advised to follow-up if any symptoms persist or worsen at the ER.  Patient verbalized understanding and was agreeable with plan. Final Clinical Impressions(s) / UC Diagnoses   Final diagnoses:  Right flank pain  Discharge Instructions      I am prescribing an antibiotic due to concern of urinary tract infection.  Urine culture is pending.  We will call if it is abnormal.  Blood work is also pending.  Will call if it is abnormal.  I have also prescribed a muscle relaxer in case symptoms are related to musculoskeletal.  Please use it sparingly and be advised that it can cause drowsiness and do not drive or drink alcohol with taking it.  Please follow-up with the ER if symptoms persist or worsen.    ED Prescriptions     Medication Sig Dispense Auth. Provider   cephALEXin (KEFLEX) 250 MG  capsule Take 1 capsule (250 mg total) by mouth 4 (four) times daily for 5 days. 20 capsule [place], [place] E, [place]   methocarbamol (ROBAXIN) 500 MG tablet Take 1 tablet (500 mg total) by mouth 2 (two) times daily as needed for muscle spasms. 20 tablet [employer], Michele Rockers, [place]      PDMP not reviewed this encounter.   [name], [place] 11/18/22 [id number]

## 2022-11-18 NOTE — Discharge Instructions (Addendum)
I am prescribing an antibiotic due to concern of urinary tract infection.  Urine culture is pending.  We will call if it is abnormal.  Blood work is also pending.  Will call if it is abnormal.  I have also prescribed a muscle relaxer in case symptoms are related to musculoskeletal.  Please use it sparingly and be advised that it can cause drowsiness and do not drive or drink alcohol with taking it.  Please follow-up with the ER if symptoms persist or worsen.

## 2022-11-18 NOTE — ED Triage Notes (Signed)
Pt c/o right flank pain onset ~ 3-4 weeks ago. Described as a dull pain.

## 2022-11-19 NOTE — Progress Notes (Signed)
Cardiology Clinic Note   Patient Name: Alyssa Valentine Date of Encounter: 11/20/2022  Primary Care Provider:  Merrilee Seashore, MD Primary Cardiologist:  Buford Dresser, MD  Patient Profile    72 year old female a hx of hypertension, hyperlipidemia  who was last seen in the office by Dr. Harrell Gave on 08/28/2020 for evaluation of CVRF. No testing was ordered or medications adjusted as her BP was at goal and she remained on statin therapy. Recently seen in ED on 11/18/2021 for flank pain and treated for UTI. She is to have left reverse total shoulder arthroplasty on TBD with Dr. Esmond Plants of [facility].   Past Medical History    Past Medical History:  Diagnosis Date   Anemia    many years ago   Anxiety    Arthritis    Depression    GERD (gastroesophageal reflux disease)    Glaucoma    Headache    hx of migraines, no longer have them   HTN (hypertension)    Past Surgical History:  Procedure Laterality Date   BACK SURGERY  2017   CARPAL TUNNEL RELEASE Right 07/17/1989   CARPAL TUNNEL RELEASE Left 01/04/2016   Procedure: CARPAL TUNNEL RELEASE;  Surgeon: Roseanne Kaufman, MD;  Location: [facility];  Service: Orthopedics;  Laterality: Left;   CESAREAN SECTION  11/17/1983   COLONOSCOPY N/A 09/15/2013   Procedure: COLONOSCOPY;  Surgeon: Beryle Beams, MD;  Location: [facility];  Service: Endoscopy;  Laterality: N/A;   ESOPHAGOGASTRODUODENOSCOPY N/A 09/15/2013   Procedure: ESOPHAGOGASTRODUODENOSCOPY (EGD);  Surgeon: Beryle Beams, MD;  Location: [facility];  Service: Endoscopy;  Laterality: N/A;   ORIF RADIAL FRACTURE Left 01/04/2016   Procedure: OPEN REDUCTION INTERNAL FIXATION (ORIF) LEFT RADIAL FRACTURE;  Surgeon: Roseanne Kaufman, MD;  Location: [place];  Service: Orthopedics;  Laterality: Left;   ROTATOR CUFF REPAIR Bilateral 2010 and 2013    Allergies  Allergies  Allergen Reactions   Sulfa Antibiotics Itching and Swelling    History of Present Illness     [name] comes today for preoperative evaluation for right shoulder surgery with Dr. Mariea Stable with [facility] on date to be determined.  She is without any complaints of chest pain, dyspnea on exertion, excessive fatigue, or dizziness.  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  She is medically compliant.  Labs are completed by primary care along with medication refills.  Home Medications    Current Outpatient Medications  Medication Sig Dispense Refill   acetaminophen (TYLENOL) 500 MG tablet Take 1,000 mg by mouth daily as needed for mild pain.     Ascorbic Acid (VITAMIN C PO) Take 1 tablet by mouth daily.     atorvastatin (LIPITOR) 40 MG tablet Take 40 mg by mouth every evening.     CALCIUM PO Take 1 tablet by mouth daily.     cephALEXin (KEFLEX) 250 MG capsule Take 1 capsule (250 mg total) by mouth 4 (four) times daily for 5 days. 20 capsule 0   Cholecalciferol (VITAMIN D-3 PO) Take 1 capsule by mouth daily.     diclofenac Sodium (VOLTAREN) 1 % GEL Apply 1 Application topically 2 (two) times daily as needed (pain).     hydrochlorothiazide (HYDRODIURIL) 25 MG tablet Take 25 mg by mouth daily.     latanoprost (XALATAN) 0.005 % ophthalmic solution Place 1 drop into both eyes at bedtime.  4   methocarbamol (ROBAXIN) 500  MG tablet Take 1 tablet (500 mg total) by mouth 2 (two) times daily as needed for muscle spasms. 20 tablet 0   Multiple Vitamin (MULTIVITAMIN WITH MINERALS) TABS tablet Take 1 tablet by mouth daily.     ofloxacin (OCUFLOX) 0.3 % ophthalmic solution Place 1 drop into the left eye 4 (four) times daily. 5 mL 0   omeprazole (PRILOSEC OTC) 20 MG tablet Take 20 mg by mouth daily.     oxyCODONE-acetaminophen (PERCOCET) 5-325 MG tablet Take 1-2 tablets by mouth every 4 (four) hours as needed for severe pain. 30 tablet 0   sertraline (ZOLOFT) 100 MG tablet Take 200 mg by mouth daily.  3   valsartan (DIOVAN) 320  MG tablet Take 320 mg by mouth daily.     VITAMIN E PO Take 1 capsule by mouth daily.     docusate sodium (COLACE) 100 MG capsule Take 1 capsule (100 mg total) by mouth 2 (two) times daily. (Patient not taking: Reported on 11/20/2022) 10 capsule 0   ferrous sulfate 325 (65 FE) MG tablet Take 1 tablet (325 mg total) by mouth 2 (two) times daily with a meal. (Patient not taking: Reported on 11/20/2022) 14 tablet 0   No current facility-administered medications for this visit.     Family History    No family history on file. She indicated that her mother is deceased. She indicated that her father is deceased.  Social History    Social History   Socioeconomic History   Marital status: Married    Spouse name: Not on file   Number of children: Not on file   Years of education: Not on file   Highest education level: Not on file  Occupational History   Not on file  Tobacco Use   Smoking status: Never   Smokeless tobacco: Never  Vaping Use   Vaping Use: Never used  Substance and Sexual Activity   Alcohol use: Yes    Comment: occasional   Drug use: No   Sexual activity: Not on file  Other Topics Concern   Not on file  Social History Narrative   Not on file   Social Determinants of Health   Financial Resource Strain: Not on file  Food Insecurity: Not on file  Transportation Needs: Not on file  Physical Activity: Not on file  Stress: Not on file  Social Connections: Not on file  Intimate Partner Violence: Not on file     Review of Systems    General:  No chills, fever, night sweats or weight changes.  Cardiovascular:  No chest pain, dyspnea on exertion, edema, orthopnea, palpitations, paroxysmal nocturnal dyspnea. Dermatological: No rash, lesions/masses Respiratory: No cough, dyspnea Urologic: No hematuria, dysuria Abdominal:   No nausea, vomiting, diarrhea, bright red blood per rectum, melena, or hematemesis Neurologic:  No visual changes, wkns, changes in mental  status. All other systems reviewed and are otherwise negative except as noted above.     Physical Exam    VS:  BP 125/80   Pulse 96   Ht '5\' 1"'$  (1.549 m)   Wt 155 lb (70.3 kg)   SpO2 93%   BMI 29.29 kg/m  , BMI Body mass index is 29.29 kg/m.     GEN: Well nourished, well developed, in no acute distress. HEENT: normal. Neck: Supple, no JVD, carotid bruits, or masses. Cardiac: RRR, no murmurs, rubs, or gallops. No clubbing, cyanosis, edema.  Radials/DP/PT 2+ and equal bilaterally.  Respiratory:  Respirations regular and unlabored, clear  to auscultation bilaterally. GI: Soft, nontender, nondistended, BS + x 4. MS: no deformity or atrophy.  Pain with range of motion of shoulders bilaterally, and knees bilaterally. Skin: warm and dry, no rash. Neuro:  Strength and sensation are intact. Psych: Normal affect.  Accessory Clinical Findings    ECG personally reviewed by me today-normal sinus rhythm with right atrial enlargement heart rate of 96 bpm  Lab Results  Component Value Date   WBC 4.5 11/18/2022   HGB 12.2 11/18/2022   HCT 38.0 11/18/2022   MCV 85 11/18/2022   PLT 169 11/18/2022   Lab Results  Component Value Date   CREATININE 1.21 (H) 11/18/2022   BUN 21 11/18/2022   NA 140 11/18/2022   K 3.9 11/18/2022   CL 105 11/18/2022   CO2 17 (L) 11/18/2022   Lab Results  Component Value Date   ALT 26 11/18/2022   AST 26 11/18/2022   ALKPHOS 110 11/18/2022   BILITOT 0.5 11/18/2022   No results found for: "CHOL", "HDL", "LDLCALC", "LDLDIRECT", "TRIG", "CHOLHDL"  No results found for: "HGBA1C"  Review of Prior Studies:  No new cardiovascular studies. Assessment & Plan   1.  Cardiovascular preoperative evaluation:  Chart reviewed as part of pre-operative protocol coverage.  Make sure given past medical history and time since last visit, based on ACC/AHA guidelines, JACHELLE FLUTY would be at acceptable risk for the planned procedure without further cardiovascular  testing.  2.  Hypertension: Blood pressure is well-controlled.  Continue current medication regimen.  She may need to hold HCTZ prior to surgery to prevent frequent urination at the discretion of the surgeon.  She should continue valsartan 320 mg daily and HCTZ otherwise.  3. Hyperlipidemia: Continue atorvastatin 40 mg daily.  Labs are completed by PCP.  11/20/2022,       Current medicines are reviewed at length with the patient today.  I have spent 25 min's  dedicated to the care of this patient on the date of this encounter to include pre-visit review of records, assessment, management and diagnostic testing,with shared decision making. Signed, Phill Myron. West Pugh, ANP, AACC   11/20/2022 4:21 PM      Office [phone] Fax [phone]  Notice: This dictation was prepared with Dragon dictation along with smaller phrase technology. Any transcriptional errors that result from this process are unintentional and may not be corrected upon review.

## 2022-11-20 ENCOUNTER — Encounter: Payer: Self-pay | Admitting: Adult Health

## 2022-11-20 ENCOUNTER — Ambulatory Visit: Payer: Federal, State, Local not specified - PPO | Attending: Adult Health | Admitting: Adult Health

## 2022-11-20 VITALS — BP 125/80 | HR 96 | Ht 61.0 in | Wt 155.0 lb

## 2022-11-20 DIAGNOSIS — E7800 Pure hypercholesterolemia, unspecified: Secondary | ICD-10-CM

## 2022-11-20 DIAGNOSIS — Z01818 Encounter for other preprocedural examination: Secondary | ICD-10-CM | POA: Diagnosis not present

## 2022-11-20 DIAGNOSIS — I10 Essential (primary) hypertension: Secondary | ICD-10-CM | POA: Diagnosis not present

## 2022-11-20 LAB — COMPREHENSIVE METABOLIC PANEL
ALT: 26 IU/L (ref 0–32)
AST: 26 IU/L (ref 0–40)
Albumin/Globulin Ratio: 1.8 (ref 1.2–2.2)
Albumin: 4.4 g/dL (ref 3.8–4.8)
Alkaline Phosphatase: 110 IU/L (ref 44–121)
BUN/Creatinine Ratio: 17 (ref 12–28)
BUN: 21 mg/dL (ref 8–27)
Bilirubin Total: 0.5 mg/dL (ref 0.0–1.2)
CO2: 17 mmol/L — ABNORMAL LOW (ref 20–29)
Calcium: 9.7 mg/dL (ref 8.7–10.3)
Chloride: 105 mmol/L (ref 96–106)
Creatinine, Ser: 1.21 mg/dL — ABNORMAL HIGH (ref 0.57–1.00)
Globulin, Total: 2.5 g/dL (ref 1.5–4.5)
Glucose: 108 mg/dL — ABNORMAL HIGH (ref 70–99)
Potassium: 3.9 mmol/L (ref 3.5–5.2)
Sodium: 140 mmol/L (ref 134–144)
Total Protein: 6.9 g/dL (ref 6.0–8.5)
eGFR: 48 mL/min/{1.73_m2} — ABNORMAL LOW (ref >59)

## 2022-11-20 LAB — CBC
Hematocrit: 38 % (ref 34.0–46.6)
Hemoglobin: 12.2 g/dL (ref 11.1–15.9)
MCH: 27.2 pg (ref 26.6–33.0)
MCHC: 32.1 g/dL (ref 31.5–35.7)
MCV: 85 fL (ref 79–97)
Platelets: 169 10*3/uL (ref 150–450)
RBC: 4.48 x10E6/uL (ref 3.77–5.28)
RDW: 17.7 % — ABNORMAL HIGH (ref 11.7–15.4)
WBC: 4.5 10*3/uL (ref 3.4–10.8)

## 2022-11-20 NOTE — Patient Instructions (Signed)
Medication Instructions:  No changes *If you need a refill on your cardiac medications before your next appointment, please call your pharmacy*   Lab Work: No Labs If you have labs (blood work) drawn today and your tests are completely normal, you will receive your results only by: [place] (if you have MyChart) OR A paper copy in the mail If you have any lab test that is abnormal or we need to change your treatment, we will call you to review the results.   Testing/Procedures: No Testing   Follow-Up: At [facility], you and your health needs are our priority.  As part of our continuing mission to provide you with exceptional heart care, we have created designated Provider Care Teams.  These Care Teams include your primary Cardiologist (physician) and Advanced Practice Providers (APPs -  Physician Assistants and Nurse Practitioners) who all work together to provide you with the care you need, when you need it.  We recommend signing up for the patient portal called "MyChart".  Sign up information is provided on this After Visit Summary.  MyChart is used to connect with patients for Virtual Visits (Telemedicine).  Patients are able to view lab/test results, encounter notes, upcoming appointments, etc.  Non-urgent messages can be sent to your provider as well.   To learn more about what you can do with MyChart, go to [email or web address].    Your next appointment:   6 month(s)  The format for your next appointment:   In Person  Provider:   Buford Dresser, MD

## 2022-11-21 ENCOUNTER — Emergency Department (HOSPITAL_COMMUNITY): Payer: Federal, State, Local not specified - PPO

## 2022-11-21 ENCOUNTER — Emergency Department (HOSPITAL_COMMUNITY)
Admission: EM | Admit: 2022-11-21 | Discharge: 2022-11-21 | Disposition: A | Discharge status: Home / Self Care | Payer: Federal, State, Local not specified - PPO | Attending: Emergency Medicine | Admitting: Emergency Medicine

## 2022-11-21 ENCOUNTER — Encounter (HOSPITAL_COMMUNITY): Payer: Self-pay

## 2022-11-21 DIAGNOSIS — M7918 Myalgia, other site: Secondary | ICD-10-CM | POA: Diagnosis not present

## 2022-11-21 DIAGNOSIS — Z79899 Other long term (current) drug therapy: Secondary | ICD-10-CM | POA: Diagnosis not present

## 2022-11-21 DIAGNOSIS — I10 Essential (primary) hypertension: Secondary | ICD-10-CM | POA: Insufficient documentation

## 2022-11-21 DIAGNOSIS — R109 Unspecified abdominal pain: Secondary | ICD-10-CM

## 2022-11-21 DIAGNOSIS — R1031 Right lower quadrant pain: Secondary | ICD-10-CM | POA: Insufficient documentation

## 2022-11-21 LAB — BASIC METABOLIC PANEL
Anion gap: 9 (ref 5–15)
BUN: 26 mg/dL — ABNORMAL HIGH (ref 8–23)
CO2: 23 mmol/L (ref 22–32)
Calcium: 9.1 mg/dL (ref 8.9–10.3)
Chloride: 108 mmol/L (ref 98–111)
Creatinine, Ser: 1.24 mg/dL — ABNORMAL HIGH (ref 0.44–1.00)
GFR, Estimated: 46 mL/min — ABNORMAL LOW (ref >60)
Glucose, Bld: 95 mg/dL (ref 70–99)
Potassium: 4.3 mmol/L (ref 3.5–5.1)
Sodium: 140 mmol/L (ref 135–145)

## 2022-11-21 LAB — URINALYSIS, ROUTINE W REFLEX MICROSCOPIC
Bilirubin Urine: NEGATIVE
Glucose, UA: NEGATIVE mg/dL
Hgb urine dipstick: NEGATIVE
Ketones, ur: NEGATIVE mg/dL
Leukocytes,Ua: NEGATIVE
Nitrite: NEGATIVE
Protein, ur: NEGATIVE mg/dL
Specific Gravity, Urine: 1.012 (ref 1.005–1.030)
pH: 5 (ref 5.0–8.0)

## 2022-11-21 LAB — CBC
HCT: 36.5 % (ref 36.0–46.0)
Hemoglobin: 11.7 g/dL — ABNORMAL LOW (ref 12.0–15.0)
MCH: 26.7 pg (ref 26.0–34.0)
MCHC: 32.1 g/dL (ref 30.0–36.0)
MCV: 83.1 fL (ref 80.0–100.0)
Platelets: 148 10*3/uL — ABNORMAL LOW (ref 150–400)
RBC: 4.39 MIL/uL (ref 3.87–5.11)
RDW: 17.2 % — ABNORMAL HIGH (ref 11.5–15.5)
WBC: 3.5 10*3/uL — ABNORMAL LOW (ref 4.0–10.5)
nRBC: 0 % (ref 0.0–0.2)

## 2022-11-21 LAB — URINE CULTURE

## 2022-11-21 NOTE — Discharge Instructions (Signed)
You were seen in the emergency department for right flank pain.  As we discussed, your blood work, urine sample, and CT scan was all reassuring today. I suspect your pain is likely related to musculoskeletal pain.  I would recommend continuing the current regimen you're on. You can follow up with your primary doctor to try other methods if your symptoms persist.  Continue to monitor how you're doing and return to the ER for new or worsening symptoms.

## 2022-11-21 NOTE — ED Triage Notes (Signed)
Pt arrived via POV, c/o right flank pain x1 month. States pain is progressively worsening throughout the month. Was seen at [facility], neg blood work and urine, told to come to ED for CT if pain did not subside. Denies any n/v, diarrhea, or urinary sx

## 2022-11-21 NOTE — ED Notes (Signed)
PT IS IN CT will obtain vitals on return

## 2022-11-21 NOTE — ED Provider Notes (Signed)
[facility] DEPT Provider Note   CSN: [id number] Arrival date & time: 11/21/22  0806     History  Chief Complaint  Patient presents with   Flank Pain    Alyssa Valentine is a 72 y.o. female with history of hypertension, GERD, depression who presents the emergency department complaining of right flank pain for the past month.  Patient states that her symptoms are progressively worsening. Pain made worse with standing up straight, bending over, and rotational movements. She was seen in urgent care the other day and had negative blood work.  Her urine sample showed a possible UTI, so she was treated with antibiotics.  She was told to come to the ER for CT if pain did not subside.  Denies any fever, chills, nausea, vomiting, diarrhea, or urinary symptoms.   Flank Pain       Home Medications Prior to Admission medications   Medication Sig Start Date End Date Taking? Authorizing Provider  acetaminophen (TYLENOL) 500 MG tablet Take 1,000 mg by mouth daily as needed for mild pain.    [provider]  Ascorbic Acid (VITAMIN C PO) Take 1 tablet by mouth daily.    [provider]  atorvastatin (LIPITOR) 40 MG tablet Take 40 mg by mouth every evening.    [provider]  CALCIUM PO Take 1 tablet by mouth daily.    [provider]  cephALEXin (KEFLEX) 250 MG capsule Take 1 capsule (250 mg total) by mouth 4 (four) times daily for 5 days. 11/18/22 11/23/22  [name], FNP  Cholecalciferol (VITAMIN D-3 PO) Take 1 capsule by mouth daily.    [provider]  diclofenac Sodium (VOLTAREN) 1 % GEL Apply 1 Application topically 2 (two) times daily as needed (pain).    [provider]  docusate sodium (COLACE) 100 MG capsule Take 1 capsule (100 mg total) by mouth 2 (two) times daily. Patient not taking: Reported on 11/20/2022 05/29/22   [name], NP  ferrous sulfate 325 (65 FE) MG tablet Take 1 tablet (325 mg total)  by mouth 2 (two) times daily with a meal. Patient not taking: Reported on 11/20/2022 05/29/22   [name], NP  hydrochlorothiazide (HYDRODIURIL) 25 MG tablet Take 25 mg by mouth daily.    [provider]  latanoprost (XALATAN) 0.005 % ophthalmic solution Place 1 drop into both eyes at bedtime. 12/09/15   [provider]  methocarbamol (ROBAXIN) 500 MG tablet Take 1 tablet (500 mg total) by mouth 2 (two) times daily as needed for muscle spasms. 11/18/22   [name], FNP  Multiple Vitamin (MULTIVITAMIN WITH MINERALS) TABS tablet Take 1 tablet by mouth daily.    [provider]  ofloxacin (OCUFLOX) 0.3 % ophthalmic solution Place 1 drop into the left eye 4 (four) times daily. 09/18/22   [name], FNP  omeprazole (PRILOSEC OTC) 20 MG tablet Take 20 mg by mouth daily.    [provider]  oxyCODONE-acetaminophen (PERCOCET) 5-325 MG tablet Take 1-2 tablets by mouth every 4 (four) hours as needed for severe pain. 05/29/22 05/29/23  [name], NP  sertraline (ZOLOFT) 100 MG tablet Take 200 mg by mouth daily. 12/14/15   [provider]  valsartan (DIOVAN) 320 MG tablet Take 320 mg by mouth daily.    [provider]  VITAMIN E PO Take 1 capsule by mouth daily.    [provider]      Allergies  Sulfa antibiotics    Review of Systems   Review of Systems  Genitourinary:  Positive for flank pain.  All other systems reviewed and are negative.   Physical Exam Updated Vital Signs BP (!) 147/81   Pulse 81   Temp 98 F (36.7 C) (Oral)   Resp 17   SpO2 95%  Physical Exam Vitals and nursing note reviewed.  Constitutional:      Appearance: Normal appearance.  HENT:     Head: Normocephalic and atraumatic.  Eyes:     Conjunctiva/sclera: Conjunctivae normal.  Cardiovascular:     Rate and Rhythm: Normal rate and regular rhythm.  Pulmonary:     Effort: Pulmonary effort is normal. No respiratory distress.     Breath  sounds: Normal breath sounds.  Abdominal:     General: There is no distension.     Palpations: Abdomen is soft.     Tenderness: There is no abdominal tenderness. There is no right CVA tenderness, left CVA tenderness, guarding or rebound.  Skin:    General: Skin is warm and dry.  Neurological:     General: No focal deficit present.     Mental Status: She is alert.     ED Results / Procedures / Treatments   Labs (all labs ordered are listed, but only abnormal results are displayed) Labs Reviewed  CBC - Abnormal; Notable for the following components:      Result Value   WBC 3.5 (*)    Hemoglobin 11.7 (*)    RDW 17.2 (*)    Platelets 148 (*)    All other components within normal limits  BASIC METABOLIC PANEL - Abnormal; Notable for the following components:   BUN 26 (*)    Creatinine, Ser 1.24 (*)    GFR, Estimated 46 (*)    All other components within normal limits  URINALYSIS, ROUTINE W REFLEX MICROSCOPIC    EKG None  Radiology CT ABDOMEN PELVIS WO CONTRAST  Result Date: 11/21/2022 CLINICAL DATA:  Abdominal pain, flank pain EXAM: CT ABDOMEN AND PELVIS WITHOUT CONTRAST TECHNIQUE: Multidetector CT imaging of the abdomen and pelvis was performed following the standard protocol without IV contrast. RADIATION DOSE REDUCTION: This exam was performed according to the departmental dose-optimization program which includes automated exposure control, adjustment of the mA and/or kV according to patient size and/or use of iterative reconstruction technique. COMPARISON:  09/04/2020 FINDINGS: Lower chest: Coronary artery calcifications are seen. There is mild ectasia of bronchi in the lower lung fields. Small linear densities are seen in lower lung fields suggesting scarring. Hepatobiliary: No focal abnormalities are seen in liver. There is no dilation of bile ducts. Gallbladder is not distended. Pancreas: No focal abnormalities are seen. Spleen: Unremarkable. Adrenals/Urinary Tract: Adrenals  are unremarkable. There is no hydronephrosis. There are no renal or ureteral stones. Urinary bladder is unremarkable. Stomach/Bowel: Stomach is not distended. Small bowel loops are not dilated. Appendix is not dilated. There is no significant wall thickening in colon. Scattered diverticula are seen in colon without signs of focal diverticulitis. Vascular/Lymphatic: Scattered arterial calcifications are seen. Reproductive: There is 2.7 cm smooth marginated fluid density structure in left adnexa. There are no internal septations. This finding has not changed significantly. No follow-up imaging is recommended. Other: There is no ascites or pneumoperitoneum. There is diastasis between the rectus muscles at the level of umbilicus. There is 2.1 cm lipoma in the muscles anterior to the right hip with no significant change. Musculoskeletal: There is previous surgical fusion from L2-L5  levels. Intervertebral disc spaces are noted in place. There is left presacral neurostimulator lead in place. IMPRESSION: There is no evidence of intestinal obstruction or pneumoperitoneum. There is no hydronephrosis. There are no renal or ureteral stones. Appendix is not dilated. Few diverticula are seen in colon without signs of focal diverticulitis. Coronary artery calcifications are seen. Aortic atherosclerosis. Other findings as described in the body of the report. Electronically Signed   By: Elmer Picker M.D.   On: 11/21/2022 11:23    Procedures Procedures    Medications Ordered in ED Medications - No data to display  ED Course/ Medical Decision Making/ A&P                           Medical Decision Making Amount and/or Complexity of Data Reviewed Labs: ordered. Radiology: ordered.  This patient is a 72 y.o. female  who presents to the ED for concern of right sided flank pain x 1 month.   Differential diagnoses prior to evaluation: The emergent differential diagnosis includes, but is not limited to,  AAA, renal  artery/vein embolism/thrombosis, mesenteric ischemia, pyelonephritis, nephrolithiasis, cystitis, biliary colic, pancreatitis, perforated peptic ulcer, appendicitis, diverticulitis, bowel obstruction, ovarian torsion.  Past Medical History / Co-morbidities: hypertension, GERD, depression  Additional history: Chart reviewed. Pertinent results include: Patient presented to outside ER for similar symptoms on 12/29, left without being seen after triage.  Seen at urgent care on 1/3, had urinalysis with trace leukocytes, provider opted to treat with Keflex for UTI.  Urine culture with 10,000 colonies gram-positive cocci, identification to follow.  Physical Exam: Physical exam performed. The pertinent findings include: Hypertensive, but otherwise normal vital signs. Abdomen soft, non-tender. No CVA tenderness.   Lab Tests/Imaging studies: I personally interpreted labs/imaging and the pertinent results include:  Unremarkable CBC. BMP with elevated creatine and decreased GFR, consistent with prior. Urinalysis negative for hematuria or infection.  CT abdomen/pelvis shows no acute intraabdominal findings. I agree with the radiologist interpretation.  Disposition: After consideration of the diagnostic results and the patients response to treatment, I feel that emergency department workup does not suggest an emergent condition requiring admission or immediate intervention beyond what has been performed at this time. The plan is: discharge to home with symptomatic management of likely musculoskeletal pain. Patient is clinically well appearing with reassuring exam and laboratory workup plus imaging. The patient is safe for discharge and has been instructed to return immediately for worsening symptoms, change in symptoms or any other concerns.  Final Clinical Impression(s) / ED Diagnoses Final diagnoses:  Right flank pain  Musculoskeletal pain    Rx / DC Orders ED Discharge Orders     None       Portions of this report may have been transcribed using voice recognition software. Every effort was made to ensure accuracy; however, inadvertent computerized transcription errors may be present.    Estill Cotta 11/21/22 1151    Valarie Merino, MD 11/21/22 1357

## 2023-05-18 ENCOUNTER — Telehealth (HOSPITAL_BASED_OUTPATIENT_CLINIC_OR_DEPARTMENT_OTHER): Payer: Self-pay | Admitting: Cardiology

## 2023-05-18 NOTE — Telephone Encounter (Signed)
Left message for patient to call to discuss rescheduling the 07/09/23 2:00 pm appointment with Dr. Cristal Deer (not in the office)  rescheduled to Friday 07/16/23 at 2:00 pm.  Requested patient call back with confirmation---will also mail information to patient

## 2023-06-11 ENCOUNTER — Other Ambulatory Visit: Payer: Self-pay | Admitting: Obstetrics & Gynecology

## 2023-06-11 DIAGNOSIS — E049 Nontoxic goiter, unspecified: Secondary | ICD-10-CM

## 2023-06-24 ENCOUNTER — Other Ambulatory Visit: Payer: Federal, State, Local not specified - PPO

## 2023-07-05 ENCOUNTER — Ambulatory Visit
Admission: RE | Admit: 2023-07-05 | Discharge: 2023-07-05 | Disposition: A | Discharge status: Home / Self Care | Payer: Federal, State, Local not specified - PPO | Source: Ambulatory Visit | Attending: Obstetrics & Gynecology | Admitting: Obstetrics & Gynecology

## 2023-07-05 DIAGNOSIS — E049 Nontoxic goiter, unspecified: Secondary | ICD-10-CM

## 2023-07-06 ENCOUNTER — Ambulatory Visit (HOSPITAL_BASED_OUTPATIENT_CLINIC_OR_DEPARTMENT_OTHER): Payer: Federal, State, Local not specified - PPO | Admitting: Cardiology

## 2023-07-07 ENCOUNTER — Other Ambulatory Visit: Payer: Self-pay | Admitting: Ophthalmology

## 2023-07-07 DIAGNOSIS — H0520 Unspecified exophthalmos: Secondary | ICD-10-CM

## 2023-07-09 ENCOUNTER — Ambulatory Visit (HOSPITAL_BASED_OUTPATIENT_CLINIC_OR_DEPARTMENT_OTHER): Payer: Federal, State, Local not specified - PPO | Admitting: Cardiology

## 2023-07-16 ENCOUNTER — Ambulatory Visit (HOSPITAL_BASED_OUTPATIENT_CLINIC_OR_DEPARTMENT_OTHER): Payer: Federal, State, Local not specified - PPO | Admitting: Cardiology

## 2023-08-06 ENCOUNTER — Ambulatory Visit
Admission: RE | Admit: 2023-08-06 | Discharge: 2023-08-06 | Disposition: A | Discharge status: Home / Self Care | Payer: Federal, State, Local not specified - PPO | Source: Ambulatory Visit | Attending: Ophthalmology | Admitting: Ophthalmology

## 2023-08-06 ENCOUNTER — Other Ambulatory Visit: Payer: Self-pay | Admitting: Ophthalmology

## 2023-08-06 DIAGNOSIS — H0520 Unspecified exophthalmos: Secondary | ICD-10-CM

## 2023-08-06 MED ORDER — IOPAMIDOL (ISOVUE-300) INJECTION 61%
500.0000 mL | Freq: Once | INTRAVENOUS | Status: AC | PRN
  Administered 2023-08-06: 75 mL via INTRAVENOUS

## 2023-08-14 ENCOUNTER — Other Ambulatory Visit: Payer: Self-pay

## 2023-08-14 ENCOUNTER — Encounter (HOSPITAL_BASED_OUTPATIENT_CLINIC_OR_DEPARTMENT_OTHER): Payer: Self-pay

## 2023-08-14 ENCOUNTER — Emergency Department (HOSPITAL_BASED_OUTPATIENT_CLINIC_OR_DEPARTMENT_OTHER)
Admission: EM | Admit: 2023-08-14 | Discharge: 2023-08-14 | Discharge status: Against Medical Advice | Payer: Federal, State, Local not specified - PPO | Attending: Emergency Medicine | Admitting: Emergency Medicine

## 2023-08-14 DIAGNOSIS — R21 Rash and other nonspecific skin eruption: Secondary | ICD-10-CM | POA: Diagnosis present

## 2023-08-14 DIAGNOSIS — Z5321 Procedure and treatment not carried out due to patient leaving prior to being seen by health care provider: Secondary | ICD-10-CM | POA: Insufficient documentation

## 2023-08-14 NOTE — ED Triage Notes (Signed)
POV from home, A&O x4, amb to triage  Sts she was outside, thinks she was in poison ivy, rash to neck, axilla and right arm

## 2023-10-04 ENCOUNTER — Ambulatory Visit (HOSPITAL_BASED_OUTPATIENT_CLINIC_OR_DEPARTMENT_OTHER): Payer: Federal, State, Local not specified - PPO | Admitting: Family

## 2023-11-14 IMAGING — XA DG MYELOGRAPHY LUMBAR INJ LUMBOSACRAL
14 of 21 series · 14 of 21 positions shown · non-contrast
Comparison: None Available.

CLINICAL DATA: Chronic bilateral low back pain with bilateral
sciatica

[Series 1: w lumbar spine lat · 0.15mm/px · 1 of 1 slices shown]
[im 1/1]
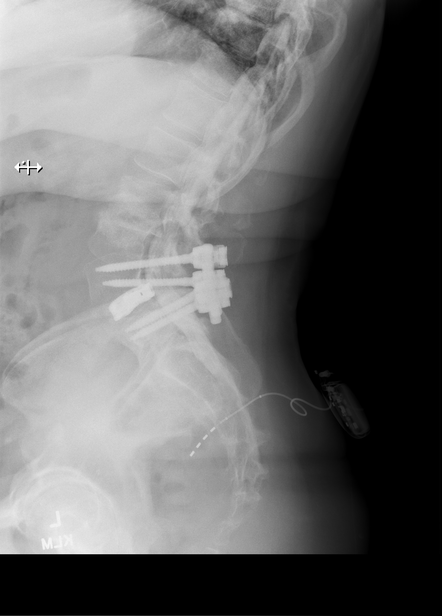

[Series 2: w lumbar spine flexion · 0.15mm/px · 1 of 1 slices shown]
[im 1/1]
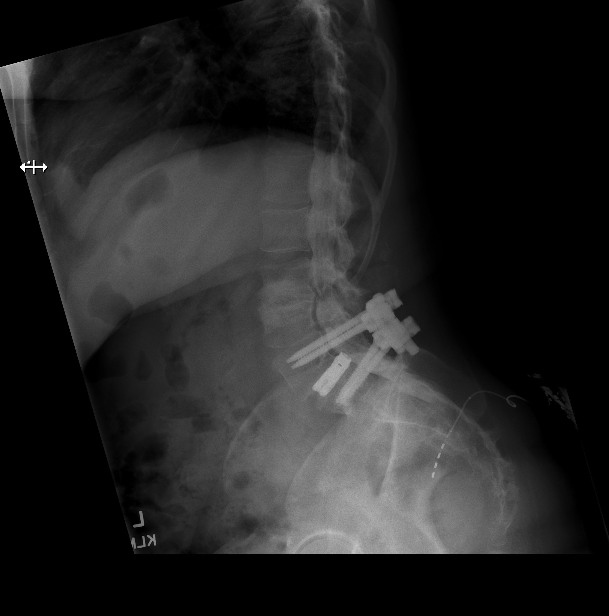

[Series 2: vasc standard · 1 of 1 slices shown (1 of 12)]
[im 1/1]
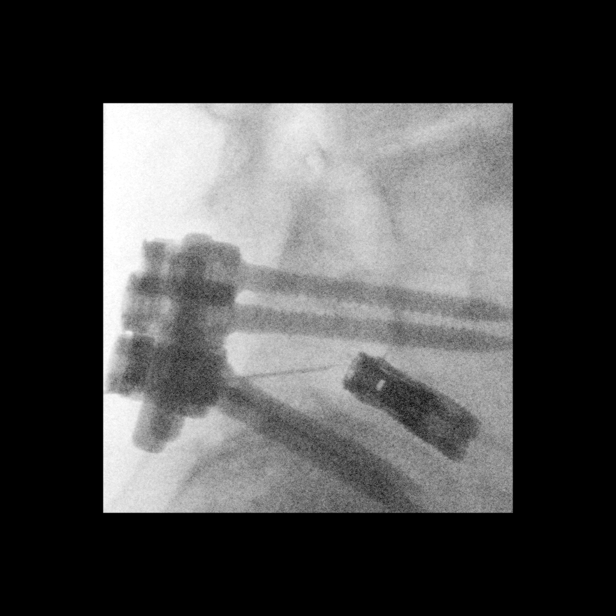

[Series 3: vasc standard · 1 of 1 slices shown (2 of 12)]
[im 1/1]
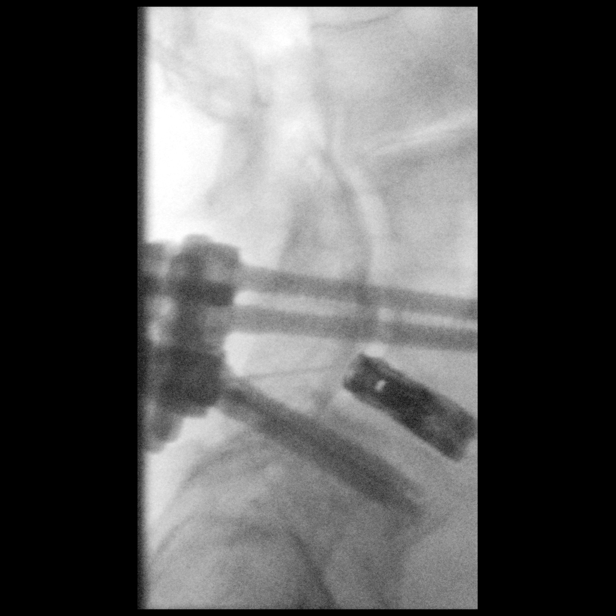

[Series 4: vasc standard · 1 of 1 slices shown (3 of 12)]
[im 1/1]
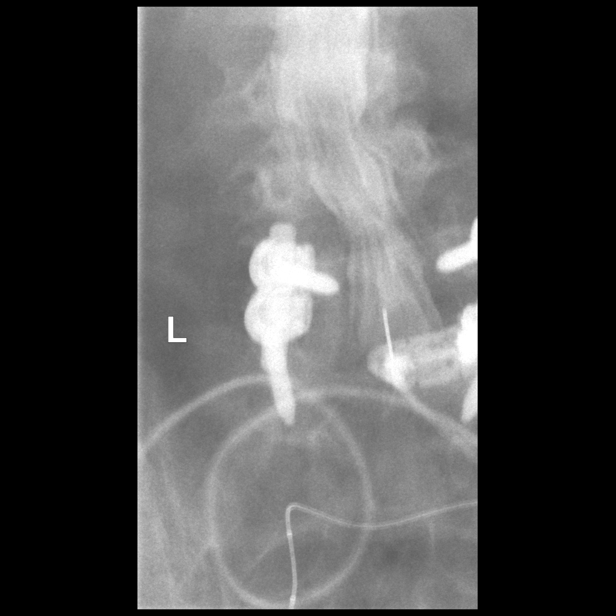

[Series 6: vasc standard · 1 of 1 slices shown (4 of 12)]
[im 1/1]
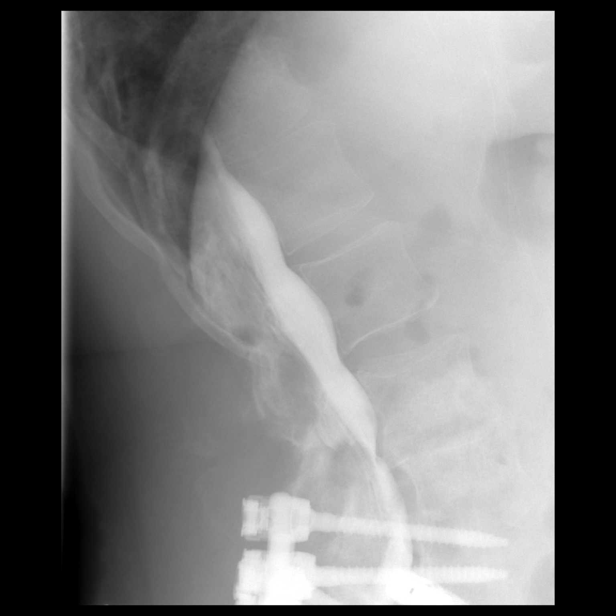

[Series 7: vasc standard · 1 of 1 slices shown (5 of 12)]
[im 1/1]
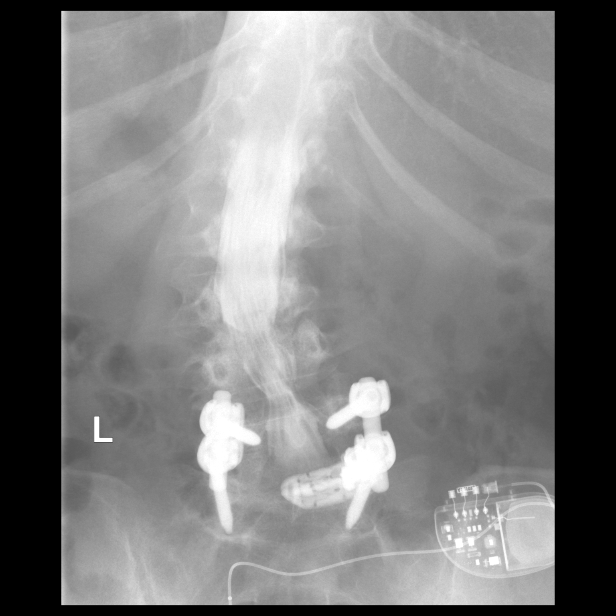

[Series 9: vasc standard · 1 of 1 slices shown (6 of 12)]
[im 1/1]
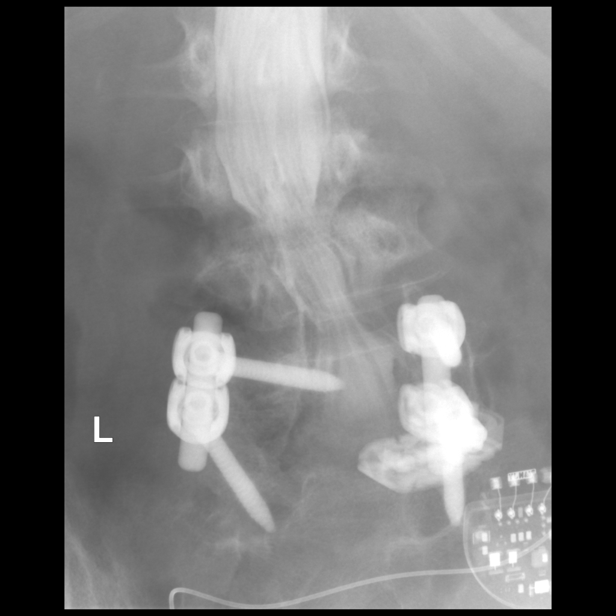

[Series 10: vasc standard · 1 of 1 slices shown (7 of 12)]
[im 1/1]
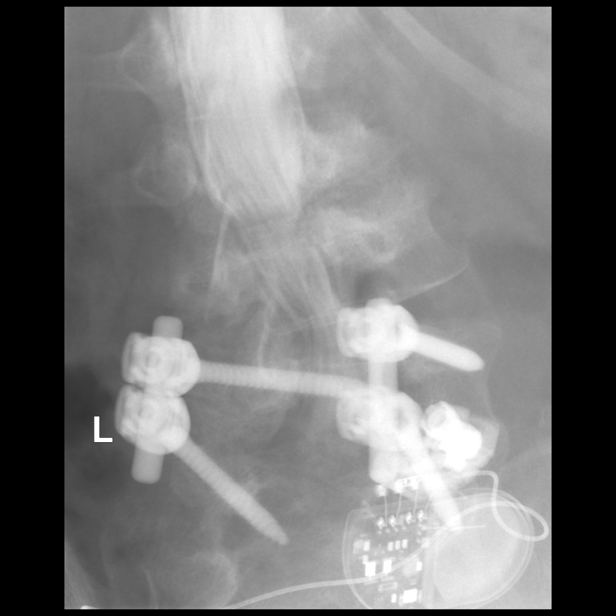

[Series 12: vasc standard · 1 of 1 slices shown (8 of 12)]
[im 1/1]
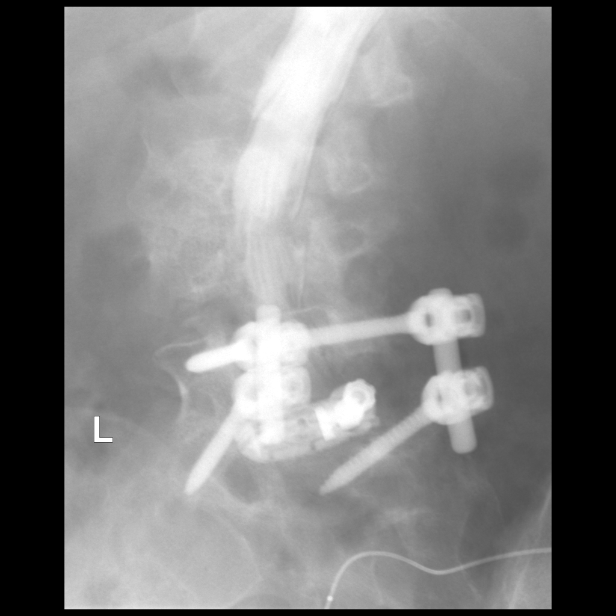

[Series 13: vasc standard · 1 of 1 slices shown (9 of 12)]
[im 1/1]
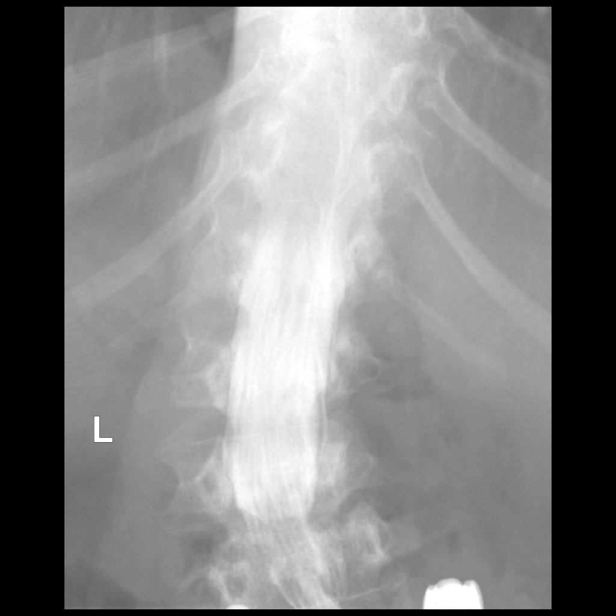

[Series 15: vasc standard · 1 of 1 slices shown (10 of 12)]
[im 1/1]
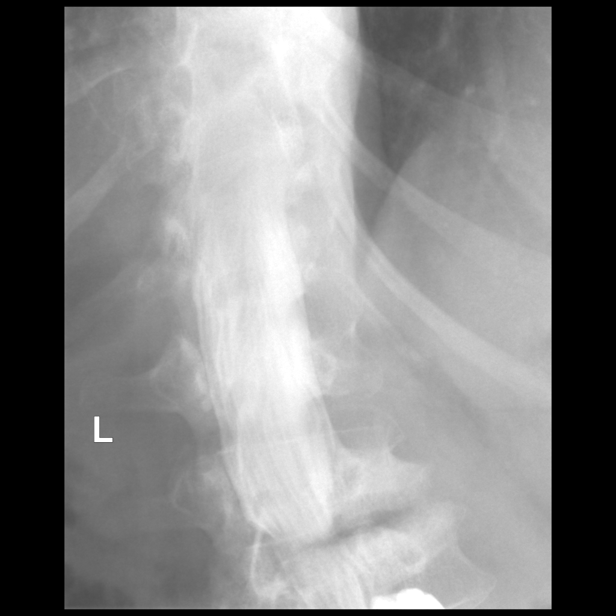

[Series 16: vasc standard · 1 of 1 slices shown (11 of 12)]
[im 1/1]
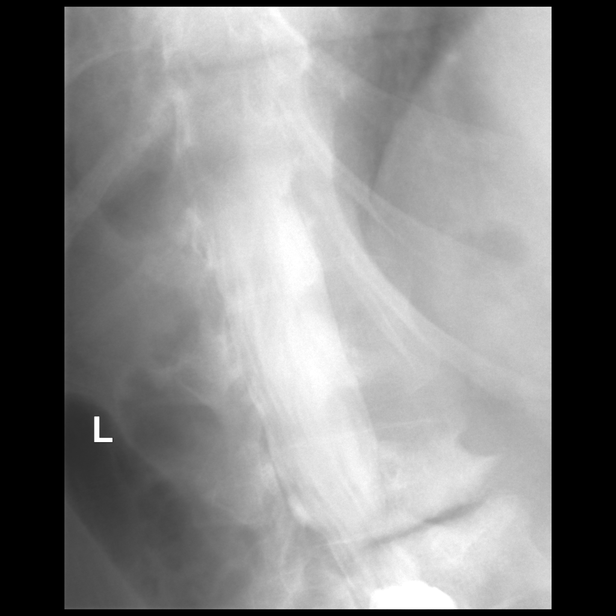

[Series 18: vasc standard · 1 of 1 slices shown (12 of 12)]
[im 1/1]
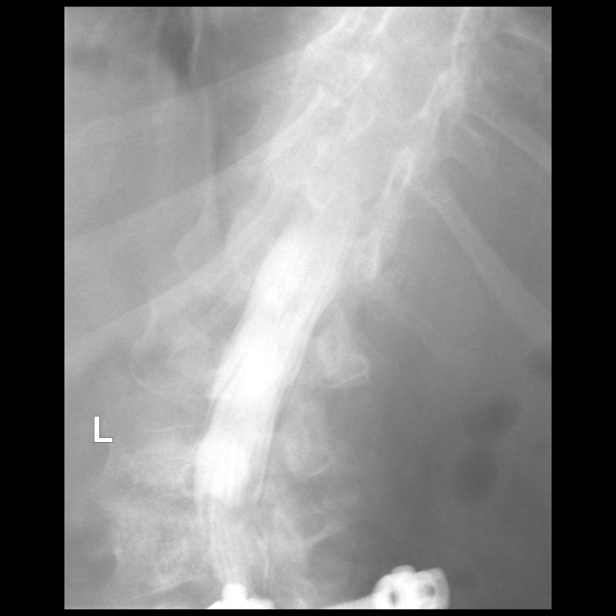

[14 of 21 positions shown; findings below may reference images not displayed]

FLUOROSCOPY:
2.3 minutes (31 mGy)

PROCEDURE:
LUMBAR MYELOGRAM

After thorough discussion of risks and benefits of the procedure
including bleeding, infection, injury to nerves, blood vessels,
adjacent structures as well as headache and CSF leak, written and
oral informed consent was obtained.

Patient was positioned prone on the fluoroscopy table. Local
anesthesia was provided with 1% lidocaine without epinephrine after
prepped and draped in the usual sterile fashion. Puncture was
performed at L4-L5 in the area of posterior decompression and
previous posterior fusion using a 20 gauge spinal needle via midline
interlaminar approach. Using a single pass through the dura, the
needle was placed within the thecal sac, with return of clear CSF.
15 mL of Isovue 2-5KK was injected into the thecal sac, with normal
opacification of the nerve roots and cauda equina consistent with
free flow within the subarachnoid space.

I personally performed the lumbar puncture and administered the
intrathecal contrast. I also personally supervised acquisition of
the myelogram images.
FINDINGS: After the administration of intrathecal contrast material, lumbar
myelogram was performed in the AP, lateral and bilateral oblique
projections. The patient is status post L4-L5 posterior fusion with
posterior decompression. There is free flow of contrast material
cranially. The nerve roots appear free-flowing. Advanced
degenerative changes are noted at L2-L3 with anterior indentation of
the thecal sac.
IMPRESSION: Lumbar puncture using fluoroscopy and lumbar myelogram as described.
Attention on CT lumbar myelogram.

## 2023-11-25 ENCOUNTER — Other Ambulatory Visit: Payer: Self-pay

## 2023-11-25 ENCOUNTER — Encounter: Payer: Self-pay | Admitting: Internal Medicine

## 2023-11-25 ENCOUNTER — Ambulatory Visit (INDEPENDENT_AMBULATORY_CARE_PROVIDER_SITE_OTHER): Payer: Medicare Other | Admitting: Internal Medicine

## 2023-11-25 VITALS — BP 124/81 | HR 92 | Resp 16 | Ht 61.0 in | Wt 130.0 lb

## 2023-11-25 DIAGNOSIS — R899 Unspecified abnormal finding in specimens from other organs, systems and tissues: Secondary | ICD-10-CM | POA: Diagnosis not present

## 2023-11-25 DIAGNOSIS — M069 Rheumatoid arthritis, unspecified: Secondary | ICD-10-CM

## 2023-11-25 NOTE — Patient Instructions (Addendum)
 You do not seem to have infection of the left knee  The bacteria that grew there previously appears to be contaminant   Stop antibiotics amoxicillin today    In the next 30 days if your knee is fairly stable then start the rheumatoid arthritis medication    Call my clinic if severe, progressive swelling/pain/redness or fever/chill occurs after you stop antibiotics   Otherwise no need to follow up with our clinic at this time

## 2023-11-25 NOTE — Progress Notes (Signed)
 [facility]  Reason for Consult:abnormal labs Referring Provider: ajizth ramachandran [facility])    Patient Active Problem List   Diagnosis Date Noted   Degenerative scoliosis in adult patient 05/28/2022   Spondylolisthesis of lumbar region 08/26/2016   Radius distal fracture 01/04/2016   Tinea unguium 02/23/2014   Insomnia 02/23/2014   Other and unspecified hyperlipidemia 02/23/2014   HTN (hypertension)       HPI: Alyssa Valentine is a 73 y.o. female referred here for abnormal knee fluid cx   She has been dx'ed with inflammatory/rheumatoid arthritis for a long time. She has a recent prednisone pulse. Never been on dmard  She has knee aspiration of the left in December twice and and first sample grew staph capitis. Both times wbc < 500 and lymphocytic predominant  She was given amoxicillin after the first knee aspiration cultures returned. She has 3 more days out of the couple weeks given  She had months of knee swelling before the first tap  Her knees are native  She never had fever, chill. She has not had symptomatology of septic arthritis (able to bear weight or perform active rom) outside of moderate knee pain  She is at baseline health    Review of Systems: ROS All other ros negative       Past Medical History:  Diagnosis Date   Anemia    many years ago   Anxiety    Arthritis    Depression    GERD (gastroesophageal reflux disease)    Glaucoma    Headache    hx of migraines, no longer have them   HTN (hypertension)     Social History   Tobacco Use   Smoking status: Never   Smokeless tobacco: Never  Vaping Use   Vaping status: Never Used  Substance Use Topics   Alcohol use: Yes    Comment: occasional   Drug use: No    History reviewed. No pertinent family history.  Allergies  Allergen Reactions   Sulfa Antibiotics Itching and Swelling    OBJECTIVE: Vitals:    11/25/23 1422  Resp: 16  Weight: 130 lb (59 kg)  Height: 5' 1 (1.549 m)   Body mass index is 24.56 kg/m.   Physical Exam General/constitutional: no distress, pleasant HEENT: Normocephalic, PER, Conj Clear, EOMI, Oropharynx clear Neck supple CV: rrr no mrg Lungs: clear to auscultation, normal respiratory effort Abd: Soft, Nontender Ext: no edema Skin: No Rash Neuro: nonfocal MSK: slight effusion left knee; no tenderness on palpation/passive/active rom; no erythema   Lab: See a&p  Microbiology:  Serology:  Imaging:   Assessment/plan: Problem List Items Addressed This Visit   None Visit Diagnoses       Rheumatoid arthritis, involving unspecified site, unspecified whether rheumatoid factor present (HCC)    -  Primary     Abnormal laboratory test           73 yo female hx ckd3, lumbar spinal fusion, GERD/PUD, inflammatory arthritis (suspect RA), referred from ?rheum for question of knee effusion inflammatory vs infectious     Left knee effusion Patient has been seeing dr Lequita Lange for presumed inflammatory arthritis. Bilateral knee pain with left knee effusion > right and s/p arthrocentesis twice First one 12/13 (223 wbc, 4% neutrophil) with staph capitis on culture (S oxacillin, cipro, gent, rifampin, bactrim, tetracycline); gram stain negative Second one 12/23 (139 wbc (10% neutrophil) sterile cultures Both times  crystal analysis negative  12/13 cbc 4/13/178; crp 36; sed rate 24; cr 1.2; lft 89/157/137/0.3  Clinically no sign of septic arthritis  Chronic nature is reassuring also against septic bacterial arthritis  She did receive amoxicillin prior to second tap but again not worrisome for septic arthritis  Staph capitis also would be an unusual bacteria in native knee septic arthritis  -would stop antibiotics today -if continue to do well within the next 30 days then ok to start DMARD -f/u as needed if fever, chill, persistent, increased left  knee swelling/redness/severe pain inability to bear weight       Follow-up: Return if symptoms worsen or fail to improve.  Constance ONEIDA Passer, MD [facility] 11/25/2023, 2:29 PM

## 2023-12-02 DIAGNOSIS — M25551 Pain in right hip: Secondary | ICD-10-CM | POA: Diagnosis not present

## 2023-12-02 DIAGNOSIS — N1831 Chronic kidney disease, stage 3a: Secondary | ICD-10-CM | POA: Diagnosis not present

## 2023-12-02 DIAGNOSIS — M0609 Rheumatoid arthritis without rheumatoid factor, multiple sites: Secondary | ICD-10-CM | POA: Diagnosis not present

## 2023-12-02 DIAGNOSIS — M25462 Effusion, left knee: Secondary | ICD-10-CM | POA: Diagnosis not present

## 2023-12-02 DIAGNOSIS — M1990 Unspecified osteoarthritis, unspecified site: Secondary | ICD-10-CM | POA: Diagnosis not present

## 2023-12-02 DIAGNOSIS — M2550 Pain in unspecified joint: Secondary | ICD-10-CM | POA: Diagnosis not present

## 2023-12-02 DIAGNOSIS — Z79899 Other long term (current) drug therapy: Secondary | ICD-10-CM | POA: Diagnosis not present

## 2023-12-22 DIAGNOSIS — M8589 Other specified disorders of bone density and structure, multiple sites: Secondary | ICD-10-CM | POA: Diagnosis not present

## 2023-12-22 DIAGNOSIS — Z79899 Other long term (current) drug therapy: Secondary | ICD-10-CM | POA: Diagnosis not present

## 2023-12-22 DIAGNOSIS — M25462 Effusion, left knee: Secondary | ICD-10-CM | POA: Diagnosis not present

## 2023-12-22 DIAGNOSIS — M0609 Rheumatoid arthritis without rheumatoid factor, multiple sites: Secondary | ICD-10-CM | POA: Diagnosis not present

## 2023-12-22 DIAGNOSIS — M1990 Unspecified osteoarthritis, unspecified site: Secondary | ICD-10-CM | POA: Diagnosis not present

## 2023-12-22 DIAGNOSIS — M25551 Pain in right hip: Secondary | ICD-10-CM | POA: Diagnosis not present

## 2023-12-22 DIAGNOSIS — M25552 Pain in left hip: Secondary | ICD-10-CM | POA: Diagnosis not present

## 2023-12-22 DIAGNOSIS — M2550 Pain in unspecified joint: Secondary | ICD-10-CM | POA: Diagnosis not present

## 2023-12-22 DIAGNOSIS — N1831 Chronic kidney disease, stage 3a: Secondary | ICD-10-CM | POA: Diagnosis not present

## 2024-01-06 DIAGNOSIS — M170 Bilateral primary osteoarthritis of knee: Secondary | ICD-10-CM | POA: Diagnosis not present

## 2024-01-18 DIAGNOSIS — H401131 Primary open-angle glaucoma, bilateral, mild stage: Secondary | ICD-10-CM | POA: Diagnosis not present

## 2024-01-20 DIAGNOSIS — H903 Sensorineural hearing loss, bilateral: Secondary | ICD-10-CM | POA: Diagnosis not present

## 2024-01-20 DIAGNOSIS — H6123 Impacted cerumen, bilateral: Secondary | ICD-10-CM | POA: Diagnosis not present

## 2024-01-21 DIAGNOSIS — E782 Mixed hyperlipidemia: Secondary | ICD-10-CM | POA: Diagnosis not present

## 2024-01-21 DIAGNOSIS — N182 Chronic kidney disease, stage 2 (mild): Secondary | ICD-10-CM | POA: Diagnosis not present

## 2024-01-21 DIAGNOSIS — D72819 Decreased white blood cell count, unspecified: Secondary | ICD-10-CM | POA: Diagnosis not present

## 2024-01-21 DIAGNOSIS — R7303 Prediabetes: Secondary | ICD-10-CM | POA: Diagnosis not present

## 2024-01-21 DIAGNOSIS — I10 Essential (primary) hypertension: Secondary | ICD-10-CM | POA: Diagnosis not present

## 2024-01-28 DIAGNOSIS — Z23 Encounter for immunization: Secondary | ICD-10-CM | POA: Diagnosis not present

## 2024-01-28 DIAGNOSIS — E782 Mixed hyperlipidemia: Secondary | ICD-10-CM | POA: Diagnosis not present

## 2024-01-28 DIAGNOSIS — I10 Essential (primary) hypertension: Secondary | ICD-10-CM | POA: Diagnosis not present

## 2024-01-28 DIAGNOSIS — R5383 Other fatigue: Secondary | ICD-10-CM | POA: Diagnosis not present

## 2024-01-28 DIAGNOSIS — N1831 Chronic kidney disease, stage 3a: Secondary | ICD-10-CM | POA: Diagnosis not present

## 2024-01-28 DIAGNOSIS — R7303 Prediabetes: Secondary | ICD-10-CM | POA: Diagnosis not present

## 2024-01-28 DIAGNOSIS — D72819 Decreased white blood cell count, unspecified: Secondary | ICD-10-CM | POA: Diagnosis not present

## 2024-02-01 DIAGNOSIS — R350 Frequency of micturition: Secondary | ICD-10-CM | POA: Diagnosis not present

## 2024-02-03 DIAGNOSIS — M25512 Pain in left shoulder: Secondary | ICD-10-CM | POA: Diagnosis not present

## 2024-02-16 DIAGNOSIS — T300 Burn of unspecified body region, unspecified degree: Secondary | ICD-10-CM | POA: Diagnosis not present

## 2024-02-16 DIAGNOSIS — T24219A Burn of second degree of unspecified thigh, initial encounter: Secondary | ICD-10-CM | POA: Diagnosis not present

## 2024-02-21 ENCOUNTER — Ambulatory Visit (HOSPITAL_BASED_OUTPATIENT_CLINIC_OR_DEPARTMENT_OTHER): Payer: Federal, State, Local not specified - PPO | Admitting: Cardiology

## 2024-03-03 DIAGNOSIS — T24209A Burn of second degree of unspecified site of unspecified lower limb, except ankle and foot, initial encounter: Secondary | ICD-10-CM | POA: Diagnosis not present

## 2024-03-03 DIAGNOSIS — M170 Bilateral primary osteoarthritis of knee: Secondary | ICD-10-CM | POA: Diagnosis not present

## 2024-03-15 DIAGNOSIS — T24212A Burn of second degree of left thigh, initial encounter: Secondary | ICD-10-CM | POA: Diagnosis not present

## 2024-03-15 DIAGNOSIS — L97122 Non-pressure chronic ulcer of left thigh with fat layer exposed: Secondary | ICD-10-CM | POA: Diagnosis not present

## 2024-03-15 DIAGNOSIS — M069 Rheumatoid arthritis, unspecified: Secondary | ICD-10-CM | POA: Diagnosis not present

## 2024-03-15 DIAGNOSIS — T24211A Burn of second degree of right thigh, initial encounter: Secondary | ICD-10-CM | POA: Diagnosis not present

## 2024-03-15 DIAGNOSIS — I96 Gangrene, not elsewhere classified: Secondary | ICD-10-CM | POA: Diagnosis not present

## 2024-03-15 DIAGNOSIS — L97112 Non-pressure chronic ulcer of right thigh with fat layer exposed: Secondary | ICD-10-CM | POA: Diagnosis not present

## 2024-03-22 DIAGNOSIS — L98492 Non-pressure chronic ulcer of skin of other sites with fat layer exposed: Secondary | ICD-10-CM | POA: Diagnosis not present

## 2024-03-22 DIAGNOSIS — M069 Rheumatoid arthritis, unspecified: Secondary | ICD-10-CM | POA: Diagnosis not present

## 2024-03-22 DIAGNOSIS — L97112 Non-pressure chronic ulcer of right thigh with fat layer exposed: Secondary | ICD-10-CM | POA: Diagnosis not present

## 2024-03-22 DIAGNOSIS — I96 Gangrene, not elsewhere classified: Secondary | ICD-10-CM | POA: Diagnosis not present

## 2024-03-22 DIAGNOSIS — L97122 Non-pressure chronic ulcer of left thigh with fat layer exposed: Secondary | ICD-10-CM | POA: Diagnosis not present

## 2024-03-29 DIAGNOSIS — L98492 Non-pressure chronic ulcer of skin of other sites with fat layer exposed: Secondary | ICD-10-CM | POA: Diagnosis not present

## 2024-03-29 DIAGNOSIS — I96 Gangrene, not elsewhere classified: Secondary | ICD-10-CM | POA: Diagnosis not present

## 2024-03-29 DIAGNOSIS — L97112 Non-pressure chronic ulcer of right thigh with fat layer exposed: Secondary | ICD-10-CM | POA: Diagnosis not present

## 2024-03-29 DIAGNOSIS — L97122 Non-pressure chronic ulcer of left thigh with fat layer exposed: Secondary | ICD-10-CM | POA: Diagnosis not present

## 2024-03-29 DIAGNOSIS — M069 Rheumatoid arthritis, unspecified: Secondary | ICD-10-CM | POA: Diagnosis not present

## 2024-04-05 DIAGNOSIS — L97112 Non-pressure chronic ulcer of right thigh with fat layer exposed: Secondary | ICD-10-CM | POA: Diagnosis not present

## 2024-04-05 DIAGNOSIS — I96 Gangrene, not elsewhere classified: Secondary | ICD-10-CM | POA: Diagnosis not present

## 2024-04-05 DIAGNOSIS — M069 Rheumatoid arthritis, unspecified: Secondary | ICD-10-CM | POA: Diagnosis not present

## 2024-04-05 DIAGNOSIS — L97122 Non-pressure chronic ulcer of left thigh with fat layer exposed: Secondary | ICD-10-CM | POA: Diagnosis not present

## 2024-04-05 DIAGNOSIS — L98492 Non-pressure chronic ulcer of skin of other sites with fat layer exposed: Secondary | ICD-10-CM | POA: Diagnosis not present

## 2024-04-11 DIAGNOSIS — R350 Frequency of micturition: Secondary | ICD-10-CM | POA: Diagnosis not present

## 2024-04-12 DIAGNOSIS — L97122 Non-pressure chronic ulcer of left thigh with fat layer exposed: Secondary | ICD-10-CM | POA: Diagnosis not present

## 2024-04-12 DIAGNOSIS — I96 Gangrene, not elsewhere classified: Secondary | ICD-10-CM | POA: Diagnosis not present

## 2024-04-12 DIAGNOSIS — M069 Rheumatoid arthritis, unspecified: Secondary | ICD-10-CM | POA: Diagnosis not present

## 2024-04-12 DIAGNOSIS — L98492 Non-pressure chronic ulcer of skin of other sites with fat layer exposed: Secondary | ICD-10-CM | POA: Diagnosis not present

## 2024-04-12 DIAGNOSIS — L97112 Non-pressure chronic ulcer of right thigh with fat layer exposed: Secondary | ICD-10-CM | POA: Diagnosis not present

## 2024-04-26 DIAGNOSIS — L97112 Non-pressure chronic ulcer of right thigh with fat layer exposed: Secondary | ICD-10-CM | POA: Diagnosis not present

## 2024-04-26 DIAGNOSIS — T24211D Burn of second degree of right thigh, subsequent encounter: Secondary | ICD-10-CM | POA: Diagnosis not present

## 2024-04-26 DIAGNOSIS — T24311D Burn of third degree of right thigh, subsequent encounter: Secondary | ICD-10-CM | POA: Diagnosis not present

## 2024-04-26 DIAGNOSIS — T24212D Burn of second degree of left thigh, subsequent encounter: Secondary | ICD-10-CM | POA: Diagnosis not present

## 2024-05-10 DIAGNOSIS — L98492 Non-pressure chronic ulcer of skin of other sites with fat layer exposed: Secondary | ICD-10-CM | POA: Diagnosis not present

## 2024-05-10 DIAGNOSIS — T24311D Burn of third degree of right thigh, subsequent encounter: Secondary | ICD-10-CM | POA: Diagnosis not present

## 2024-05-10 DIAGNOSIS — L97112 Non-pressure chronic ulcer of right thigh with fat layer exposed: Secondary | ICD-10-CM | POA: Diagnosis not present

## 2024-05-23 DIAGNOSIS — M75122 Complete rotator cuff tear or rupture of left shoulder, not specified as traumatic: Secondary | ICD-10-CM | POA: Diagnosis not present

## 2024-05-23 DIAGNOSIS — M19012 Primary osteoarthritis, left shoulder: Secondary | ICD-10-CM | POA: Diagnosis not present

## 2024-05-23 DIAGNOSIS — M19111 Post-traumatic osteoarthritis, right shoulder: Secondary | ICD-10-CM | POA: Diagnosis not present

## 2024-06-02 DIAGNOSIS — M1712 Unilateral primary osteoarthritis, left knee: Secondary | ICD-10-CM | POA: Diagnosis not present

## 2024-06-14 DIAGNOSIS — R350 Frequency of micturition: Secondary | ICD-10-CM | POA: Diagnosis not present

## 2024-06-20 ENCOUNTER — Telehealth: Payer: Self-pay

## 2024-06-20 ENCOUNTER — Other Ambulatory Visit (INDEPENDENT_AMBULATORY_CARE_PROVIDER_SITE_OTHER)

## 2024-06-20 ENCOUNTER — Ambulatory Visit: Admitting: Orthopaedic Surgery

## 2024-06-20 ENCOUNTER — Encounter: Payer: Self-pay | Admitting: Orthopaedic Surgery

## 2024-06-20 DIAGNOSIS — M1611 Unilateral primary osteoarthritis, right hip: Secondary | ICD-10-CM | POA: Diagnosis not present
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
## 2024-06-20 NOTE — Telephone Encounter (Signed)
 Patient given surgical clearance form for PCP. Aware that we must receive clearance form back before being able to proceed with scheduling surgery.

## 2024-06-23 ENCOUNTER — Encounter (HOSPITAL_BASED_OUTPATIENT_CLINIC_OR_DEPARTMENT_OTHER): Payer: Self-pay | Admitting: Cardiology

## 2024-06-23 ENCOUNTER — Ambulatory Visit (HOSPITAL_BASED_OUTPATIENT_CLINIC_OR_DEPARTMENT_OTHER): Admitting: Cardiology

## 2024-06-23 VITALS — BP 130/70 | HR 88 | Ht 61.0 in | Wt 119.2 lb

## 2024-06-23 DIAGNOSIS — Z01810 Encounter for preprocedural cardiovascular examination: Secondary | ICD-10-CM

## 2024-06-23 DIAGNOSIS — I10 Essential (primary) hypertension: Secondary | ICD-10-CM | POA: Diagnosis not present

## 2024-06-23 DIAGNOSIS — Z7189 Other specified counseling: Secondary | ICD-10-CM | POA: Diagnosis not present

## 2024-06-23 DIAGNOSIS — Z8249 Family history of ischemic heart disease and other diseases of the circulatory system: Secondary | ICD-10-CM

## 2024-06-23 DIAGNOSIS — E7800 Pure hypercholesterolemia, unspecified: Secondary | ICD-10-CM | POA: Diagnosis not present

## 2024-06-23 NOTE — Patient Instructions (Signed)
 Medication Instructions:  Your physician recommends that you continue on your current medications as directed. Please refer to the Current Medication list given to you today. *If you need a refill on your cardiac medications before your next appointment, please call your pharmacy*  Lab Work: None Ordered If you have labs (blood work) drawn today and your tests are completely normal, you will receive your results only by: MyChart Message (if you have MyChart) OR A paper copy in the mail If you have any lab test that is abnormal or we need to change your treatment, we will call you to review the results.  Testing/Procedures: None ordered  Follow-Up: At [facility], you and your health needs are our priority.  As part of our continuing mission to provide you with exceptional heart care, our providers are all part of one team.  This team includes your primary Cardiologist (physician) and Advanced Practice Providers or APPs (Physician Assistants and Nurse Practitioners) who all work together to provide you with the care you need, when you need it.  Your next appointment:   AS NEEDED   Provider:   Shelda Bruckner, MD    We recommend signing up for the patient portal called MyChart.  Sign up information is provided on this After Visit Summary.  MyChart is used to connect with patients for Virtual Visits (Telemedicine).  Patients are able to view lab/test results, encounter notes, upcoming appointments, etc.  Non-urgent messages can be sent to your provider as well.   To learn more about what you can do with MyChart, go to [email or web address].   Other Instructions

## 2024-06-23 NOTE — Progress Notes (Signed)
 Cardiology Office Note:  .   Date:  06/23/2024  ID:  Alyssa Valentine, DOB 1951/06/06, MRN [id number] PCP: Verdia Lombard, MD  [facility] Providers Cardiologist:  Shelda Bruckner, MD {  History of Present Illness: Alyssa Valentine is a 73 y.o. female with PMH hypertension, hyperlipidemia, family history of heart disease. I met her in 08/2020 but had not seen her again until visit 06/23/24.  Pertinent CV history: Family history: Brother with two Mis, earliest in his 35s. No other MI or stroke that she knows up.   Today: Feels terrible, but not from her heart. Has multiple joint issues, pending surgeries, starting with R hip replacement. Has appt with her PCP for clearance for this coming up. She does not think she needs cardiology clearance (we have not received any notice of this); ortho notes say PCP clearance, but we discussed today. Not having any active cardiac symptoms. No significant cardiac history. I saw her in 2021 for risk factor discussion, and those risks are well managed at this time.   Hip and knee severely limit her activity. She has to go up stairs very slowly and sometimes has to scoot up backwards. Walks with cane or walker. Severely limited in terms of housework.   No new family members with heart disease.  ROS: Denies chest pain, shortness of breath at rest or with normal exertion. No PND, orthopnea, LE edema or unexpected weight gain. No syncope or palpitations. ROS otherwise negative except as noted.   Studies Reviewed: SABRA    EKG:  EKG Interpretation Date/Time:  Friday June 23 2024 15:34:13 EDT Ventricular Rate:  92 PR Interval:  144 QRS Duration:  76 QT Interval:  356 QTC Calculation: 440 R Axis:   -29  Text Interpretation: Normal sinus rhythm Right atrial enlargement Possible Anterolateral infarct , age undetermined Confirmed by Bruckner Shelda [phone]) on 06/23/2024 3:53:00 PM    Physical Exam:   VS:  BP 130/70   Pulse 88   Ht 5'  1 (1.549 m)   Wt 119 lb 3.2 oz (54.1 kg)   SpO2 97%   BMI 22.52 kg/m    Wt Readings from Last 3 Encounters:  06/23/24 119 lb 3.2 oz (54.1 kg)  11/25/23 130 lb (59 kg)  08/14/23 142 lb (64.4 kg)    GEN: Well nourished, well developed in no acute distress HEENT: Normal, moist mucous membranes NECK: No JVD CARDIAC: regular rhythm, normal S1 and S2, no rubs or gallops. No murmur. VASCULAR: Radial and DP pulses 2+ bilaterally. No carotid bruits RESPIRATORY:  Clear to auscultation without rales, wheezing or rhonchi  ABDOMEN: Soft, non-tender, non-distended MUSCULOSKELETAL:  Ambulates independently SKIN: Warm and dry, no edema NEUROLOGIC:  Alert and oriented x 3. No focal neuro deficits noted. PSYCHIATRIC:  Normal affect    ASSESSMENT AND PLAN: .    Preoperative cardiovascular exam No active cardiac issues, no history of ASCVD (we have discussed prevention, no known disease). Limited by joint pain but was performing >4 mets before pain worsened, still intermittently climbs stairs. Acceptable risk from cardiac perspective, no further testing indicated.  Family history of heart disease Hyperlipidemia -continue atorvastatin   Hypertension -at goal, continue hydrochlorothiazide  and valsartan  CV risk counseling and prevention -recommend heart healthy/Mediterranean diet, with whole grains, fruits, vegetable, fish, lean meats, nuts, and olive oil. Limit salt. -recommend moderate walking, 3-5 times/week for 30-50 minutes each session. Aim for at least 150 minutes.week. Goal should be pace of 3 miles/hours, or walking  1.5 miles in 30 minutes -recommend avoidance of tobacco products. Avoid excess alcohol.  Dispo: I would be happy to see her back as needed  Signed, Shelda Bruckner, MD   Shelda Bruckner, MD, PhD, [facility]  [facility]  [place]  [facility] at [facility] at [facility] [address], [address] [place], [place]  [address] [phone]

## 2024-06-29 DIAGNOSIS — Z01818 Encounter for other preprocedural examination: Secondary | ICD-10-CM | POA: Diagnosis not present

## 2024-06-29 DIAGNOSIS — E782 Mixed hyperlipidemia: Secondary | ICD-10-CM | POA: Diagnosis not present

## 2024-06-29 DIAGNOSIS — I10 Essential (primary) hypertension: Secondary | ICD-10-CM | POA: Diagnosis not present

## 2024-06-29 DIAGNOSIS — N182 Chronic kidney disease, stage 2 (mild): Secondary | ICD-10-CM | POA: Diagnosis not present

## 2024-06-29 DIAGNOSIS — R7303 Prediabetes: Secondary | ICD-10-CM | POA: Diagnosis not present

## 2024-06-29 DIAGNOSIS — D72819 Decreased white blood cell count, unspecified: Secondary | ICD-10-CM | POA: Diagnosis not present

## 2024-07-12 ENCOUNTER — Telehealth: Payer: Self-pay | Admitting: Orthopaedic Surgery

## 2024-07-12 NOTE — Telephone Encounter (Signed)
 Patient called and left voicemail message to call her.  Returned call and left my name and direct extension.

## 2024-07-13 ENCOUNTER — Telehealth: Payer: Self-pay | Admitting: Orthopaedic Surgery

## 2024-07-13 NOTE — Telephone Encounter (Signed)
 I don't see any xrays or records on her left knee so we would need to evaluate her first.

## 2024-07-13 NOTE — Telephone Encounter (Signed)
 Patient calling to discuss surgery. Hip x-rays were done the day of her visit on 06-20-24, however she is asking if she can have left total knee surgery before having the hip. She has been cleared by PCP and cardiology. Please advise or provide surgery sheet.

## 2024-07-14 DIAGNOSIS — H903 Sensorineural hearing loss, bilateral: Secondary | ICD-10-CM | POA: Diagnosis not present

## 2024-07-14 DIAGNOSIS — H9313 Tinnitus, bilateral: Secondary | ICD-10-CM | POA: Diagnosis not present

## 2024-07-14 DIAGNOSIS — H6123 Impacted cerumen, bilateral: Secondary | ICD-10-CM | POA: Diagnosis not present

## 2024-07-14 NOTE — Telephone Encounter (Signed)
 Yes she needs to have an appointment for documentation purposes.

## 2024-07-21 ENCOUNTER — Other Ambulatory Visit (INDEPENDENT_AMBULATORY_CARE_PROVIDER_SITE_OTHER)

## 2024-07-21 ENCOUNTER — Ambulatory Visit: Admitting: Orthopaedic Surgery

## 2024-07-21 DIAGNOSIS — G8929 Other chronic pain: Secondary | ICD-10-CM | POA: Diagnosis not present

## 2024-07-21 DIAGNOSIS — M25562 Pain in left knee: Secondary | ICD-10-CM | POA: Diagnosis not present

## 2024-07-21 DIAGNOSIS — M1712 Unilateral primary osteoarthritis, left knee: Secondary | ICD-10-CM

## 2024-07-21 NOTE — Progress Notes (Signed)
 Office Visit Note   Patient: Alyssa Valentine           Date of Birth: [birth year]           MRN: [id number] Visit Date: 07/21/2024              Requested by: Verdia Lombard, MD [address] [place],  [place] [address] PCP: Verdia Lombard, MD   Assessment & Plan: Visit Diagnoses:  1. Primary osteoarthritis of left knee     Plan: History of Present Illness Alyssa Valentine is a 73 year old female who presents for evaluation for left knee replacement surgery.  She experiences severe left knee pain that affects her ability to bear weight and has progressively worsened, prompting the decision for knee replacement surgery. She has no allergies to nickel, no history of blood clots, and does not smoke or consume alcohol.  Physical Exam MUSCULOSKELETAL: Left knee flexion approximately 100 degrees with pain and valgus deformity.  Minimal flexion contracture.   SKIN: Burns on skin healed.  Assessment and Plan Left knee osteoarthritis with pain and flexion contracture Impression is severe left knee degenerative joint disease secondary to Osteoarthritis.  Patient has attempted conservative treatment for at least 6 consecutive weeks within the past 12 weeks, including but not limited to physical therapy, home exercise program, NSAIDs, activity modification, and/or corticosteroid injections. Despite these efforts, symptoms have not improved or have worsened. Conservative measures have been deemed unsuccessful at this time. After a detailed discussion covering diagnosis and treatment options--including the risks, benefits, alternatives, and potential complications of surgical and nonsurgical management--the patient elected to proceed with surgery  Anticoagulants: No antithrombotic Postop anticoagulation: Eliquis Diabetic: No  Nickel allergy: No Prior DVT/PE: No Tobacco use: No Clearances needed for surgery: obtained Anticipated discharge dispo: Home   Follow-Up  Instructions: No follow-ups on file.   Orders:  Orders Placed This Encounter  Procedures   XR KNEE 3 VIEW LEFT   No orders of the defined types were placed in this encounter.     Procedures: No procedures performed   Clinical Data: No additional findings.   Subjective: Chief Complaint  Patient presents with   Left Knee - Pain    HPI  Review of Systems  Constitutional: Negative.   HENT: Negative.    Eyes: Negative.   Respiratory: Negative.    Cardiovascular: Negative.   Endocrine: Negative.   Musculoskeletal: Negative.   Neurological: Negative.   Hematological: Negative.   Psychiatric/Behavioral: Negative.    All other systems reviewed and are negative.    Objective: Vital Signs: There were no vitals taken for this visit.  Physical Exam Vitals and nursing note reviewed.  Constitutional:      Appearance: She is well-developed.  HENT:     Head: Atraumatic.     Nose: Nose normal.  Eyes:     Extraocular Movements: Extraocular movements intact.  Cardiovascular:     Pulses: Normal pulses.  Pulmonary:     Effort: Pulmonary effort is normal.  Abdominal:     Palpations: Abdomen is soft.  Musculoskeletal:     Cervical back: Neck supple.  Skin:    General: Skin is warm.     Capillary Refill: Capillary refill takes less than 2 seconds.  Neurological:     Mental Status: She is alert. Mental status is at baseline.  Psychiatric:        Behavior: Behavior normal.        Thought Content: Thought content normal.  Judgment: Judgment normal.     Ortho Exam  Specialty Comments:  No specialty comments available.  Imaging: XR KNEE 3 VIEW LEFT Result Date: 07/21/2024 X-rays of the left knee show advanced tricompartmental osteoarthritis.  Bone-on-bone joint space narrowing.  Kellgren-Lawrence stage IV.  Valgus deformity.    PMFS History: Patient Active Problem List   Diagnosis Date Noted   Primary osteoarthritis of right hip 06/20/2024    Degenerative scoliosis in adult patient 05/28/2022   Spondylolisthesis of lumbar region 08/26/2016   Radius distal fracture 01/04/2016   Tinea unguium 02/23/2014   Insomnia 02/23/2014   Other and unspecified hyperlipidemia 02/23/2014   HTN (hypertension)    Past Medical History:  Diagnosis Date   Anemia    many years ago   Anxiety    Arthritis    Depression    GERD (gastroesophageal reflux disease)    Glaucoma    Headache    hx of migraines, no longer have them   HTN (hypertension)     No family history on file.  Past Surgical History:  Procedure Laterality Date   BACK SURGERY  2017   CARPAL TUNNEL RELEASE Right 07/17/1989   CARPAL TUNNEL RELEASE Left 01/04/2016   Procedure: CARPAL TUNNEL RELEASE;  Surgeon: Elsie Mussel, MD;  Location: [facility];  Service: Orthopedics;  Laterality: Left;   CESAREAN SECTION  11/17/1983   COLONOSCOPY N/A 09/15/2013   Procedure: COLONOSCOPY;  Surgeon: Belvie JONETTA Just, MD;  Location: [facility];  Service: Endoscopy;  Laterality: N/A;   ESOPHAGOGASTRODUODENOSCOPY N/A 09/15/2013   Procedure: ESOPHAGOGASTRODUODENOSCOPY (EGD);  Surgeon: Belvie JONETTA Just, MD;  Location: [facility];  Service: Endoscopy;  Laterality: N/A;   ORIF RADIAL FRACTURE Left 01/04/2016   Procedure: OPEN REDUCTION INTERNAL FIXATION (ORIF) LEFT RADIAL FRACTURE;  Surgeon: Elsie Mussel, MD;  [facility];  Service: Orthopedics;  Laterality: Left;   ROTATOR CUFF REPAIR Bilateral 2010 and 2013   Social History   Occupational History   Not on file  Tobacco Use   Smoking status: Never   Smokeless tobacco: Never  Vaping Use   Vaping status: Never Used  Substance and Sexual Activity   Alcohol use: Yes    Comment: occasional   Drug use: No   Sexual activity: Not on file

## 2024-07-28 ENCOUNTER — Other Ambulatory Visit: Payer: Self-pay | Admitting: Physician Assistant

## 2024-07-28 DIAGNOSIS — E782 Mixed hyperlipidemia: Secondary | ICD-10-CM | POA: Diagnosis not present

## 2024-07-28 DIAGNOSIS — I10 Essential (primary) hypertension: Secondary | ICD-10-CM | POA: Diagnosis not present

## 2024-07-28 DIAGNOSIS — D72819 Decreased white blood cell count, unspecified: Secondary | ICD-10-CM | POA: Diagnosis not present

## 2024-07-28 DIAGNOSIS — R7303 Prediabetes: Secondary | ICD-10-CM | POA: Diagnosis not present

## 2024-07-28 MED ORDER — ONDANSETRON HCL 4 MG PO TABS: 4.0000 mg | ORAL_TABLET | Freq: Three times a day (TID) | ORAL | 0 refills | Status: AC | PRN

## 2024-07-28 MED ORDER — METHOCARBAMOL 750 MG PO TABS: 750.0000 mg | ORAL_TABLET | Freq: Three times a day (TID) | ORAL | 2 refills | Status: AC | PRN

## 2024-07-28 MED ORDER — DOCUSATE SODIUM 100 MG PO CAPS: 100.0000 mg | ORAL_CAPSULE | Freq: Every day | ORAL | 2 refills | Status: DC | PRN

## 2024-07-28 MED ORDER — OXYCODONE-ACETAMINOPHEN 5-325 MG PO TABS: 1.0000 | ORAL_TABLET | Freq: Four times a day (QID) | ORAL | 0 refills | Status: DC | PRN

## 2024-08-01 DIAGNOSIS — Z6823 Body mass index (BMI) 23.0-23.9, adult: Secondary | ICD-10-CM | POA: Diagnosis not present

## 2024-08-01 DIAGNOSIS — Z1231 Encounter for screening mammogram for malignant neoplasm of breast: Secondary | ICD-10-CM | POA: Diagnosis not present

## 2024-08-02 NOTE — Progress Notes (Signed)
 Surgical Instructions   Your procedure is scheduled on Monday August 07, 2024. Report to [facility] Main Entrance A at 5:30 A.M., then check in with the Admitting office. Any questions or running late day of surgery: call [phone]  Questions prior to your surgery date: call [phone], Monday-Friday, 8am-4pm. If you experience any cold or flu symptoms such as cough, fever, chills, shortness of breath, etc. between now and your scheduled surgery, please notify us  at the above number.     Remember:  Do not eat after midnight the night before your surgery  You may drink clear liquids until 4:30 the morning of your surgery.   Clear liquids allowed are: Water, Non-Citrus Juices (without pulp), Carbonated Beverages, Clear Tea (no milk, honey, etc.), Black Coffee Only (NO MILK, CREAM OR POWDERED CREAMER of any kind), and Gatorade.  Patient Instructions  The night before surgery:  No food after midnight. ONLY clear liquids after midnight  The day of surgery (if you do NOT have diabetes):  Drink ONE (1) Pre-Surgery Clear Ensure by 4:30 the morning of surgery. Drink in one sitting. Do not sip.  This drink was given to you during your hospital  pre-op appointment visit.  Nothing else to drink after completing the  Pre-Surgery Clear Ensure.         If you have questions, please contact your surgeon's office.   Take these medicines the morning of surgery with A SIP OF WATER  amLODipine (NORVASC)  omeprazole (PRILOSEC)  sertraline  (ZOLOFT )   May take these medicines IF NEEDED: acetaminophen  (TYLENOL )  methocarbamol  (ROBAXIN )  ondansetron  (ZOFRAN )  tiZANidine (ZANAFLEX)  triamcinolone  (NASACORT ALLERGY 24HR) 55 MCG/ACT AERO nasal inhaler    One week prior to surgery, STOP taking any Aspirin (unless otherwise instructed by your surgeon) Aleve, Naproxen, Ibuprofen, Motrin, Advil, Goody's, BC's, all herbal medications, fish oil, and non-prescription vitamins. This includes your  diclofenac Sodium (VOLTAREN) 1 % GEL.                       Do NOT Smoke (Tobacco/Vaping) for 24 hours prior to your procedure.  If you use a CPAP at night, you may bring your mask/headgear for your overnight stay.   You will be asked to remove any contacts, glasses, piercing's, hearing aid's, dentures/partials prior to surgery. Please bring cases for these items if needed.    Patients discharged the day of surgery will not be allowed to drive home, and someone needs to stay with them for 24 hours.  SURGICAL WAITING ROOM VISITATION Patients may have no more than 2 support people in the waiting area - these visitors may rotate.   Pre-op nurse will coordinate an appropriate time for 1 ADULT support person, who may not rotate, to accompany patient in pre-op.  Children under the age of 3 must have an adult with them who is not the patient and must remain in the main waiting area with an adult.  If the patient needs to stay at the hospital during part of their recovery, the visitor guidelines for inpatient rooms apply.  Please refer to the [facility] website for the visitor guidelines for any additional information.   If you received a COVID test during your pre-op visit  it is requested that you wear a mask when out in public, stay away from anyone that may not be feeling well and notify your surgeon if you develop symptoms. If you have been in contact with anyone that has tested positive  in the last 10 days please notify you surgeon.      Pre-operative 5 CHG Bathing Instructions   You can play a key role in reducing the risk of infection after surgery. Your skin needs to be as free of germs as possible. You can reduce the number of germs on your skin by washing with CHG (chlorhexidine  gluconate) soap before surgery. CHG is an antiseptic soap that kills germs and continues to kill germs even after washing.   DO NOT use if you have an allergy to chlorhexidine /CHG or antibacterial soaps. If  your skin becomes reddened or irritated, stop using the CHG and notify one of our RNs at [phone].   Please shower with the CHG soap starting 4 days before surgery using the following schedule:     Please keep in mind the following:  DO NOT shave, including legs and underarms, starting the day of your first shower.   Place clean sheets on your bed the day you start using CHG soap. Use a clean washcloth (not used since being washed) for each shower. DO NOT sleep with pets once you start using the CHG.   CHG Shower Instructions:  Wash your face and private area with normal soap. If you choose to wash your hair, wash first with your normal shampoo.  After you use shampoo/soap, rinse your hair and body thoroughly to remove shampoo/soap residue.  Turn the water OFF and apply about 3 tablespoons (45 ml) of CHG soap to a CLEAN washcloth.  Apply CHG soap ONLY FROM YOUR NECK DOWN TO YOUR TOES (washing for 3-5 minutes)  DO NOT use CHG soap on face, private areas, open wounds, or sores.  Pay special attention to the area where your surgery is being performed.  If you are having back surgery, having someone wash your back for you may be helpful. Wait 2 minutes after CHG soap is applied, then you may rinse off the CHG soap.  Pat dry with a clean towel  Put on clean clothes/pajamas   If you choose to wear lotion, please use ONLY the CHG-compatible lotions that are listed below.  Additional instructions for the day of surgery: DO NOT APPLY any lotions, deodorants or perfumes.   Do not bring valuables to the hospital. [facility] is not responsible for any belongings/valuables. Do not wear nail polish, gel polish, artificial nails, or any other type of covering on natural nails (fingers and toes) Do not wear jewelry or makeup Put on clean/comfortable clothes.  Please brush your teeth.  Ask your nurse before applying any prescription medications to the skin.     CHG Compatible Lotions    Aveeno Moisturizing lotion  Cetaphil Moisturizing Cream  Cetaphil Moisturizing Lotion  Clairol Herbal Essence Moisturizing Lotion, Dry Skin  Clairol Herbal Essence Moisturizing Lotion, Extra Dry Skin  Clairol Herbal Essence Moisturizing Lotion, Normal Skin  Curel Age Defying Therapeutic Moisturizing Lotion with Alpha Hydroxy  Curel Extreme Care Body Lotion  Curel Soothing Hands Moisturizing Hand Lotion  Curel Therapeutic Moisturizing Cream, Fragrance-Free  Curel Therapeutic Moisturizing Lotion, Fragrance-Free  Curel Therapeutic Moisturizing Lotion, Original Formula  Eucerin Daily Replenishing Lotion  Eucerin Dry Skin Therapy Plus Alpha Hydroxy Crme  Eucerin Dry Skin Therapy Plus Alpha Hydroxy Lotion  Eucerin Original Crme  Eucerin Original Lotion  Eucerin Plus Crme Eucerin Plus Lotion  Eucerin TriLipid Replenishing Lotion  Keri Anti-Bacterial Hand Lotion  Keri Deep Conditioning Original Lotion Dry Skin Formula Softly Scented  Keri Deep Conditioning Original Lotion, Fragrance Free  Sensitive Skin Formula  Keri Lotion Fast Absorbing Fragrance Free Sensitive Skin Formula  Keri Lotion Fast Absorbing Softly Scented Dry Skin Formula  Keri Original Lotion  Keri Skin Renewal Lotion Keri Silky Smooth Lotion  Keri Silky Smooth Sensitive Skin Lotion  Nivea Body Creamy Conditioning Oil  Nivea Body Extra Enriched Lotion  Nivea Body Original Lotion  Nivea Body Sheer Moisturizing Lotion Nivea Crme  Nivea Skin Firming Lotion  NutraDerm 30 Skin Lotion  NutraDerm Skin Lotion  NutraDerm Therapeutic Skin Cream  NutraDerm Therapeutic Skin Lotion  ProShield Protective Hand Cream  Provon moisturizing lotion  Please read over the following fact sheets that you were given.

## 2024-08-03 ENCOUNTER — Encounter (HOSPITAL_COMMUNITY): Payer: Self-pay

## 2024-08-03 ENCOUNTER — Other Ambulatory Visit: Payer: Self-pay

## 2024-08-03 ENCOUNTER — Encounter (HOSPITAL_COMMUNITY)
Admission: RE | Admit: 2024-08-03 | Discharge: 2024-08-03 | Disposition: A | Discharge status: Home / Self Care | Source: Ambulatory Visit | Attending: Orthopaedic Surgery | Admitting: Orthopaedic Surgery

## 2024-08-03 VITALS — BP 149/88 | HR 87 | Temp 97.9°F | Resp 16 | Ht 59.0 in | Wt 115.9 lb

## 2024-08-03 DIAGNOSIS — M1712 Unilateral primary osteoarthritis, left knee: Secondary | ICD-10-CM | POA: Diagnosis not present

## 2024-08-03 DIAGNOSIS — K219 Gastro-esophageal reflux disease without esophagitis: Secondary | ICD-10-CM | POA: Insufficient documentation

## 2024-08-03 DIAGNOSIS — Z01812 Encounter for preprocedural laboratory examination: Secondary | ICD-10-CM | POA: Diagnosis not present

## 2024-08-03 DIAGNOSIS — H409 Unspecified glaucoma: Secondary | ICD-10-CM | POA: Diagnosis not present

## 2024-08-03 DIAGNOSIS — I10 Essential (primary) hypertension: Secondary | ICD-10-CM | POA: Insufficient documentation

## 2024-08-03 DIAGNOSIS — Z01818 Encounter for other preprocedural examination: Secondary | ICD-10-CM

## 2024-08-03 DIAGNOSIS — M1612 Unilateral primary osteoarthritis, left hip: Secondary | ICD-10-CM

## 2024-08-03 LAB — CBC
HCT: 32.8 % — ABNORMAL LOW (ref 36.0–46.0)
Hemoglobin: 10.9 g/dL — ABNORMAL LOW (ref 12.0–15.0)
MCH: 27.3 pg (ref 26.0–34.0)
MCHC: 33.2 g/dL (ref 30.0–36.0)
MCV: 82.2 fL (ref 80.0–100.0)
Platelets: 173 K/uL (ref 150–400)
RBC: 3.99 MIL/uL (ref 3.87–5.11)
RDW: 17.1 % — ABNORMAL HIGH (ref 11.5–15.5)
WBC: 3.4 K/uL — ABNORMAL LOW (ref 4.0–10.5)
nRBC: 0 % (ref 0.0–0.2)

## 2024-08-03 LAB — BASIC METABOLIC PANEL WITH GFR
Anion gap: 8 (ref 5–15)
BUN: 19 mg/dL (ref 8–23)
CO2: 25 mmol/L (ref 22–32)
Calcium: 9.4 mg/dL (ref 8.9–10.3)
Chloride: 106 mmol/L (ref 98–111)
Creatinine, Ser: 0.9 mg/dL (ref 0.44–1.00)
GFR, Estimated: >60 mL/min (ref >60)
Glucose, Bld: 95 mg/dL (ref 70–99)
Potassium: 3.7 mmol/L (ref 3.5–5.1)
Sodium: 139 mmol/L (ref 135–145)

## 2024-08-03 LAB — SURGICAL PCR SCREEN
MRSA, PCR: NEGATIVE
Staphylococcus aureus: NEGATIVE

## 2024-08-03 NOTE — Progress Notes (Signed)
 PCP - Verdia Lombard, MD  Cardiologist - Shelda Bruckner, MD   PPM/ICD - denies Device Orders - n/a Rep Notified - n/a  Chest x-ray - denies EKG - 06-23-24 Stress Test - many many years ago per patient ECHO - denies Cardiac Cath - denies  Sleep Study - denies CPAP - n/a  DM -denies  Blood Thinner Instructions:denies Aspirin Instructions:denies  ERAS Protcol -clear liquids until 4:30 am. PRE-SURGERY Ensure  COVID TEST- n/a   Anesthesia review: yes HTN and clearance  Patient denies shortness of breath, fever, cough and chest pain at PAT appointment   All instructions explained to the patient, with a verbal understanding of the material. Patient agrees to go over the instructions while at home for a better understanding. Patient also instructed to self quarantine after being tested for COVID-19. The opportunity to ask questions was provided.

## 2024-08-04 DIAGNOSIS — R634 Abnormal weight loss: Secondary | ICD-10-CM | POA: Diagnosis not present

## 2024-08-04 DIAGNOSIS — I10 Essential (primary) hypertension: Secondary | ICD-10-CM | POA: Diagnosis not present

## 2024-08-04 DIAGNOSIS — E782 Mixed hyperlipidemia: Secondary | ICD-10-CM | POA: Diagnosis not present

## 2024-08-04 DIAGNOSIS — Z0000 Encounter for general adult medical examination without abnormal findings: Secondary | ICD-10-CM | POA: Diagnosis not present

## 2024-08-04 DIAGNOSIS — M0609 Rheumatoid arthritis without rheumatoid factor, multiple sites: Secondary | ICD-10-CM | POA: Diagnosis not present

## 2024-08-04 DIAGNOSIS — D72819 Decreased white blood cell count, unspecified: Secondary | ICD-10-CM | POA: Diagnosis not present

## 2024-08-04 DIAGNOSIS — N1831 Chronic kidney disease, stage 3a: Secondary | ICD-10-CM | POA: Diagnosis not present

## 2024-08-04 DIAGNOSIS — R7303 Prediabetes: Secondary | ICD-10-CM | POA: Diagnosis not present

## 2024-08-04 DIAGNOSIS — E7841 Elevated Lipoprotein(a): Secondary | ICD-10-CM | POA: Diagnosis not present

## 2024-08-04 DIAGNOSIS — R5383 Other fatigue: Secondary | ICD-10-CM | POA: Diagnosis not present

## 2024-08-04 NOTE — Anesthesia Preprocedure Evaluation (Addendum)
 Anesthesia Evaluation  Patient identified by MRN, date of birth, ID band Patient awake    Reviewed: Allergy & Precautions, NPO status , Patient's Chart, lab work & pertinent test results  Airway Mallampati: II  TM Distance: >3 FB Neck ROM: Full    Dental  (+) Dental Advisory Given, Chipped,    Pulmonary neg pulmonary ROS   Pulmonary exam normal breath sounds clear to auscultation       Cardiovascular hypertension, Pt. on medications Normal cardiovascular exam Rhythm:Regular Rate:Normal     Neuro/Psych  Headaches PSYCHIATRIC DISORDERS Anxiety Depression       GI/Hepatic Neg liver ROS,GERD  ,,  Endo/Other  negative endocrine ROS    Renal/GU negative Renal ROS  negative genitourinary   Musculoskeletal  (+) Arthritis ,    Abdominal   Peds  Hematology negative hematology ROS (+)   Anesthesia Other Findings History includes never smoker, HTN, GERD, anemia, glaucoma, arthritis (OA, RA), spinal surgery (L4-5 fusion 08/26/2016, L2-3 fusion 05/28/2022).  Reproductive/Obstetrics                              Anesthesia Physical Anesthesia Plan  ASA: 2  Anesthesia Plan: Spinal and Regional   Post-op Pain Management: Regional block* and Tylenol  PO (pre-op)*   Induction:   PONV Risk Score and Plan: 2 and Treatment may vary due to age or medical condition, Propofol  infusion, Ondansetron  and Dexamethasone   Airway Management Planned: Natural Airway  Additional Equipment:   Intra-op Plan:   Post-operative Plan:   Informed Consent: I have reviewed the patients History and Physical, chart, labs and discussed the procedure including the risks, benefits and alternatives for the proposed anesthesia with the patient or authorized representative who has indicated his/her understanding and acceptance.     Dental advisory given  Plan Discussed with: CRNA  Anesthesia Plan Comments: (PAT note written  08/04/2024 by Allison Zelenak, PA-C.  )         Anesthesia Quick Evaluation

## 2024-08-04 NOTE — Progress Notes (Signed)
 Anesthesia Chart Review: SAME DAY WORK-UP  Case: [id number] Date/Time: 08/07/24 0700   Procedure: ARTHROPLASTY, KNEE, TOTAL (Left: Knee)   Anesthesia type: Spinal   Diagnosis: Osteophyte, left knee [M25.762]   Pre-op diagnosis: left knee osteoarthritis   [facility] RO[facility] 09 / [facility]   Surgeons: Jerri Kay HERO, MD       DISCUSSION: Patient is a 73 year old female scheduled for the above procedure.  History includes never smoker, HTN, GERD, anemia, glaucoma, arthritis (OA, RA), spinal surgery (L4-5 fusion 08/26/2016, L2-3 fusion 05/28/2022).  PCP Dr. Verdia classified her as above average/moderate risk for surgery and advised cardiology clearance. His note is scanned under Media tab.  She had a preoperative cardiology evaluation by Lonni Slain, MD on 06/23/2024. She wrote, Preoperative cardiovascular exam No active cardiac issues, no history of ASCVD (we have discussed prevention, no known disease). Limited by joint pain but was performing >4 mets before pain worsened, still intermittently climbs stairs. Acceptable risk from cardiac perspective, no further testing indicated. As needed cardiology follow-up recommended.   Anesthesia team to evaluate on the day of surgery.    VS: BP (!) 149/88   Pulse 87   Temp 36.6 C   Resp 16   Ht 4' 11 (1.499 m)   Wt 52.6 kg   SpO2 100%   BMI 23.41 kg/m   PROVIDERS: Verdia Lombard, MD his PCP Curt Lighter, MD is rheumatologist Lonni Slain, MD is cardiologist. Preoperative evaluation on 06/23/2024. No testing ordered with as needed follow-up recommended.   LABS: Labs reviewed: Acceptable for surgery. She had labs marked as reviewed on 08/04/2024 at [facility] (see Care Everywhere) that show a Cr 0.79, A1c 5.5%, total bili 0.4, alk phos 115, AST 33, ALT 33, Na 142, K 4.1, H/H 11.1/34.5, PLT 177, TSH 1.030. (all labs ordered are listed, but only abnormal results are displayed)  Labs Reviewed  CBC -  Abnormal; Notable for the following components:      Result Value   WBC 3.4 (*)    Hemoglobin 10.9 (*)    HCT 32.8 (*)    RDW 17.1 (*)    All other components within normal limits  SURGICAL PCR SCREEN  BASIC METABOLIC PANEL WITH GFR    IMAGES: X-ray left knee 07/21/2024: X-rays of the left knee show advanced tricompartmental osteoarthritis.   Bone-on-bone joint space narrowing.  Kellgren-Lawrence stage IV.  Valgus  deformity.   CT Orbits 08/06/2023: IMPRESSION: 1. Bilateral proptosis, minimally more prominent on the left than the right. No abnormality of the globes or extra-ocular muscles. Old medial orbital blowout fracture on the right containing orbital fat. This could explain the slight asymmetry of proptosis. Question thyroid  eye disease.   US  Thyroid  07/05/2023: IMPRESSION: Mildly heterogeneous and borderline enlarged thyroid  gland. No discrete thyroid  nodules.   EKG: 06/23/2024: Normal sinus rhythm Right atrial enlargement Possible Anterolateral infarct , age undetermined Confirmed by Lonni Slain [phone]) on 06/23/2024 3:53:00 PM  CV: Reported a remote history of a stress test.   Past Medical History:  Diagnosis Date   Anemia    many years ago   Anxiety    Arthritis    Depression    GERD (gastroesophageal reflux disease)    Glaucoma    Headache    hx of migraines, no longer have them   HTN (hypertension)     Past Surgical History:  Procedure Laterality Date   BACK SURGERY  2017   CARPAL TUNNEL RELEASE Right 07/17/1989   CARPAL TUNNEL  RELEASE Left 01/04/2016   Procedure: CARPAL TUNNEL RELEASE;  Surgeon: Elsie Mussel, MD;  Location: [facility];  Service: Orthopedics;  Laterality: Left;   CESAREAN SECTION  11/17/1983   COLONOSCOPY N/A 09/15/2013   Procedure: COLONOSCOPY;  Surgeon: Belvie JONETTA Just, MD;  [facility];  Service: Endoscopy;  Laterality: N/A;   ESOPHAGOGASTRODUODENOSCOPY N/A 09/15/2013   Procedure: ESOPHAGOGASTRODUODENOSCOPY  (EGD);  Surgeon: Belvie JONETTA Just, MD;  Location: [facility];  Service: Endoscopy;  Laterality: N/A;   ORIF RADIAL FRACTURE Left 01/04/2016   Procedure: OPEN REDUCTION INTERNAL FIXATION (ORIF) LEFT RADIAL FRACTURE;  Surgeon: Elsie Mussel, MD;  Location: [facility];  Service: Orthopedics;  Laterality: Left;   ROTATOR CUFF REPAIR Bilateral 2010 and 2013    MEDICATIONS:  oxyCODONE -acetaminophen  (PERCOCET) 5-325 MG tablet   acetaminophen  (TYLENOL ) 500 MG tablet   amLODipine (NORVASC) 5 MG tablet   Ascorbic Acid  (VITAMIN C  PO)   atorvastatin  (LIPITOR) 40 MG tablet   CALCIUM  PO   Cholecalciferol (VITAMIN D-3 PO)   diclofenac Sodium (VOLTAREN) 1 % GEL   docusate sodium  (COLACE) 100 MG capsule   docusate sodium  (COLACE) 100 MG capsule   ferrous sulfate  325 (65 FE) MG tablet   HUMIRA, 2 PEN, 40 MG/0.4ML pen   hydrochlorothiazide  (HYDRODIURIL ) 25 MG tablet   latanoprost  (XALATAN ) 0.005 % ophthalmic solution   methocarbamol  (ROBAXIN ) 500 MG tablet   methocarbamol  (ROBAXIN ) 750 MG tablet   Multiple Vitamin (MULTIVITAMIN WITH MINERALS) TABS tablet   ofloxacin  (OCUFLOX ) 0.3 % ophthalmic solution   omeprazole (PRILOSEC) 40 MG capsule   ondansetron  (ZOFRAN ) 4 MG tablet   sertraline  (ZOLOFT ) 100 MG tablet   tiZANidine (ZANAFLEX) 2 MG tablet   triamcinolone  (NASACORT ALLERGY 24HR) 55 MCG/ACT AERO nasal inhaler   valsartan (DIOVAN) 320 MG tablet   VITAMIN E PO   No current facility-administered medications for this encounter.    Isaiah Ruder, PA-C Surgical Short Stay/Anesthesiology [facility] Phone [phone] [facility] Phone [phone] 08/04/2024 10:40 AM

## 2024-08-05 DIAGNOSIS — M1712 Unilateral primary osteoarthritis, left knee: Secondary | ICD-10-CM | POA: Diagnosis not present

## 2024-08-06 DIAGNOSIS — M1712 Unilateral primary osteoarthritis, left knee: Secondary | ICD-10-CM | POA: Insufficient documentation

## 2024-08-07 ENCOUNTER — Observation Stay (HOSPITAL_COMMUNITY)

## 2024-08-07 ENCOUNTER — Encounter (HOSPITAL_COMMUNITY): Payer: Self-pay | Admitting: Orthopaedic Surgery

## 2024-08-07 ENCOUNTER — Observation Stay (HOSPITAL_COMMUNITY)
Admission: RE | Admit: 2024-08-07 | Discharge: 2024-08-08 | Disposition: A | Discharge status: Home / Self Care | Attending: Orthopaedic Surgery | Admitting: Orthopaedic Surgery

## 2024-08-07 ENCOUNTER — Other Ambulatory Visit: Payer: Self-pay

## 2024-08-07 ENCOUNTER — Ambulatory Visit (HOSPITAL_COMMUNITY): Payer: Self-pay | Admitting: Vascular Surgery

## 2024-08-07 ENCOUNTER — Encounter (HOSPITAL_COMMUNITY)
Admission: RE | Disposition: A | Discharge status: Home / Self Care | Payer: Self-pay | Source: Home / Self Care | Attending: Orthopaedic Surgery

## 2024-08-07 ENCOUNTER — Ambulatory Visit (HOSPITAL_BASED_OUTPATIENT_CLINIC_OR_DEPARTMENT_OTHER): Admitting: Anesthesiology

## 2024-08-07 ENCOUNTER — Other Ambulatory Visit (HOSPITAL_COMMUNITY): Payer: Self-pay

## 2024-08-07 ENCOUNTER — Other Ambulatory Visit: Payer: Self-pay | Admitting: Physician Assistant

## 2024-08-07 DIAGNOSIS — M1712 Unilateral primary osteoarthritis, left knee: Principal | ICD-10-CM | POA: Insufficient documentation

## 2024-08-07 DIAGNOSIS — Z7901 Long term (current) use of anticoagulants: Secondary | ICD-10-CM | POA: Diagnosis not present

## 2024-08-07 DIAGNOSIS — M25562 Pain in left knee: Secondary | ICD-10-CM | POA: Diagnosis not present

## 2024-08-07 DIAGNOSIS — G8918 Other acute postprocedural pain: Secondary | ICD-10-CM | POA: Diagnosis not present

## 2024-08-07 DIAGNOSIS — R609 Edema, unspecified: Secondary | ICD-10-CM | POA: Diagnosis not present

## 2024-08-07 DIAGNOSIS — Z96652 Presence of left artificial knee joint: Secondary | ICD-10-CM | POA: Diagnosis not present

## 2024-08-07 DIAGNOSIS — I10 Essential (primary) hypertension: Secondary | ICD-10-CM | POA: Insufficient documentation

## 2024-08-07 DIAGNOSIS — Z79899 Other long term (current) drug therapy: Secondary | ICD-10-CM | POA: Insufficient documentation

## 2024-08-07 DIAGNOSIS — M1612 Unilateral primary osteoarthritis, left hip: Principal | ICD-10-CM

## 2024-08-07 DIAGNOSIS — Z471 Aftercare following joint replacement surgery: Secondary | ICD-10-CM | POA: Diagnosis not present

## 2024-08-07 HISTORY — PX: TOTAL KNEE ARTHROPLASTY: SHX125

## 2024-08-07 LAB — GLUCOSE, CAPILLARY
Glucose-Capillary: 126 mg/dL — ABNORMAL HIGH (ref 70–99)
Glucose-Capillary: 138 mg/dL — ABNORMAL HIGH (ref 70–99)

## 2024-08-07 SURGERY — ARTHROPLASTY, KNEE, TOTAL
Anesthesia: Regional | Site: Knee | Laterality: Left

## 2024-08-07 MED ORDER — ACETAMINOPHEN 500 MG PO TABS
1000.0000 mg | ORAL_TABLET | Freq: Four times a day (QID) | ORAL | Status: AC
Start: 2024-08-07 — End: 2024-08-08
  Administered 2024-08-07 – 2024-08-08 (×4): 1000 mg via ORAL
  Filled 2024-08-07 (×4): qty 2

## 2024-08-07 MED ORDER — APIXABAN 2.5 MG PO TABS
2.5000 mg | ORAL_TABLET | Freq: Two times a day (BID) | ORAL | 0 refills | Status: AC
  Filled 2024-08-07: qty 60, 30d supply, fill #0

## 2024-08-07 MED ORDER — IRBESARTAN 150 MG PO TABS
300.0000 mg | ORAL_TABLET | Freq: Every day | ORAL | Status: DC
  Administered 2024-08-07: 300 mg via ORAL
  Filled 2024-08-07: qty 2

## 2024-08-07 MED ORDER — HYDROMORPHONE HCL 1 MG/ML IJ SOLN: 1.0000 mg | Freq: Three times a day (TID) | INTRAMUSCULAR | Status: DC | PRN

## 2024-08-07 MED ORDER — PHENOL 1.4 % MT LIQD: 1.0000 | OROMUCOSAL | Status: DC | PRN

## 2024-08-07 MED ORDER — VANCOMYCIN HCL 1000 MG IV SOLR
INTRAVENOUS | Status: AC
  Filled 2024-08-07: qty 20

## 2024-08-07 MED ORDER — SODIUM CHLORIDE 0.9 % IV SOLN: INTRAVENOUS | Status: DC

## 2024-08-07 MED ORDER — 0.9 % SODIUM CHLORIDE (POUR BTL) OPTIME
TOPICAL | Status: DC | PRN
  Administered 2024-08-07: 500 mL

## 2024-08-07 MED ORDER — LACTATED RINGERS IV BOLUS
500.0000 mL | Freq: Once | INTRAVENOUS | Status: AC
  Administered 2024-08-07: 500 mL via INTRAVENOUS

## 2024-08-07 MED ORDER — VANCOMYCIN HCL 1000 MG IV SOLR
INTRAVENOUS | Status: DC | PRN
  Administered 2024-08-07: 1000 mg

## 2024-08-07 MED ORDER — PRONTOSAN WOUND IRRIGATION OPTIME
TOPICAL | Status: DC | PRN
  Administered 2024-08-07: 1 via TOPICAL

## 2024-08-07 MED ORDER — ONDANSETRON HCL 4 MG/2ML IJ SOLN
INTRAMUSCULAR | Status: DC | PRN
  Administered 2024-08-07: 4 mg via INTRAVENOUS

## 2024-08-07 MED ORDER — ACETAMINOPHEN 500 MG PO TABS: 1000.0000 mg | ORAL_TABLET | Freq: Once | ORAL | Status: AC

## 2024-08-07 MED ORDER — OXYCODONE HCL 5 MG PO TABS
5.0000 mg | ORAL_TABLET | Freq: Four times a day (QID) | ORAL | Status: DC | PRN
  Administered 2024-08-07: 5 mg via ORAL
  Filled 2024-08-07: qty 1

## 2024-08-07 MED ORDER — TRANEXAMIC ACID-NACL 1000-0.7 MG/100ML-% IV SOLN
1000.0000 mg | Freq: Once | INTRAVENOUS | Status: AC
  Administered 2024-08-07: 1000 mg via INTRAVENOUS
  Filled 2024-08-07: qty 100

## 2024-08-07 MED ORDER — CEFAZOLIN SODIUM-DEXTROSE 2-4 GM/100ML-% IV SOLN
2.0000 g | Freq: Four times a day (QID) | INTRAVENOUS | Status: AC
  Administered 2024-08-07 (×2): 2 g via INTRAVENOUS
  Filled 2024-08-07 (×2): qty 100

## 2024-08-07 MED ORDER — ONDANSETRON HCL 4 MG/2ML IJ SOLN
INTRAMUSCULAR | Status: AC
  Filled 2024-08-07: qty 2

## 2024-08-07 MED ORDER — BUPIVACAINE-MELOXICAM ER 400-12 MG/14ML IJ SOLN
INTRAMUSCULAR | Status: DC | PRN
  Administered 2024-08-07: 400 mg

## 2024-08-07 MED ORDER — ACETAMINOPHEN 500 MG PO TABS
ORAL_TABLET | ORAL | Status: AC
  Administered 2024-08-07: 1000 mg via ORAL
  Filled 2024-08-07: qty 2

## 2024-08-07 MED ORDER — TRANEXAMIC ACID 1000 MG/10ML IV SOLN
2000.0000 mg | INTRAVENOUS | Status: DC
  Filled 2024-08-07 (×2): qty 20

## 2024-08-07 MED ORDER — BUPIVACAINE IN DEXTROSE 0.75-8.25 % IT SOLN
INTRATHECAL | Status: DC | PRN
  Administered 2024-08-07: 1.6 mL via INTRATHECAL

## 2024-08-07 MED ORDER — ACETAMINOPHEN 325 MG PO TABS: 325.0000 mg | ORAL_TABLET | Freq: Four times a day (QID) | ORAL | Status: DC | PRN

## 2024-08-07 MED ORDER — AMLODIPINE BESYLATE 5 MG PO TABS: 5.0000 mg | ORAL_TABLET | Freq: Every day | ORAL | Status: DC

## 2024-08-07 MED ORDER — OXYCODONE HCL 5 MG PO TABS: 5.0000 mg | ORAL_TABLET | Freq: Once | ORAL | Status: DC | PRN

## 2024-08-07 MED ORDER — CEFAZOLIN SODIUM-DEXTROSE 2-4 GM/100ML-% IV SOLN
2.0000 g | INTRAVENOUS | Status: AC
  Administered 2024-08-07: 2 g via INTRAVENOUS

## 2024-08-07 MED ORDER — CHLORHEXIDINE GLUCONATE 0.12 % MT SOLN
OROMUCOSAL | Status: AC
  Filled 2024-08-07: qty 15

## 2024-08-07 MED ORDER — DOCUSATE SODIUM 100 MG PO CAPS
100.0000 mg | ORAL_CAPSULE | Freq: Two times a day (BID) | ORAL | Status: DC
  Administered 2024-08-07: 100 mg via ORAL
  Filled 2024-08-07: qty 1

## 2024-08-07 MED ORDER — CEFAZOLIN SODIUM-DEXTROSE 2-4 GM/100ML-% IV SOLN
INTRAVENOUS | Status: AC
  Filled 2024-08-07: qty 100

## 2024-08-07 MED ORDER — METHOCARBAMOL 500 MG PO TABS
500.0000 mg | ORAL_TABLET | Freq: Four times a day (QID) | ORAL | Status: DC | PRN
  Administered 2024-08-07 – 2024-08-08 (×3): 500 mg via ORAL
  Filled 2024-08-07 (×3): qty 1

## 2024-08-07 MED ORDER — OXYCODONE HCL 5 MG/5ML PO SOLN: 5.0000 mg | Freq: Once | ORAL | Status: DC | PRN

## 2024-08-07 MED ORDER — BUPIVACAINE-MELOXICAM ER 400-12 MG/14ML IJ SOLN
INTRAMUSCULAR | Status: AC
  Filled 2024-08-07: qty 1

## 2024-08-07 MED ORDER — KETOROLAC TROMETHAMINE 15 MG/ML IJ SOLN
7.5000 mg | Freq: Four times a day (QID) | INTRAMUSCULAR | Status: AC
Start: 2024-08-07 — End: 2024-08-08
  Administered 2024-08-07 – 2024-08-08 (×4): 7.5 mg via INTRAVENOUS
  Filled 2024-08-07 (×4): qty 1

## 2024-08-07 MED ORDER — SODIUM CHLORIDE 0.9 % IR SOLN
Status: DC | PRN
  Administered 2024-08-07: 1000 mL

## 2024-08-07 MED ORDER — TRANEXAMIC ACID-NACL 1000-0.7 MG/100ML-% IV SOLN
INTRAVENOUS | Status: AC
  Filled 2024-08-07: qty 100

## 2024-08-07 MED ORDER — AMISULPRIDE (ANTIEMETIC) 5 MG/2ML IV SOLN: 10.0000 mg | Freq: Once | INTRAVENOUS | Status: DC | PRN

## 2024-08-07 MED ORDER — ROPIVACAINE HCL 5 MG/ML IJ SOLN
INTRAMUSCULAR | Status: DC | PRN
  Administered 2024-08-07: 20 mL via PERINEURAL

## 2024-08-07 MED ORDER — METOCLOPRAMIDE HCL 5 MG PO TABS
5.0000 mg | ORAL_TABLET | Freq: Three times a day (TID) | ORAL | Status: DC | PRN
  Filled 2024-08-07: qty 2

## 2024-08-07 MED ORDER — LIDOCAINE 2% (20 MG/ML) 5 ML SYRINGE
INTRAMUSCULAR | Status: AC
Start: 2024-08-07 — End: 2024-08-07
  Filled 2024-08-07: qty 5

## 2024-08-07 MED ORDER — MIDAZOLAM HCL 2 MG/2ML IJ SOLN
INTRAMUSCULAR | Status: AC
  Filled 2024-08-07: qty 2

## 2024-08-07 MED ORDER — MENTHOL 3 MG MT LOZG: 1.0000 | LOZENGE | OROMUCOSAL | Status: DC | PRN

## 2024-08-07 MED ORDER — DEXAMETHASONE SODIUM PHOSPHATE 10 MG/ML IJ SOLN
10.0000 mg | Freq: Once | INTRAMUSCULAR | Status: AC
Start: 2024-08-08 — End: 2024-08-08
  Administered 2024-08-08: 10 mg via INTRAVENOUS
  Filled 2024-08-07: qty 1

## 2024-08-07 MED ORDER — TRANEXAMIC ACID 1000 MG/10ML IV SOLN
INTRAVENOUS | Status: DC | PRN
  Administered 2024-08-07: 2000 mg via TOPICAL

## 2024-08-07 MED ORDER — DEXAMETHASONE SODIUM PHOSPHATE 10 MG/ML IJ SOLN
INTRAMUSCULAR | Status: AC
  Filled 2024-08-07: qty 1

## 2024-08-07 MED ORDER — TRANEXAMIC ACID-NACL 1000-0.7 MG/100ML-% IV SOLN
1000.0000 mg | INTRAVENOUS | Status: AC
  Administered 2024-08-07: 1000 mg via INTRAVENOUS

## 2024-08-07 MED ORDER — POVIDONE-IODINE 10 % EX SWAB
2.0000 | Freq: Once | CUTANEOUS | Status: AC
  Administered 2024-08-07: 2 via TOPICAL

## 2024-08-07 MED ORDER — PROPOFOL 10 MG/ML IV BOLUS
INTRAVENOUS | Status: DC | PRN
  Administered 2024-08-07: 20 ug via INTRAVENOUS
  Administered 2024-08-07: 50 mg via INTRAVENOUS
  Administered 2024-08-07: 25 ug/kg/min via INTRAVENOUS

## 2024-08-07 MED ORDER — METHOCARBAMOL 1000 MG/10ML IJ SOLN
500.0000 mg | Freq: Four times a day (QID) | INTRAMUSCULAR | Status: DC | PRN
  Filled 2024-08-07: qty 5

## 2024-08-07 MED ORDER — LACTATED RINGERS IV SOLN: INTRAVENOUS | Status: DC

## 2024-08-07 MED ORDER — ONDANSETRON HCL 4 MG PO TABS: 4.0000 mg | ORAL_TABLET | Freq: Four times a day (QID) | ORAL | Status: DC | PRN

## 2024-08-07 MED ORDER — METOCLOPRAMIDE HCL 5 MG/ML IJ SOLN: 5.0000 mg | Freq: Three times a day (TID) | INTRAMUSCULAR | Status: DC | PRN

## 2024-08-07 MED ORDER — OXYCODONE HCL 5 MG PO TABS
10.0000 mg | ORAL_TABLET | Freq: Four times a day (QID) | ORAL | Status: DC | PRN
  Administered 2024-08-07 – 2024-08-08 (×3): 10 mg via ORAL
  Filled 2024-08-07 (×3): qty 2

## 2024-08-07 MED ORDER — ONDANSETRON HCL 4 MG/2ML IJ SOLN: 4.0000 mg | Freq: Four times a day (QID) | INTRAMUSCULAR | Status: DC | PRN

## 2024-08-07 MED ORDER — FENTANYL CITRATE (PF) 100 MCG/2ML IJ SOLN: 25.0000 ug | INTRAMUSCULAR | Status: DC | PRN

## 2024-08-07 MED ORDER — LIDOCAINE 2% (20 MG/ML) 5 ML SYRINGE
INTRAMUSCULAR | Status: DC | PRN
  Administered 2024-08-07: 40 mg via INTRAVENOUS

## 2024-08-07 MED ORDER — FENTANYL CITRATE (PF) 250 MCG/5ML IJ SOLN
INTRAMUSCULAR | Status: AC
  Filled 2024-08-07: qty 5

## 2024-08-07 MED ORDER — DEXAMETHASONE SODIUM PHOSPHATE 10 MG/ML IJ SOLN
INTRAMUSCULAR | Status: DC | PRN
  Administered 2024-08-07: 10 mg via PERINEURAL

## 2024-08-07 MED ORDER — APIXABAN 2.5 MG PO TABS
2.5000 mg | ORAL_TABLET | Freq: Two times a day (BID) | ORAL | Status: DC
  Filled 2024-08-07: qty 1

## 2024-08-07 SURGICAL SUPPLY — 70 items
ALCOHOL 70% 16 OZ (MISCELLANEOUS) ×1 IMPLANT
BAG COUNTER SPONGE SURGICOUNT (BAG) IMPLANT
BAG DECANTER FOR FLEXI CONT (MISCELLANEOUS) ×1 IMPLANT
BANDAGE ESMARK 6X9 LF (GAUZE/BANDAGES/DRESSINGS) IMPLANT
BLADE SAG 18X100X1.27 (BLADE) ×1 IMPLANT
BLADE SAW SAG 90X13X1.27 (BLADE) ×1 IMPLANT
BLADE SAW SGTL 73X25 THK (BLADE) ×1 IMPLANT
BOWL SMART MIX CTS (DISPOSABLE) IMPLANT
CEMENT BONE REFOBACIN R1X40 US (Cement) IMPLANT
CLSR STERI-STRIP ANTIMIC 1/2X4 (GAUZE/BANDAGES/DRESSINGS) IMPLANT
COMPONENT FEM CMT PERS SZ7 LT (Joint) IMPLANT
COOLER ICEMAN CLASSIC (MISCELLANEOUS) ×1 IMPLANT
COVER SURGICAL LIGHT HANDLE (MISCELLANEOUS) ×1 IMPLANT
CUFF TOURN SGL QUICK 42 (TOURNIQUET CUFF) IMPLANT
CUFF TRNQT CYL 34X4.125X (TOURNIQUET CUFF) ×1 IMPLANT
DERMABOND ADVANCED .7 DNX12 (GAUZE/BANDAGES/DRESSINGS) ×1 IMPLANT
DRAPE EXTREMITY T 121X128X90 (DISPOSABLE) ×1 IMPLANT
DRAPE HALF SHEET 40X57 (DRAPES) ×1 IMPLANT
DRAPE INCISE IOBAN 66X45 STRL (DRAPES) ×1 IMPLANT
DRAPE POUCH INSTRU U-SHP 10X18 (DRAPES) ×1 IMPLANT
DRAPE SURG ORHT 6 SPLT 77X108 (DRAPES) IMPLANT
DRAPE U-SHAPE 47X51 STRL (DRAPES) ×2 IMPLANT
DRSG AQUACEL AG ADV 3.5X10 (GAUZE/BANDAGES/DRESSINGS) ×1 IMPLANT
DURAPREP 26ML APPLICATOR (WOUND CARE) ×3 IMPLANT
ELECT CAUTERY BLADE 6.4 (BLADE) ×1 IMPLANT
ELECT PENCIL ROCKER SW 15FT (MISCELLANEOUS) ×1 IMPLANT
ELECTRODE REM PT RTRN 9FT ADLT (ELECTROSURGICAL) ×1 IMPLANT
GLOVE BIOGEL PI IND STRL 7.0 (GLOVE) ×2 IMPLANT
GLOVE BIOGEL PI IND STRL 7.5 (GLOVE) ×5 IMPLANT
GLOVE ECLIPSE 7.0 STRL STRAW (GLOVE) ×3 IMPLANT
GLOVE SURG SYN 7.5 PF PI (GLOVE) ×2 IMPLANT
GLOVE SURG UNDER LTX SZ7.5 (GLOVE) ×2 IMPLANT
GOWN STRL REUS W/ TWL LRG LVL3 (GOWN DISPOSABLE) ×1 IMPLANT
GOWN STRL SURGICAL XL XLNG (GOWN DISPOSABLE) IMPLANT
GOWN TOGA ZIPPER T7+ PEEL AWAY (MISCELLANEOUS) ×1 IMPLANT
HOOD PEEL AWAY T7 (MISCELLANEOUS) ×1 IMPLANT
INSERT ARTISURF SZ 6-7 LT (Insert) IMPLANT
KIT BASIN OR (CUSTOM PROCEDURE TRAY) ×1 IMPLANT
KIT TURNOVER KIT B (KITS) ×1 IMPLANT
MANIFOLD NEPTUNE II (INSTRUMENTS) ×1 IMPLANT
MARKER SKIN DUAL TIP RULER LAB (MISCELLANEOUS) ×2 IMPLANT
NDL SPNL 18GX3.5 QUINCKE PK (NEEDLE) ×1 IMPLANT
NEEDLE SPNL 18GX3.5 QUINCKE PK (NEEDLE) ×1 IMPLANT
NS IRRIG 1000ML POUR BTL (IV SOLUTION) ×1 IMPLANT
PACK TOTAL JOINT (CUSTOM PROCEDURE TRAY) ×1 IMPLANT
PAD ARMBOARD POSITIONER FOAM (MISCELLANEOUS) ×2 IMPLANT
PAD COLD SHLDR WRAP-ON (PAD) ×1 IMPLANT
PIN DRILL HDLS TROCAR 75 4PK (PIN) IMPLANT
SCREW FEMALE HEX FIX 25X2.5 (ORTHOPEDIC DISPOSABLE SUPPLIES) IMPLANT
SET HNDPC FAN SPRY TIP SCT (DISPOSABLE) ×1 IMPLANT
SOLUTION PRONTOSAN WOUND 350ML (IRRIGATION / IRRIGATOR) ×1 IMPLANT
STAPLER SKIN PROX 35W (STAPLE) IMPLANT
STEM POLY PAT PLY 29M KNEE (Knees) IMPLANT
STEM TIB ST PERS 14+30 (Stem) IMPLANT
STEM TIBIA 5 DEG SZ D L KNEE (Knees) IMPLANT
SUCTION TUBE FRAZIER 10FR DISP (SUCTIONS) IMPLANT
SUT ETHILON 2 0 FS 18 (SUTURE) IMPLANT
SUT MNCRL AB 3-0 PS2 27 (SUTURE) IMPLANT
SUT STRATAFIX PDS+ 0 24IN (SUTURE) IMPLANT
SUT VIC AB 0 CT1 27XBRD ANBCTR (SUTURE) ×2 IMPLANT
SUT VIC AB 1 CTX 27 (SUTURE) ×3 IMPLANT
SUT VIC AB 2-0 CT1 TAPERPNT 27 (SUTURE) ×4 IMPLANT
SYR 50ML LL SCALE MARK (SYRINGE) ×2 IMPLANT
TOWEL GREEN STERILE (TOWEL DISPOSABLE) ×1 IMPLANT
TOWEL GREEN STERILE FF (TOWEL DISPOSABLE) ×1 IMPLANT
TRAY CATH INTERMITTENT SS 16FR (CATHETERS) IMPLANT
TUBE SUCT ARGYLE STRL (TUBING) ×1 IMPLANT
UNDERPAD 30X36 HEAVY ABSORB (UNDERPADS AND DIAPERS) ×1 IMPLANT
WARMER LAPAROSCOPE (MISCELLANEOUS) IMPLANT
YANKAUER SUCT BULB TIP NO VENT (SUCTIONS) ×1 IMPLANT

## 2024-08-07 NOTE — H&P (Signed)
 PREOPERATIVE H&P  Chief Complaint: left knee osteoarthritis  HPI: Alyssa Valentine is a 73 y.o. female who presents for surgical treatment of left knee osteoarthritis.  She denies any changes in medical history.  Past Surgical History:  Procedure Laterality Date  . BACK SURGERY  2017  . CARPAL TUNNEL RELEASE Right 07/17/1989  . CARPAL TUNNEL RELEASE Left 01/04/2016   Procedure: CARPAL TUNNEL RELEASE;  Surgeon: Elsie Mussel, MD;  Location: [facility] OR;  Service: Orthopedics;  Laterality: Left;  . CESAREAN SECTION  11/17/1983  . COLONOSCOPY N/A 09/15/2013   Procedure: COLONOSCOPY;  Surgeon: Belvie JONETTA Just, MD;  Location: [facility];  Service: Endoscopy;  Laterality: N/A;  . ESOPHAGOGASTRODUODENOSCOPY N/A 09/15/2013   Procedure: ESOPHAGOGASTRODUODENOSCOPY (EGD);  Surgeon: Belvie JONETTA Just, MD;  Location: [facility];  Service: Endoscopy;  Laterality: N/A;  . ORIF RADIAL FRACTURE Left 01/04/2016   Procedure: OPEN REDUCTION INTERNAL FIXATION (ORIF) LEFT RADIAL FRACTURE;  Surgeon: Elsie Mussel, MD;  Location: [facility] OR;  Service: Orthopedics;  Laterality: Left;  . ROTATOR CUFF REPAIR Bilateral 2010 and 2013   Social History   Socioeconomic History  . Marital status: Married    Spouse name: Not on file  . Number of children: Not on file  . Years of education: Not on file  . Highest education level: Not on file  Occupational History  . Not on file  Tobacco Use  . Smoking status: Never  . Smokeless tobacco: Never  Vaping Use  . Vaping status: Never Used  Substance and Sexual Activity  . Alcohol use: Yes    Comment: occasional  . Drug use: No  . Sexual activity: Not on file  Other Topics Concern  . Not on file  Social History Narrative  . Not on file   Social Drivers of Health   Financial Resource Strain: Not on file  Food Insecurity: Not on file  Transportation Needs: Not on file  Physical Activity: Not on file  Stress: Not on file  Social Connections: Unknown (03/27/2022)    Received from [facility]   Social Network   . Social Network: Not on file   No family history on file. Allergies  Allergen Reactions  . Sulfa Antibiotics Itching and Swelling   Prior to Admission medications   Medication Sig Start Date End Date Taking? Authorizing Provider  acetaminophen  (TYLENOL ) 500 MG tablet Take 1,000 mg by mouth every 6 (six) hours as needed for mild pain (pain score 1-3).   Yes [provider]  amLODipine  (NORVASC ) 5 MG tablet Take 5 mg by mouth daily.   Yes [provider]  Ascorbic Acid  (VITAMIN C  PO) Take 1 tablet by mouth daily.   Yes [provider]  atorvastatin  (LIPITOR) 40 MG tablet Take 40 mg by mouth every evening.   Yes [provider]  CALCIUM  PO Take 1 tablet by mouth daily.   Yes [provider]  Cholecalciferol (VITAMIN D-3 PO) Take 1 capsule by mouth daily.   Yes [provider]  diclofenac Sodium (VOLTAREN) 1 % GEL Apply 1 Application topically 2 (two) times daily as needed (pain).   Yes [provider]  docusate sodium  (COLACE) 100 MG capsule Take 1 capsule (100 mg total) by mouth daily as needed. 07/28/24 07/28/25 Yes Stanbery, Mary L, PA-C  HUMIRA, 2 PEN, 40 MG/0.4ML pen Inject 40 mg into the skin every 14 (fourteen) days. 07/10/24  Yes [provider]  hydrochlorothiazide  (HYDRODIURIL ) 25 MG tablet Take 25 mg by mouth  daily.   Yes [provider]  latanoprost  (XALATAN ) 0.005 % ophthalmic solution Place 1 drop into both eyes at bedtime. 12/09/15  Yes [provider]  methocarbamol  (ROBAXIN ) 750 MG tablet Take 1 tablet (750 mg total) by mouth 3 (three) times daily as needed. 07/28/24  Yes Jule Ronal CROME, PA-C  Multiple Vitamin (MULTIVITAMIN WITH MINERALS) TABS tablet Take 1 tablet by mouth daily.   Yes [provider]  omeprazole (PRILOSEC) 40 MG capsule Take 40 mg by mouth daily.   Yes [provider]  ondansetron  (ZOFRAN ) 4 MG tablet Take  1 tablet (4 mg total) by mouth every 8 (eight) hours as needed for nausea or vomiting. 07/28/24  Yes Jule Ronal CROME, PA-C  oxyCODONE -acetaminophen  (PERCOCET) 5-325 MG tablet Take 1-2 tablets by mouth every 6 (six) hours as needed. To be taken after surgery 07/28/24   Jule Ronal CROME, PA-C  sertraline  (ZOLOFT ) 100 MG tablet Take 200 mg by mouth daily. 12/14/15  Yes [provider]  tiZANidine (ZANAFLEX) 2 MG tablet Take 2 mg by mouth 3 (three) times daily as needed for muscle spasms. 07/04/24  Yes [provider]  triamcinolone  (NASACORT ALLERGY 24HR) 55 MCG/ACT AERO nasal inhaler Place 2 sprays into the nose daily as needed (allergies).   Yes [provider]  valsartan (DIOVAN) 320 MG tablet Take 320 mg by mouth daily.   Yes [provider]  VITAMIN E PO Take 1 capsule by mouth daily.   Yes [provider]  docusate sodium  (COLACE) 100 MG capsule Take 1 capsule (100 mg total) by mouth 2 (two) times daily. Patient not taking: Reported on 08/02/2024 05/29/22   Jennetta Sayres D, NP  ferrous sulfate  325 (65 FE) MG tablet Take 1 tablet (325 mg total) by mouth 2 (two) times daily with a meal. Patient not taking: Reported on 08/02/2024 05/29/22   Bergman, Meghan D, NP  methocarbamol  (ROBAXIN ) 500 MG tablet Take 1 tablet (500 mg total) by mouth 2 (two) times daily as needed for muscle spasms. Patient not taking: Reported on 08/02/2024 11/18/22   Hazen Darryle BRAVO, FNP  ofloxacin  (OCUFLOX ) 0.3 % ophthalmic solution Place 1 drop into the left eye 4 (four) times daily. Patient not taking: Reported on 08/02/2024 09/18/22   Hazen Darryle BRAVO, FNP     Positive ROS: All other systems have been reviewed and were otherwise negative with the exception of those mentioned in the HPI and as above.  Physical Exam: General: Alert, no acute distress Cardiovascular: No pedal edema Respiratory: No cyanosis, no use of accessory musculature GI: abdomen soft Skin: No lesions in the area of  chief complaint Neurologic: Sensation intact distally Psychiatric: Patient is competent for consent with normal mood and affect Lymphatic: no lymphedema  MUSCULOSKELETAL: exam stable  Assessment: left knee osteoarthritis  Plan: Plan for Procedure(s): ARTHROPLASTY, KNEE, TOTAL  The risks benefits and alternatives were discussed with the patient including but not limited to the risks of nonoperative treatment, versus surgical intervention including infection, bleeding, nerve injury,  blood clots, cardiopulmonary complications, morbidity, mortality, among others, and they were willing to proceed.   Ozell Cummins, MD 08/07/2024 5:51 AM

## 2024-08-07 NOTE — Anesthesia Procedure Notes (Signed)
 Anesthesia Regional Block: Adductor canal block   Pre-Anesthetic Checklist: , timeout performed,  Correct Patient, Correct Site, Correct Laterality,  Correct Procedure, Correct Position, site marked,  Risks and benefits discussed,  Pre-op evaluation,  At surgeon's request and post-op pain management  Laterality: Left  Prep: Maximum Sterile Barrier Precautions used, chloraprep       Needles:  Injection technique: Single-shot  Needle Type: Echogenic Stimulator Needle     Needle Length: 9cm  Needle Gauge: 21     Additional Needles:   Procedures:,,,, ultrasound used (permanent image in chart),,    Narrative:  Start time: 08/07/2024 7:00 AM End time: 08/07/2024 7:04 AM Injection made incrementally with aspirations every 5 mL. Anesthesiologist: Niels Marien CROME, MD

## 2024-08-07 NOTE — Evaluation (Signed)
 Physical Therapy Evaluation Patient Details Name: Alyssa Valentine MRN: [id number] DOB: 09/03/1951 Today's Date: 08/07/2024  History of Present Illness  73 y.o. female presents to [facility] hospital on 08/07/2024 for elective L TKA. PMH includes HTN, insomnia.  Clinical Impression  Pt presents to PT with deficits in functional mobility, gait, balance, strength, ROM. Pt is able to ambulate for household distances with support of the RW. PT provides education on the TKR exercise packet and encourages frequent mobilization with staff assistance. PT will follow up tomorrow for a progression of gait and to initiate stair training.        If plan is discharge home, recommend the following: A little help with walking and/or transfers;A little help with bathing/dressing/bathroom;Assistance with cooking/housework;Assist for transportation;Help with stairs or ramp for entrance   Can travel by private vehicle        Equipment Recommendations None recommended by PT  Recommendations for Other Services       Functional Status Assessment Patient has had a recent decline in their functional status and demonstrates the ability to make significant improvements in function in a reasonable and predictable amount of time.     Precautions / Restrictions Precautions Precautions: Fall;Knee Precaution Booklet Issued: Yes (comment) Recall of Precautions/Restrictions: Intact Restrictions Weight Bearing Restrictions Per Provider Order: Yes LLE Weight Bearing Per Provider Order: Weight bearing as tolerated      Mobility  Bed Mobility Overal bed mobility: Needs Assistance Bed Mobility: Supine to Sit, Sit to Supine     Supine to sit: Contact guard Sit to supine: Supervision        Transfers Overall transfer level: Needs assistance Equipment used: Rolling walker (2 wheels) Transfers: Sit to/from Stand Sit to Stand: Min assist                Ambulation/Gait Ambulation/Gait assistance: Contact  guard assist Gait Distance (Feet): 150 Feet Assistive device: Rolling walker (2 wheels) Gait Pattern/deviations: Step-to pattern Gait velocity: reduced Gait velocity interpretation: <1.8 ft/sec, indicate of risk for recurrent falls   General Gait Details: slowed step-to gait, reduced stance time on LLE  Stairs            Wheelchair Mobility     Tilt Bed    Modified Rankin (Stroke Patients Only)       Balance Overall balance assessment: Needs assistance Sitting-balance support: No upper extremity supported, Feet supported Sitting balance-Leahy Scale: Good     Standing balance support: Bilateral upper extremity supported, Reliant on assistive device for balance Standing balance-Leahy Scale: Poor                               Pertinent Vitals/Pain Pain Assessment Pain Assessment: 0-10 Pain Score: 7  Pain Location: L knee Pain Descriptors / Indicators: Aching Pain Intervention(s): Monitored during session    Home Living Family/patient expects to be discharged to:: Private residence Living Arrangements: Spouse/significant other;Children Available Help at Discharge: Family;Available 24 hours/day Type of Home: House Home Access: Stairs to enter Entrance Stairs-Rails: Can reach both Entrance Stairs-Number of Steps: 7   Home Layout: Two level;Able to live on main level with bedroom/bathroom Home Equipment: Rolling Walker (2 wheels);Cane - single point;BSC/3in1;Shower seat      Prior Function Prior Level of Function : Independent/Modified Independent             Mobility Comments: ambulatory with SPC or RW       Extremity/Trunk Assessment   Upper  Extremity Assessment Upper Extremity Assessment: Overall WFL for tasks assessed    Lower Extremity Assessment Lower Extremity Assessment: LLE deficits/detail LLE Deficits / Details: generalized post-op strength and ROM deficits as anticipated on POD 0    Cervical / Trunk Assessment Cervical /  Trunk Assessment: Normal  Communication   Communication Communication: No apparent difficulties    Cognition Arousal: Alert Behavior During Therapy: WFL for tasks assessed/performed   PT - Cognitive impairments: No apparent impairments                         Following commands: Intact       Cueing Cueing Techniques: Verbal cues     General Comments General comments (skin integrity, edema, etc.): VSS on RA    Exercises Other Exercises Other Exercises: PT provides education on TKR exercise packet   Assessment/Plan    PT Assessment Patient needs continued PT services  PT Problem List Decreased strength;Decreased range of motion;Decreased activity tolerance;Decreased balance;Decreased mobility;Decreased knowledge of use of DME       PT Treatment Interventions DME instruction;Gait training;Stair training;Functional mobility training;Therapeutic activities;Therapeutic exercise;Balance training;Neuromuscular re-education;Patient/family education    PT Goals (Current goals can be found in the Care Plan section)  Acute Rehab PT Goals Patient Stated Goal: to return to independence PT Goal Formulation: With patient Time For Goal Achievement: 08/11/24 Potential to Achieve Goals: Good    Frequency 7X/week     Co-evaluation               AM-PAC PT 6 Clicks Mobility  Outcome Measure Help needed turning from your back to your side while in a flat bed without using bedrails?: A Little Help needed moving from lying on your back to sitting on the side of a flat bed without using bedrails?: A Little Help needed moving to and from a bed to a chair (including a wheelchair)?: A Little Help needed standing up from a chair using your arms (e.g., wheelchair or bedside chair)?: A Little Help needed to walk in hospital room?: A Little Help needed climbing 3-5 steps with a railing? : A Lot 6 Click Score: 17    End of Session Equipment Utilized During Treatment: Gait  belt Activity Tolerance: Patient tolerated treatment well Patient left: in bed;with call bell/phone within reach Nurse Communication: Mobility status PT Visit Diagnosis: Other abnormalities of gait and mobility (R26.89)    Time: 1440-1502 PT Time Calculation (min) (ACUTE ONLY): 22 min   Charges:   PT Evaluation $PT Eval Low Complexity: 1 Low   PT General Charges $$ ACUTE PT VISIT: 1 Visit         Bernardino JINNY Ruth, PT, DPT Acute Rehabilitation Office [phone]   Bernardino JINNY Ruth 08/07/2024, 3:16 PM

## 2024-08-07 NOTE — Transfer of Care (Signed)
 Immediate Anesthesia Transfer of Care Note  Patient: Alyssa Valentine  Procedure(s) Performed: ARTHROPLASTY, KNEE, TOTAL (Left: Knee)  Patient Location: PACU  Anesthesia Type:MAC and Spinal  Level of Consciousness: awake, alert , and oriented  Airway & Oxygen Therapy: Patient Spontanous Breathing and Patient connected to face mask oxygen  Post-op Assessment: Report given to RN and Post -op Vital signs reviewed and stable  Post vital signs: Reviewed and stable  Last Vitals:  Vitals Value Taken Time  BP 165/89 08/07/24 09:23  Temp    Pulse 94 08/07/24 09:26  Resp 16 08/07/24 09:26  SpO2 96 % 08/07/24 09:26  Vitals shown include unfiled device data.  Last Pain:  Vitals:   08/07/24 0618  PainSc: 10-Worst pain ever         Complications: No notable events documented.

## 2024-08-07 NOTE — Op Note (Signed)
 Total Knee Arthroplasty Procedure Note  Preoperative diagnosis: Left knee osteoarthritis  Postoperative diagnosis:same  Operative findings: Complete loss articular cartilage from lateral and patellofemoral compartments Flexion contracture, valgus deformity, intact collaterals Osteoporosis  Operative procedure: Left total knee arthroplasty. CPT [phone]  Surgeon: N. Ozell Cummins, MD  Assist: Ronal Morna Grave, PA-C; necessary for the timely completion of procedure and due to complexity of procedure.  Anesthesia: Spinal, regional, local  Tourniquet time: see anesthesia record  Implants used: Zimmer persona Femur: CR 7 narrow Tibia: D with 30 mm cemented stem Patella: 29 mm Polyethylene: 10 mm medial congruent  Indication: Alyssa Valentine is a 73 y.o. year old female with a history of knee pain. Having failed conservative management, the patient elected to proceed with a total knee arthroplasty.  We have reviewed the risk and benefits of the surgery and they elected to proceed after voicing understanding.  Procedure:  After informed consent was obtained and understanding of the risk were voiced including but not limited to bleeding, infection, damage to surrounding structures including nerves and vessels, blood clots, leg length inequality and the failure to achieve desired results, the operative extremity was marked with verbal confirmation of the patient in the holding area.   The patient was then brought to the operating room and transported to the operating room table in the supine position.  A tourniquet was applied to the operative extremity around the upper thigh. The operative limb was then prepped and draped in the usual sterile fashion and preoperative antibiotics were administered.  A time out was performed prior to the start of surgery confirming the correct extremity, preoperative antibiotic administration, as well as team members, implants and instruments available for  the case. Correct surgical site was also confirmed with preoperative radiographs. The limb was then elevated for exsanguination and the tourniquet was inflated. A midline incision was made and a standard medial parapatellar approach was performed.  The infrapatellar fat pad was removed.  Suprapatellar synovium was removed to reveal the anterior distal femoral cortex.  A medial peel was performed to release the capsule and the deep MCL off of the medial tibial plateau back to the semimembranosus.  The patella was then everted which showed complete loss of articular cartilage and was resected down to 13 mm and sized to a 29 mm.  A cover was placed on the patella for protection from retractors.  The knee was then brought into flexion and we then turned our attention to the femur.  The ACL was sacrificed.  Start site was drilled in the femur and the intramedullary distal femoral cutting guide was placed, set at 5 degrees valgus, taking 12 mm of distal resection. The distal cut was made. Osteophytes were then removed.  Next, the proximal tibial cutting guide was placed with appropriate slope, varus/valgus alignment and depth of resection.  The drop rod was attached to confirm that it was aimed at the second metatarsal.  The proximal tibial cut was made taking 2 mm off the low side. Gap blocks were then used to assess the extension gap and alignment, and appropriate soft tissue releases were performed.  An additional 2 mm of distal femur was taken in order to allow the knee to fully extend. Attention was turned back to the femur, which was sized using the sizing guide to a size 7. Appropriate rotation of the femoral component was determined using epicondylar axis, Whiteside's line, and assessing the flexion gap under ligament tension. The appropriate size 4-in-1 cutting  block was placed and checked with an angel wing and cuts were made. Posterior femoral osteophytes and uncapped bone were then removed with the curved  osteotome.  The menisci were removed.  Trial components were placed, and stability was checked in full extension, mid-flexion, and deep flexion.  PCL was resected to balance the flexion space. Proper tibial rotation was determined and marked.  The patella tracked well without a lateral release.  The femoral lugs were then drilled. Trial components were then removed and tibial preparation performed.  The trial tibia was pointed to the medial third of the tibial tubercle.  The tibia was sized for a size D component and prepared.  The bone was osteoporotic so the tibia was for a 30 mm cemented stem for additional fixation.  Trial components were removed.    The bony surfaces were irrigated with a pulse lavage and then dried. Bone cement was vacuum mixed on the back table, and the final components sized above were cemented into place.  Antibiotic irrigation was placed in the knee joint and soft tissues while the cement cured.  After cement had finished curing, excess cement was removed. The stability of the construct was re-evaluated throughout a range of motion and found to be acceptable. The trial liner was removed, the knee was copiously irrigated, and the knee was re-evaluated for any excess bone debris. The real polyethylene liner, 10 mm thick, was inserted and checked to ensure the locking mechanism had engaged appropriately. The tourniquet was deflated and hemostasis was achieved. The wound was irrigated with normal saline.  One gram of vancomycin  powder was placed in the surgical bed.  Topical mixture of 0.25% bupivacaine  and meloxicam  was placed in the joint for postoperative pain.  Capsular closure was performed with a #1 stratafix in flexion, subcutaneous fat closed with a 0 vicryl suture, then subcutaneous tissue closed with interrupted 2.0 vicryl suture. The skin was then closed with a 2.0 nylon and dermabond. A sterile dressing was applied.  The patient was awakened in the operating room and taken to  recovery in stable condition. All sponge, needle, and instrument counts were correct at the end of the case.  Morna Grave was necessary for opening, closing, retracting, limb positioning and overall facilitation and completion of the surgery.  Position: supine  Complications: none.  Time Out: performed   Drains/Packing: none Estimated blood loss: minimal Returned to Recovery Room: in good condition.   Mechanical VTE (DVT) Prophylaxis: sequential compression devices, TED thigh-high  Chemical VTE (DVT) Prophylaxis: eliquis  POD 1  Fluid Replacement  Crystalloid: see anesthesia record Blood: none  FFP: none   Specimens Removed: 1 to pathology  Sponge and Instrument Count Correct? yes  PACU: portable radiograph - knee AP and Lateral  Plan/RTC: Return in 2 weeks for suture removal.  Weight Bearing/Load Lower Extremity: full   Implant Name Type Inv. Item Serial No. Manufacturer Lot No. LRB No. Used Action  COMPONENT FEM CMT PERS SZ7 LT - [id number] Joint COMPONENT FEM CMT PERS SZ7 LT  ZIMMER RECON(ORTH,TRAU,BIO,SG) [id number] Left 1 Implanted  STEM POLY PAT PLY 38M KNEE - [id number] Knees STEM POLY PAT PLY 38M KNEE  ZIMMER RECON(ORTH,TRAU,BIO,SG) [id number] Left 1 Implanted  STEM TIBIA 5 DEG SZ D L KNEE - [id number] Knees STEM TIBIA 5 DEG SZ D L KNEE  ZIMMER RECON(ORTH,TRAU,BIO,SG) [id number] Left 1 Implanted  STEM TIB ST PERS 14+30 - [id number] Stem STEM TIB ST PERS 14+30  ZIMMER [employer]ON(ORTH,TRAU,BIO,SG) [id number] Left 1 Implanted  CEMENT BONE REFOBACIN  R1X40 US  - [id number] Cement CEMENT BONE REFOBACIN R1X40 US   ZIMMER RECON(ORTH,TRAU,BIO,SG) [id number] Left 1 Implanted  INSERT ARTISURF SZ 6-7 LT - [id number] Insert INSERT ARTISURF SZ 6-7 LT  ZIMMER RECON(ORTH,TRAU,BIO,SG) [id number] Left 1 Implanted    N. Ozell Cummins, MD [facility] 8:55 AM

## 2024-08-07 NOTE — Progress Notes (Signed)
 Orthopedic Tech Progress Note Patient Details:  Alyssa Valentine 07/15/1951 [id number]  Ortho Devices Type of Ortho Device: Bone foam zero knee Ortho Device/Splint Location: LLE Ortho Device/Splint Interventions: Ordered, Application, Removal   Post Interventions Patient Tolerated: Well Instructions Provided: Care of device  Delanna LITTIE Pac 08/07/2024, 10:48 AM

## 2024-08-07 NOTE — Discharge Instructions (Addendum)

## 2024-08-07 NOTE — Anesthesia Postprocedure Evaluation (Signed)
 Anesthesia Post Note  Patient: Alyssa Valentine  Procedure(s) Performed: ARTHROPLASTY, KNEE, TOTAL (Left: Knee)     Patient location during evaluation: PACU Anesthesia Type: Regional and Spinal Level of consciousness: oriented and awake and alert Pain management: pain level controlled Vital Signs Assessment: post-procedure vital signs reviewed and stable Respiratory status: spontaneous breathing, respiratory function stable and patient connected to nasal cannula oxygen Cardiovascular status: blood pressure returned to baseline and stable Postop Assessment: no headache, no backache and no apparent nausea or vomiting Anesthetic complications: no   No notable events documented.  Last Vitals:  Vitals:   08/07/24 0945 08/07/24 0953  BP: (!) 153/88 (!) 148/92  Pulse: 85 84  Resp: 19 19  Temp:  (!) 36.4 C  SpO2: 98% 97%    Last Pain:  Vitals:   08/07/24 0923  PainSc: Asleep                 Gennesis Hogland L [name]

## 2024-08-08 ENCOUNTER — Encounter (HOSPITAL_COMMUNITY): Payer: Self-pay | Admitting: Orthopaedic Surgery

## 2024-08-08 DIAGNOSIS — Z79899 Other long term (current) drug therapy: Secondary | ICD-10-CM | POA: Diagnosis not present

## 2024-08-08 DIAGNOSIS — I10 Essential (primary) hypertension: Secondary | ICD-10-CM | POA: Diagnosis not present

## 2024-08-08 DIAGNOSIS — M1712 Unilateral primary osteoarthritis, left knee: Secondary | ICD-10-CM | POA: Diagnosis not present

## 2024-08-08 DIAGNOSIS — Z7901 Long term (current) use of anticoagulants: Secondary | ICD-10-CM | POA: Diagnosis not present

## 2024-08-08 NOTE — Discharge Summary (Signed)
 Patient ID: Alyssa Valentine MRN: [id number] DOB/AGE: 74-Dec-1952 74 y.o.  Admit date: 08/07/2024 Discharge date: 08/08/2024  Admission Diagnoses:  Principal Problem:   Primary osteoarthritis of left knee Active Problems:   Status post total left knee replacement   Discharge Diagnoses:  Same  Past Medical History:  Diagnosis Date   Anemia    many years ago   Anxiety    Arthritis    Depression    GERD (gastroesophageal reflux disease)    Glaucoma    Headache    hx of migraines, no longer have them   HTN (hypertension)     Surgeries: Procedure(s): ARTHROPLASTY, KNEE, TOTAL on 08/07/2024   Consultants:   Discharged Condition: Improved  Hospital Course: DEBORRA PHEGLEY is an 73 y.o. female who was admitted 08/07/2024 for operative treatment ofPrimary osteoarthritis of left knee. Patient has severe unremitting pain that affects sleep, daily activities, and work/hobbies. After pre-op clearance the patient was taken to the operating room on 08/07/2024 and underwent  Procedure(s): ARTHROPLASTY, KNEE, TOTAL.    Patient was given perioperative antibiotics:  Anti-infectives (From admission, onward)    Start     Dose/Rate Route Frequency Ordered Stop   08/07/24 1400  ceFAZolin  (ANCEF ) IVPB 2g/100 mL premix        2 g 200 mL/hr over 30 Minutes Intravenous Every 6 hours 08/07/24 1034 08/07/24 2236   08/07/24 0848  vancomycin  (VANCOCIN ) powder  Status:  Discontinued          As needed 08/07/24 0848 08/07/24 0921   08/07/24 0600  ceFAZolin  (ANCEF ) IVPB 2g/100 mL premix        2 g 200 mL/hr over 30 Minutes Intravenous On call to O.R. 08/07/24 0556 08/07/24 0742   08/07/24 0559  ceFAZolin  (ANCEF ) 2-4 GM/100ML-% IVPB       Note to Pharmacy: Chauncey Rubin L: cabinet override      08/07/24 0559 08/07/24 0742        Patient was given sequential compression devices, early ambulation, and chemoprophylaxis to prevent DVT.  Inpatient Morphine  Milligram Equivalents Per Day 9/22 - 9/23    Values displayed are in units of MME/Day    Order Start / End Date Yesterday Today    oxyCODONE  (Oxy IR/ROXICODONE ) immediate release tablet 5 mg 9/22 - 9/22 0 of Unknown --    oxyCODONE  (ROXICODONE ) 5 MG/5ML solution 5 mg 9/22 - 9/22 0 of Unknown --      Group total: 0 of Unknown     fentaNYL  (SUBLIMAZE ) injection 25-50 mcg 9/22 - 9/22 0 of 45-90 --    oxyCODONE  (Oxy IR/ROXICODONE ) immediate release tablet 5 mg 9/22 - No end date 7.5 of 22.5 0 of 30    oxyCODONE  (Oxy IR/ROXICODONE ) immediate release tablet 10 mg 9/22 - No end date 15 of 45 30 of 60    HYDROmorphone  (DILAUDID ) injection 1 mg 9/22 - No end date 0 of 40 0 of 60    Daily Totals  22.5 of Unknown (at least 152.5-197.5) 30 of 150    Calculation Errors     Order Type Date Details   oxyCODONE  (Oxy IR/ROXICODONE ) immediate release tablet 5 mg Ordered Dose -- Insufficient frequency information   oxyCODONE  (ROXICODONE ) 5 MG/5ML solution 5 mg Ordered Dose -- Insufficient frequency information            Patient benefited maximally from hospital stay and there were no complications.    Recent vital signs: Patient Vitals for the past 24 hrs:  BP  Temp Temp src Pulse Resp SpO2  08/08/24 0746 120/84 98.5 F (36.9 C) Oral (!) 103 16 98 %  08/08/24 0328 (!) 107/52 98.6 F (37 C) Oral 88 18 97 %  08/07/24 2316 133/80 99 F (37.2 C) Oral (!) 102 18 95 %  08/07/24 1940 115/71 98.5 F (36.9 C) Oral 100 18 97 %  08/07/24 1515 118/72 98.4 F (36.9 C) Oral 99 19 100 %  08/07/24 1030 (!) 155/96 97.7 F (36.5 C) -- 88 19 100 %  08/07/24 0953 (!) 148/92 (!) 97.5 F (36.4 C) -- 84 19 97 %  08/07/24 0945 (!) 153/88 -- -- 85 19 98 %  08/07/24 0930 (!) 158/91 -- -- 88 20 95 %  08/07/24 0923 (!) 165/89 (!) 97.5 F (36.4 C) -- 93 16 95 %     Recent laboratory studies: No results for input(s): WBC, HGB, HCT, PLT, NA, K, CL, CO2, BUN, CREATININE, GLUCOSE, INR, CALCIUM  in the last 72 hours.  Invalid input(s):  PT, 2   Discharge Medications:   Allergies as of 08/08/2024       Reactions   Sulfa Antibiotics Itching, Swelling        Medication List     STOP taking these medications    acetaminophen  500 MG tablet Commonly known as: TYLENOL    tiZANidine 2 MG tablet Commonly known as: ZANAFLEX       TAKE these medications    amLODipine  5 MG tablet Commonly known as: NORVASC  Take 5 mg by mouth daily.   apixaban  2.5 MG Tabs tablet Commonly known as: Eliquis  Take 1 tablet (2.5 mg total) by mouth 2 (two) times daily for 30 days after surgery to prevent blood clots   atorvastatin  40 MG tablet Commonly known as: LIPITOR Take 40 mg by mouth every evening.   CALCIUM  PO Take 1 tablet by mouth daily.   diclofenac Sodium 1 % Gel Commonly known as: VOLTAREN Apply 1 Application topically 2 (two) times daily as needed (pain).   docusate sodium  100 MG capsule Commonly known as: Colace Take 1 capsule (100 mg total) by mouth daily as needed.   ferrous sulfate  325 (65 FE) MG tablet Take 1 tablet (325 mg total) by mouth 2 (two) times daily with a meal.   Humira (2 Pen) 40 MG/0.4ML pen Generic drug: adalimumab Inject 40 mg into the skin every 14 (fourteen) days.   hydrochlorothiazide  25 MG tablet Commonly known as: HYDRODIURIL  Take 25 mg by mouth daily.   latanoprost  0.005 % ophthalmic solution Commonly known as: XALATAN  Place 1 drop into both eyes at bedtime.   methocarbamol  750 MG tablet Commonly known as: ROBAXIN  Take 1 tablet (750 mg total) by mouth 3 (three) times daily as needed.   multivitamin with minerals Tabs tablet Take 1 tablet by mouth daily.   Nasacort Allergy 24HR 55 MCG/ACT Aero nasal inhaler Generic drug: triamcinolone  Place 2 sprays into the nose daily as needed (allergies).   ofloxacin  0.3 % ophthalmic solution Commonly known as: Ocuflox  Place 1 drop into the left eye 4 (four) times daily.   omeprazole 40 MG capsule Commonly known as:  PRILOSEC Take 40 mg by mouth daily.   ondansetron  4 MG tablet Commonly known as: Zofran  Take 1 tablet (4 mg total) by mouth every 8 (eight) hours as needed for nausea or vomiting.   oxyCODONE -acetaminophen  5-325 MG tablet Commonly known as: Percocet Take 1-2 tablets by mouth every 6 (six) hours as needed. To be taken after surgery  sertraline  100 MG tablet Commonly known as: ZOLOFT  Take 200 mg by mouth daily.   valsartan 320 MG tablet Commonly known as: DIOVAN Take 320 mg by mouth daily.   VITAMIN C  PO Take 1 tablet by mouth daily.   VITAMIN D-3 PO Take 1 capsule by mouth daily.   VITAMIN E PO Take 1 capsule by mouth daily.               Durable Medical Equipment  (From admission, onward)           Start     Ordered   08/07/24 1035  DME Walker rolling  Once       Question Answer Comment  Walker: With 5 Inch Wheels   Patient needs a walker to treat with the following condition Status post left partial knee replacement      08/07/24 1034   08/07/24 1035  DME 3 n 1  Once        08/07/24 1034   08/07/24 1035  DME Bedside commode  Once       Question:  Patient needs a bedside commode to treat with the following condition  Answer:  Status post left partial knee replacement   08/07/24 1034            Diagnostic Studies: DG Knee Left Port Result Date: 08/07/2024 CLINICAL DATA:  Postop, pain. EXAM: PORTABLE LEFT KNEE - 1-2 VIEW COMPARISON:  Preoperative radiographs. FINDINGS: Left knee arthroplasty in expected alignment. No periprosthetic lucency or fracture. There has been patellar resurfacing. Recent postsurgical change includes air and edema in the soft tissues and joint space. Prominent prepatellar soft tissue edema and gas. IMPRESSION: Left knee arthroplasty without immediate postoperative complication. Electronically Signed   By: Andrea Gasman M.D.   On: 08/07/2024 11:26   XR KNEE 3 VIEW LEFT Result Date: 07/21/2024 X-rays of the left knee show  advanced tricompartmental osteoarthritis.  Bone-on-bone joint space narrowing.  Kellgren-Lawrence stage IV.  Valgus deformity.   Disposition: Discharge disposition: 01-Home or Self Care          Follow-up Information     Jule Ronal CROME, PA-C. Schedule an appointment as soon as possible for a visit in 2 week(s).   Specialty: Orthopedic Surgery Contact information: [address] [place] [address] [phone]         [facility] Follow up.   Why: [facility] will contact you for the first home visit Contact information: [phone]                 Signed: Ronal CROME Jule 08/08/2024, 8:03 AM

## 2024-08-08 NOTE — Progress Notes (Signed)
 Physical Therapy Treatment  Patient Details Name: Alyssa Valentine MRN: [id number] DOB: Jul 13, 1951 Today's Date: 08/08/2024   History of Present Illness 73 y.o. female presents to [facility] hospital on 08/07/2024 for elective L TKA. PMH includes HTN, insomnia.    PT Comments  Pt progressing well with post-op mobility. Reviewed HEP, progressed gait distance and quality of gait pattern, and stair training. Pt anticipates d/c home today. Will continue to follow and progress as able per POC.     If plan is discharge home, recommend the following: A little help with walking and/or transfers;A little help with bathing/dressing/bathroom;Assistance with cooking/housework;Assist for transportation;Help with stairs or ramp for entrance   Can travel by private vehicle        Equipment Recommendations  None recommended by PT    Recommendations for Other Services       Precautions / Restrictions Precautions Precautions: Fall;Knee Precaution Booklet Issued: Yes (comment) Recall of Precautions/Restrictions: Intact Restrictions Weight Bearing Restrictions Per Provider Order: Yes LLE Weight Bearing Per Provider Order: Weight bearing as tolerated     Mobility  Bed Mobility Overal bed mobility: Modified Independent Bed Mobility: Supine to Sit, Sit to Supine           General bed mobility comments: No assist required. Good control of L LE to advance to/from EOB    Transfers Overall transfer level: Modified independent Equipment used: Rolling walker (2 wheels) Transfers: Sit to/from Stand             General transfer comment: No assist required. No unsteadiness or LOB noted.    Ambulation/Gait Ambulation/Gait assistance: Modified independent (Device/Increase time) Gait Distance (Feet): 300 Feet Assistive device: Rolling walker (2 wheels) Gait Pattern/deviations: Step-through pattern, Decreased stride length, Trunk flexed Gait velocity: Decreased Gait velocity interpretation: <1.8  ft/sec, indicate of risk for recurrent falls   General Gait Details: Able to progress to smooth step-through gait pattern. Good posture and use of walker throughout.   Stairs Stairs: Yes Stairs assistance: Contact guard assist Stair Management: Two rails, Step to pattern Number of Stairs: 6 General stair comments: VC's for sequencing and general safety.   Wheelchair Mobility     Tilt Bed    Modified Rankin (Stroke Patients Only)       Balance Overall balance assessment: Needs assistance Sitting-balance support: No upper extremity supported, Feet supported Sitting balance-Leahy Scale: Good     Standing balance support: Single extremity supported, During functional activity Standing balance-Leahy Scale: Fair Standing balance comment: statically for ADLs at the sink                            Communication Communication Communication: No apparent difficulties  Cognition Arousal: Alert Behavior During Therapy: WFL for tasks assessed/performed   PT - Cognitive impairments: No apparent impairments                         Following commands: Intact      Cueing Cueing Techniques: Verbal cues  Exercises Total Joint Exercises Ankle Circles/Pumps: 10 reps Quad Sets: 10 reps Short Arc Quad: 10 reps Heel Slides: 10 reps Hip ABduction/ADduction: 10 reps Straight Leg Raises: 10 reps Long Arc Quad: 10 reps Knee Flexion: 10 reps Goniometric ROM: 1-109    General Comments General comments (skin integrity, edema, etc.): VSS on RA      Pertinent Vitals/Pain Pain Assessment Pain Assessment: Faces Faces Pain Scale: Hurts a little bit Pain Location:  L knee Pain Descriptors / Indicators: Aching Pain Intervention(s): Limited activity within patient's tolerance, Monitored during session, Repositioned    Home Living Family/patient expects to be discharged to:: Private residence Living Arrangements: Spouse/significant other;Children Available Help  at Discharge: Family;Available 24 hours/day Type of Home: House Home Access: Stairs to enter Entrance Stairs-Rails: Can reach both Entrance Stairs-Number of Steps: 7 Alternate Level Stairs-Number of Steps: flight Home Layout: Two level;Able to live on main level with bedroom/bathroom Home Equipment: Rolling Walker (2 wheels);Cane - single point;BSC/3in1;Shower seat      Prior Function            PT Goals (current goals can now be found in the care plan section) Acute Rehab PT Goals Patient Stated Goal: to return to independence PT Goal Formulation: With patient Time For Goal Achievement: 08/11/24 Potential to Achieve Goals: Good Progress towards PT goals: Progressing toward goals    Frequency    7X/week      PT Plan      Co-evaluation              AM-PAC PT 6 Clicks Mobility   Outcome Measure  Help needed turning from your back to your side while in a flat bed without using bedrails?: None Help needed moving from lying on your back to sitting on the side of a flat bed without using bedrails?: None Help needed moving to and from a bed to a chair (including a wheelchair)?: None Help needed standing up from a chair using your arms (e.g., wheelchair or bedside chair)?: None Help needed to walk in hospital room?: None Help needed climbing 3-5 steps with a railing? : A Little 6 Click Score: 23    End of Session Equipment Utilized During Treatment: Gait belt Activity Tolerance: Patient tolerated treatment well Patient left: in bed;with call bell/phone within reach Nurse Communication: Mobility status PT Visit Diagnosis: Other abnormalities of gait and mobility (R26.89)     Time: 9155-9093 PT Time Calculation (min) (ACUTE ONLY): 22 min  Charges:      PT General Charges $$ ACUTE PT VISIT: 1 Visit                     Alyssa Valentine, PT, DPT Acute Rehabilitation Services Secure Chat Preferred Office: [phone]    Alyssa Valentine 08/08/2024, 10:30  AM

## 2024-08-08 NOTE — Evaluation (Signed)
 Occupational Therapy Evaluation Patient Details Name: Alyssa Valentine MRN: [id number] DOB: 1951/09/13 Today's Date: 08/08/2024   History of Present Illness   73 y.o. female presents to [facility] hospital on 08/07/2024 for elective L TKA. PMH includes HTN, insomnia.     Clinical Impressions Alyssa Valentine was evaluated s/p the above admission list. She is mod I at baseline. Upon evaluation, pt demonstrated mod I-supervision A ability to complete mobility and ADLs with RW. Pt does not require further acute, or follow up OT services. Recommend discharge back to pt's environment with assist as needed. OT to sign off with appreciation of order, please re-consult if needed.        If plan is discharge home, recommend the following:   Assistance with cooking/housework;Assist for transportation     Functional Status Assessment   Patient has had a recent decline in their functional status and demonstrates the ability to make significant improvements in function in a reasonable and predictable amount of time.     Equipment Recommendations   None recommended by OT     Recommendations for Other Services         Precautions/Restrictions   Precautions Precautions: Fall;Knee Precaution Booklet Issued: Yes (comment) Recall of Precautions/Restrictions: Intact Restrictions Weight Bearing Restrictions Per Provider Order: Yes LLE Weight Bearing Per Provider Order: Weight bearing as tolerated     Mobility Bed Mobility Overal bed mobility: Modified Independent                  Transfers Overall transfer level: Needs assistance Equipment used: Rolling walker (2 wheels) Transfers: Sit to/from Stand Sit to Stand: Supervision                  Balance Overall balance assessment: Needs assistance Sitting-balance support: No upper extremity supported, Feet supported Sitting balance-Leahy Scale: Good     Standing balance support: Single extremity supported, During functional  activity Standing balance-Leahy Scale: Fair Standing balance comment: statically for ADLs at the sink                           ADL either performed or assessed with clinical judgement   ADL Overall ADL's : Needs assistance/impaired Eating/Feeding: Independent   Grooming: Modified independent;Standing   Upper Body Bathing: Supervision/ safety;Standing   Lower Body Bathing: Supervison/ safety;Sit to/from stand   Upper Body Dressing : Sitting;Modified independent   Lower Body Dressing: Supervision/safety;Sit to/from stand   Toilet Transfer: Supervision/safety;Ambulation;Rolling walker (2 wheels);Regular Toilet   Toileting- Clothing Manipulation and Hygiene: Modified independent;Sitting/lateral lean       Functional mobility during ADLs: Supervision/safety;Rolling walker (2 wheels) General ADL Comments: no physical assist required, cues for compensatory techniques for LB ADLs     Vision Baseline Vision/History: 0 No visual deficits Patient Visual Report: No change from baseline Vision Assessment?: No apparent visual deficits     Perception Perception: Within Functional Limits       Praxis Praxis: WFL       Pertinent Vitals/Pain Pain Assessment Pain Assessment: 0-10 Pain Score: 7  Pain Location: L knee Pain Descriptors / Indicators: Aching Pain Intervention(s): Limited activity within patient's tolerance, Monitored during session     Extremity/Trunk Assessment Upper Extremity Assessment Upper Extremity Assessment: Overall WFL for tasks assessed   Lower Extremity Assessment Lower Extremity Assessment: Defer to PT evaluation   Cervical / Trunk Assessment Cervical / Trunk Assessment: Normal   Communication Communication Communication: No apparent difficulties   Cognition Arousal: Alert Behavior  During Therapy: WFL for tasks assessed/performed Cognition: No apparent impairments                               Following commands:  Intact       Cueing  General Comments   Cueing Techniques: Verbal cues  VSS on RA   Exercises     Shoulder Instructions      Home Living Family/patient expects to be discharged to:: Private residence Living Arrangements: Spouse/significant other;Children Available Help at Discharge: Family;Available 24 hours/day Type of Home: House Home Access: Stairs to enter [employer] of Steps: 7 Entrance Stairs-Rails: Can reach both Home Layout: Two level;Able to live on main level with bedroom/bathroom Alternate Level Stairs-Number of Steps: flight   Bathroom Shower/Tub: Chief Strategy Officer: Standard     Home Equipment: Agricultural consultant (2 wheels);Cane - single point;BSC/3in1;Shower seat          Prior Functioning/Environment Prior Level of Function : Independent/Modified Independent             Mobility Comments: ambulatory with SPC or RW ADLs Comments: mod I    OT Problem List: Decreased range of motion;Decreased activity tolerance;Impaired balance (sitting and/or standing);Decreased knowledge of use of DME or AE;Decreased safety awareness;Decreased knowledge of precautions   OT Treatment/Interventions:        OT Goals(Current goals can be found in the care plan section)   Acute Rehab OT Goals Patient Stated Goal: home OT Goal Formulation: With patient Time For Goal Achievement: 08/08/24 Potential to Achieve Goals: Good   OT Frequency:       Co-evaluation              AM-PAC OT 6 Clicks Daily Activity     Outcome Measure Help from another person eating meals?: None Help from another person taking care of personal grooming?: A Little Help from another person toileting, which includes using toliet, bedpan, or urinal?: A Little Help from another person bathing (including washing, rinsing, drying)?: A Little Help from another person to put on and taking off regular upper body clothing?: A Little Help from another person to put  on and taking off regular lower body clothing?: A Little 6 Click Score: 19   End of Session Equipment Utilized During Treatment: Rolling walker (2 wheels) Nurse Communication: Mobility status  Activity Tolerance: Patient tolerated treatment well Patient left: in bed;with call bell/phone within reach  OT Visit Diagnosis: Other abnormalities of gait and mobility (R26.89);Muscle weakness (generalized) (M62.81)                Time: 9184-9164 OT Time Calculation (min): 20 min Charges:  OT General Charges $OT Visit: 1 Visit OT Evaluation $OT Eval Moderate Complexity: 1 Mod  Lucie Kendall, OTR/L Acute Rehabilitation Services Office [phone] Secure Chat Communication Preferred   Lucie JONETTA Kendall 08/08/2024, 9:23 AM

## 2024-08-08 NOTE — Progress Notes (Addendum)
 Subjective: 1 Day Post-Op Procedure(s) (LRB): ARTHROPLASTY, KNEE, TOTAL (Left) Patient reports pain as mild.    Objective: Vital signs in last 24 hours: Temp:  [97.5 F (36.4 C)-99 F (37.2 C)] 98.5 F (36.9 C) (09/23 0746) Pulse Rate:  [84-103] 103 (09/23 0746) Resp:  [16-20] 16 (09/23 0746) BP: (107-165)/(52-96) 120/84 (09/23 0746) SpO2:  [95 %-100 %] 98 % (09/23 0746)  Intake/Output from previous day: 09/22 0701 - 09/23 0700 In: 2417.3 [P.O.:1200; I.V.:1017.3; IV Piggyback:200] Out: 225 [Urine:200; Blood:25] Intake/Output this shift: No intake/output data recorded.  No results for input(s): HGB in the last 72 hours. No results for input(s): WBC, RBC, HCT, PLT in the last 72 hours. No results for input(s): NA, K, CL, CO2, BUN, CREATININE, GLUCOSE, CALCIUM  in the last 72 hours. No results for input(s): LABPT, INR in the last 72 hours.  Neurologically intact Neurovascular intact Sensation intact distally Intact pulses distally Dorsiflexion/Plantar flexion intact Incision: moderate drainage No cellulitis present Compartment soft   Assessment/Plan: 1 Day Post-Op Procedure(s) (LRB): ARTHROPLASTY, KNEE, TOTAL (Left) Advance diet Up with therapy D/C IV fluids Discharge home with home health once cleared by PT WBAT LLE Will change aquacel prior to d/c.  Hold eliquis  this am      Ronal LITTIE Grave 08/08/2024, 8:00 AM

## 2024-08-08 NOTE — Care Management Obs Status (Signed)
 MEDICARE OBSERVATION STATUS NOTIFICATION   Patient Details  Name: Alyssa Valentine MRN: [id number] Date of Birth: 12/09/1950   Medicare Observation Status Notification Given:  Yes    Andrez JULIANNA George, RN 08/08/2024, 10:01 AM

## 2024-08-09 ENCOUNTER — Other Ambulatory Visit: Payer: Self-pay | Admitting: *Deleted

## 2024-08-09 DIAGNOSIS — E785 Hyperlipidemia, unspecified: Secondary | ICD-10-CM | POA: Diagnosis not present

## 2024-08-09 DIAGNOSIS — Z79620 Long term (current) use of immunosuppressive biologic: Secondary | ICD-10-CM | POA: Diagnosis not present

## 2024-08-09 DIAGNOSIS — Z7951 Long term (current) use of inhaled steroids: Secondary | ICD-10-CM | POA: Diagnosis not present

## 2024-08-09 DIAGNOSIS — B351 Tinea unguium: Secondary | ICD-10-CM | POA: Diagnosis not present

## 2024-08-09 DIAGNOSIS — Z96652 Presence of left artificial knee joint: Secondary | ICD-10-CM | POA: Diagnosis not present

## 2024-08-09 DIAGNOSIS — S52509D Unspecified fracture of the lower end of unspecified radius, subsequent encounter for closed fracture with routine healing: Secondary | ICD-10-CM | POA: Diagnosis not present

## 2024-08-09 DIAGNOSIS — Z79891 Long term (current) use of opiate analgesic: Secondary | ICD-10-CM | POA: Diagnosis not present

## 2024-08-09 DIAGNOSIS — Z604 Social exclusion and rejection: Secondary | ICD-10-CM | POA: Diagnosis not present

## 2024-08-09 DIAGNOSIS — M4150 Other secondary scoliosis, site unspecified: Secondary | ICD-10-CM | POA: Diagnosis not present

## 2024-08-09 DIAGNOSIS — I10 Essential (primary) hypertension: Secondary | ICD-10-CM | POA: Diagnosis not present

## 2024-08-09 DIAGNOSIS — M4316 Spondylolisthesis, lumbar region: Secondary | ICD-10-CM | POA: Diagnosis not present

## 2024-08-09 DIAGNOSIS — M069 Rheumatoid arthritis, unspecified: Secondary | ICD-10-CM | POA: Diagnosis not present

## 2024-08-09 DIAGNOSIS — Z7901 Long term (current) use of anticoagulants: Secondary | ICD-10-CM | POA: Diagnosis not present

## 2024-08-09 DIAGNOSIS — M1712 Unilateral primary osteoarthritis, left knee: Secondary | ICD-10-CM

## 2024-08-09 DIAGNOSIS — M1611 Unilateral primary osteoarthritis, right hip: Secondary | ICD-10-CM | POA: Diagnosis not present

## 2024-08-09 DIAGNOSIS — Z471 Aftercare following joint replacement surgery: Secondary | ICD-10-CM | POA: Diagnosis not present

## 2024-08-09 DIAGNOSIS — G4700 Insomnia, unspecified: Secondary | ICD-10-CM | POA: Diagnosis not present

## 2024-08-12 DIAGNOSIS — I10 Essential (primary) hypertension: Secondary | ICD-10-CM | POA: Diagnosis not present

## 2024-08-12 DIAGNOSIS — Z7901 Long term (current) use of anticoagulants: Secondary | ICD-10-CM | POA: Diagnosis not present

## 2024-08-12 DIAGNOSIS — S52509D Unspecified fracture of the lower end of unspecified radius, subsequent encounter for closed fracture with routine healing: Secondary | ICD-10-CM | POA: Diagnosis not present

## 2024-08-12 DIAGNOSIS — M1611 Unilateral primary osteoarthritis, right hip: Secondary | ICD-10-CM | POA: Diagnosis not present

## 2024-08-12 DIAGNOSIS — Z79620 Long term (current) use of immunosuppressive biologic: Secondary | ICD-10-CM | POA: Diagnosis not present

## 2024-08-12 DIAGNOSIS — M4316 Spondylolisthesis, lumbar region: Secondary | ICD-10-CM | POA: Diagnosis not present

## 2024-08-12 DIAGNOSIS — Z471 Aftercare following joint replacement surgery: Secondary | ICD-10-CM | POA: Diagnosis not present

## 2024-08-12 DIAGNOSIS — Z96652 Presence of left artificial knee joint: Secondary | ICD-10-CM | POA: Diagnosis not present

## 2024-08-12 DIAGNOSIS — E785 Hyperlipidemia, unspecified: Secondary | ICD-10-CM | POA: Diagnosis not present

## 2024-08-12 DIAGNOSIS — Z79891 Long term (current) use of opiate analgesic: Secondary | ICD-10-CM | POA: Diagnosis not present

## 2024-08-12 DIAGNOSIS — M069 Rheumatoid arthritis, unspecified: Secondary | ICD-10-CM | POA: Diagnosis not present

## 2024-08-12 DIAGNOSIS — G4700 Insomnia, unspecified: Secondary | ICD-10-CM | POA: Diagnosis not present

## 2024-08-12 DIAGNOSIS — Z7951 Long term (current) use of inhaled steroids: Secondary | ICD-10-CM | POA: Diagnosis not present

## 2024-08-12 DIAGNOSIS — B351 Tinea unguium: Secondary | ICD-10-CM | POA: Diagnosis not present

## 2024-08-12 DIAGNOSIS — M4150 Other secondary scoliosis, site unspecified: Secondary | ICD-10-CM | POA: Diagnosis not present

## 2024-08-12 DIAGNOSIS — Z604 Social exclusion and rejection: Secondary | ICD-10-CM | POA: Diagnosis not present

## 2024-08-14 DIAGNOSIS — S52509D Unspecified fracture of the lower end of unspecified radius, subsequent encounter for closed fracture with routine healing: Secondary | ICD-10-CM | POA: Diagnosis not present

## 2024-08-14 DIAGNOSIS — M1611 Unilateral primary osteoarthritis, right hip: Secondary | ICD-10-CM | POA: Diagnosis not present

## 2024-08-14 DIAGNOSIS — Z96652 Presence of left artificial knee joint: Secondary | ICD-10-CM | POA: Diagnosis not present

## 2024-08-14 DIAGNOSIS — Z471 Aftercare following joint replacement surgery: Secondary | ICD-10-CM | POA: Diagnosis not present

## 2024-08-14 DIAGNOSIS — Z79891 Long term (current) use of opiate analgesic: Secondary | ICD-10-CM | POA: Diagnosis not present

## 2024-08-14 DIAGNOSIS — Z79620 Long term (current) use of immunosuppressive biologic: Secondary | ICD-10-CM | POA: Diagnosis not present

## 2024-08-14 DIAGNOSIS — M4150 Other secondary scoliosis, site unspecified: Secondary | ICD-10-CM | POA: Diagnosis not present

## 2024-08-14 DIAGNOSIS — I10 Essential (primary) hypertension: Secondary | ICD-10-CM | POA: Diagnosis not present

## 2024-08-14 DIAGNOSIS — M4316 Spondylolisthesis, lumbar region: Secondary | ICD-10-CM | POA: Diagnosis not present

## 2024-08-14 DIAGNOSIS — E785 Hyperlipidemia, unspecified: Secondary | ICD-10-CM | POA: Diagnosis not present

## 2024-08-14 DIAGNOSIS — B351 Tinea unguium: Secondary | ICD-10-CM | POA: Diagnosis not present

## 2024-08-14 DIAGNOSIS — G4700 Insomnia, unspecified: Secondary | ICD-10-CM | POA: Diagnosis not present

## 2024-08-14 DIAGNOSIS — Z7951 Long term (current) use of inhaled steroids: Secondary | ICD-10-CM | POA: Diagnosis not present

## 2024-08-14 DIAGNOSIS — M069 Rheumatoid arthritis, unspecified: Secondary | ICD-10-CM | POA: Diagnosis not present

## 2024-08-14 DIAGNOSIS — Z7901 Long term (current) use of anticoagulants: Secondary | ICD-10-CM | POA: Diagnosis not present

## 2024-08-14 DIAGNOSIS — Z604 Social exclusion and rejection: Secondary | ICD-10-CM | POA: Diagnosis not present

## 2024-08-16 ENCOUNTER — Telehealth: Payer: Self-pay | Admitting: *Deleted

## 2024-08-16 ENCOUNTER — Other Ambulatory Visit: Payer: Self-pay | Admitting: Physician Assistant

## 2024-08-16 DIAGNOSIS — G4700 Insomnia, unspecified: Secondary | ICD-10-CM | POA: Diagnosis not present

## 2024-08-16 DIAGNOSIS — Z79891 Long term (current) use of opiate analgesic: Secondary | ICD-10-CM | POA: Diagnosis not present

## 2024-08-16 DIAGNOSIS — M4150 Other secondary scoliosis, site unspecified: Secondary | ICD-10-CM | POA: Diagnosis not present

## 2024-08-16 DIAGNOSIS — E785 Hyperlipidemia, unspecified: Secondary | ICD-10-CM | POA: Diagnosis not present

## 2024-08-16 DIAGNOSIS — Z7901 Long term (current) use of anticoagulants: Secondary | ICD-10-CM | POA: Diagnosis not present

## 2024-08-16 DIAGNOSIS — Z96652 Presence of left artificial knee joint: Secondary | ICD-10-CM | POA: Diagnosis not present

## 2024-08-16 DIAGNOSIS — S52509D Unspecified fracture of the lower end of unspecified radius, subsequent encounter for closed fracture with routine healing: Secondary | ICD-10-CM | POA: Diagnosis not present

## 2024-08-16 DIAGNOSIS — M069 Rheumatoid arthritis, unspecified: Secondary | ICD-10-CM | POA: Diagnosis not present

## 2024-08-16 DIAGNOSIS — Z471 Aftercare following joint replacement surgery: Secondary | ICD-10-CM | POA: Diagnosis not present

## 2024-08-16 DIAGNOSIS — I10 Essential (primary) hypertension: Secondary | ICD-10-CM | POA: Diagnosis not present

## 2024-08-16 DIAGNOSIS — B351 Tinea unguium: Secondary | ICD-10-CM | POA: Diagnosis not present

## 2024-08-16 DIAGNOSIS — M4316 Spondylolisthesis, lumbar region: Secondary | ICD-10-CM | POA: Diagnosis not present

## 2024-08-16 DIAGNOSIS — M1611 Unilateral primary osteoarthritis, right hip: Secondary | ICD-10-CM | POA: Diagnosis not present

## 2024-08-16 DIAGNOSIS — Z604 Social exclusion and rejection: Secondary | ICD-10-CM | POA: Diagnosis not present

## 2024-08-16 DIAGNOSIS — Z79620 Long term (current) use of immunosuppressive biologic: Secondary | ICD-10-CM | POA: Diagnosis not present

## 2024-08-16 DIAGNOSIS — Z7951 Long term (current) use of inhaled steroids: Secondary | ICD-10-CM | POA: Diagnosis not present

## 2024-08-16 MED ORDER — OXYCODONE-ACETAMINOPHEN 5-325 MG PO TABS: 1.0000 | ORAL_TABLET | Freq: Three times a day (TID) | ORAL | 0 refills | Status: AC | PRN

## 2024-08-16 NOTE — Telephone Encounter (Signed)
 Patient aware of change to medication schedule.

## 2024-08-16 NOTE — Telephone Encounter (Signed)
 Weaned to tid and sent to pharmacy on file

## 2024-08-16 NOTE — Telephone Encounter (Signed)
 Patient doing well a little over a week post op. Will need refill of pain medication sent to her pharmacy she requested. Thank you.

## 2024-08-18 DIAGNOSIS — Z96652 Presence of left artificial knee joint: Secondary | ICD-10-CM | POA: Diagnosis not present

## 2024-08-18 DIAGNOSIS — Z79891 Long term (current) use of opiate analgesic: Secondary | ICD-10-CM | POA: Diagnosis not present

## 2024-08-18 DIAGNOSIS — M4150 Other secondary scoliosis, site unspecified: Secondary | ICD-10-CM | POA: Diagnosis not present

## 2024-08-18 DIAGNOSIS — M069 Rheumatoid arthritis, unspecified: Secondary | ICD-10-CM | POA: Diagnosis not present

## 2024-08-18 DIAGNOSIS — E785 Hyperlipidemia, unspecified: Secondary | ICD-10-CM | POA: Diagnosis not present

## 2024-08-18 DIAGNOSIS — M1611 Unilateral primary osteoarthritis, right hip: Secondary | ICD-10-CM | POA: Diagnosis not present

## 2024-08-18 DIAGNOSIS — Z79620 Long term (current) use of immunosuppressive biologic: Secondary | ICD-10-CM | POA: Diagnosis not present

## 2024-08-18 DIAGNOSIS — S52509D Unspecified fracture of the lower end of unspecified radius, subsequent encounter for closed fracture with routine healing: Secondary | ICD-10-CM | POA: Diagnosis not present

## 2024-08-18 DIAGNOSIS — I10 Essential (primary) hypertension: Secondary | ICD-10-CM | POA: Diagnosis not present

## 2024-08-18 DIAGNOSIS — Z7951 Long term (current) use of inhaled steroids: Secondary | ICD-10-CM | POA: Diagnosis not present

## 2024-08-18 DIAGNOSIS — M4316 Spondylolisthesis, lumbar region: Secondary | ICD-10-CM | POA: Diagnosis not present

## 2024-08-18 DIAGNOSIS — G4700 Insomnia, unspecified: Secondary | ICD-10-CM | POA: Diagnosis not present

## 2024-08-18 DIAGNOSIS — Z471 Aftercare following joint replacement surgery: Secondary | ICD-10-CM | POA: Diagnosis not present

## 2024-08-18 DIAGNOSIS — Z7901 Long term (current) use of anticoagulants: Secondary | ICD-10-CM | POA: Diagnosis not present

## 2024-08-18 DIAGNOSIS — B351 Tinea unguium: Secondary | ICD-10-CM | POA: Diagnosis not present

## 2024-08-18 DIAGNOSIS — Z604 Social exclusion and rejection: Secondary | ICD-10-CM | POA: Diagnosis not present

## 2024-08-21 DIAGNOSIS — M4316 Spondylolisthesis, lumbar region: Secondary | ICD-10-CM | POA: Diagnosis not present

## 2024-08-21 DIAGNOSIS — G4700 Insomnia, unspecified: Secondary | ICD-10-CM | POA: Diagnosis not present

## 2024-08-21 DIAGNOSIS — Z471 Aftercare following joint replacement surgery: Secondary | ICD-10-CM | POA: Diagnosis not present

## 2024-08-21 DIAGNOSIS — B351 Tinea unguium: Secondary | ICD-10-CM | POA: Diagnosis not present

## 2024-08-21 DIAGNOSIS — E785 Hyperlipidemia, unspecified: Secondary | ICD-10-CM | POA: Diagnosis not present

## 2024-08-21 DIAGNOSIS — Z96652 Presence of left artificial knee joint: Secondary | ICD-10-CM | POA: Diagnosis not present

## 2024-08-21 DIAGNOSIS — Z7951 Long term (current) use of inhaled steroids: Secondary | ICD-10-CM | POA: Diagnosis not present

## 2024-08-21 DIAGNOSIS — M069 Rheumatoid arthritis, unspecified: Secondary | ICD-10-CM | POA: Diagnosis not present

## 2024-08-21 DIAGNOSIS — Z79891 Long term (current) use of opiate analgesic: Secondary | ICD-10-CM | POA: Diagnosis not present

## 2024-08-21 DIAGNOSIS — M4150 Other secondary scoliosis, site unspecified: Secondary | ICD-10-CM | POA: Diagnosis not present

## 2024-08-21 DIAGNOSIS — I10 Essential (primary) hypertension: Secondary | ICD-10-CM | POA: Diagnosis not present

## 2024-08-21 DIAGNOSIS — Z79620 Long term (current) use of immunosuppressive biologic: Secondary | ICD-10-CM | POA: Diagnosis not present

## 2024-08-21 DIAGNOSIS — Z7901 Long term (current) use of anticoagulants: Secondary | ICD-10-CM | POA: Diagnosis not present

## 2024-08-21 DIAGNOSIS — Z604 Social exclusion and rejection: Secondary | ICD-10-CM | POA: Diagnosis not present

## 2024-08-21 DIAGNOSIS — S52509D Unspecified fracture of the lower end of unspecified radius, subsequent encounter for closed fracture with routine healing: Secondary | ICD-10-CM | POA: Diagnosis not present

## 2024-08-21 DIAGNOSIS — M1611 Unilateral primary osteoarthritis, right hip: Secondary | ICD-10-CM | POA: Diagnosis not present

## 2024-08-22 ENCOUNTER — Ambulatory Visit: Admitting: Physician Assistant

## 2024-08-22 ENCOUNTER — Encounter: Admitting: Physician Assistant

## 2024-08-22 DIAGNOSIS — Z96652 Presence of left artificial knee joint: Secondary | ICD-10-CM

## 2024-08-22 NOTE — Progress Notes (Signed)
 Post-Op Visit Note   Patient: Alyssa Valentine           Date of Birth: [birth year]           MRN: [id number] Visit Date: 08/22/2024 PCP: Verdia Lombard, MD   Assessment & Plan:  Chief Complaint: No chief complaint on file.  Visit Diagnoses:  1. Status post total left knee replacement     Plan: Patient is a pleasant 73 year old female who comes in today 2 weeks status post left total knee replacement.  She has been doing well.  She has been taking oxycodone  for pain.  She has been compliant taking Eliquis  twice daily for DVT prophylaxis.  She has been getting home health PT and is ambulating with a walker.  Examination of her left knee reveals a well-healing surgical incision with nylon sutures in place.  No evidence of infection or cellulitis.  Calves are soft nontender.  She is neurovascularly intact distally.  Today, sutures were removed and Steri-Strips applied.  She is scheduled to start outpatient PT this week.  She will continue with her Eliquis  twice daily for another 2 weeks and then transition to a baby aspirin twice daily for 2 additional weeks for DVT prophylaxis.  Follow-up in 4 weeks for repeat evaluation and to be x-rays of the left knee.  Call with concerns or questions.  Follow-Up Instructions: Return in about 4 weeks (around 09/19/2024).   Orders:  No orders of the defined types were placed in this encounter.  No orders of the defined types were placed in this encounter.   Imaging: No new imaging  PMFS History: Patient Active Problem List   Diagnosis Date Noted   Status post total left knee replacement 08/07/2024   Primary osteoarthritis of left knee 08/06/2024   Primary osteoarthritis of right hip 06/20/2024   Degenerative scoliosis in adult patient 05/28/2022   Spondylolisthesis of lumbar region 08/26/2016   Radius distal fracture 01/04/2016   Tinea unguium 02/23/2014   Insomnia 02/23/2014   Other and unspecified hyperlipidemia 02/23/2014   HTN  (hypertension)    Past Medical History:  Diagnosis Date   Anemia    many years ago   Anxiety    Arthritis    Depression    GERD (gastroesophageal reflux disease)    Glaucoma    Headache    hx of migraines, no longer have them   HTN (hypertension)     No family history on file.  Past Surgical History:  Procedure Laterality Date   BACK SURGERY  2017   CARPAL TUNNEL RELEASE Right 07/17/1989   CARPAL TUNNEL RELEASE Left 01/04/2016   Procedure: CARPAL TUNNEL RELEASE;  Surgeon: Elsie Mussel, MD;  Location: [facility];  Service: Orthopedics;  Laterality: Left;   CESAREAN SECTION  11/17/1983   COLONOSCOPY N/A 09/15/2013   Procedure: COLONOSCOPY;  Surgeon: Belvie JONETTA Just, MD;  Location: [facility];  Service: Endoscopy;  Laterality: N/A;   ESOPHAGOGASTRODUODENOSCOPY N/A 09/15/2013   Procedure: ESOPHAGOGASTRODUODENOSCOPY (EGD);  Surgeon: Belvie JONETTA Just, MD;  Location: [facility];  Service: Endoscopy;  Laterality: N/A;   ORIF RADIAL FRACTURE Left 01/04/2016   Procedure: OPEN REDUCTION INTERNAL FIXATION (ORIF) LEFT RADIAL FRACTURE;  Surgeon: Elsie Mussel, MD;  Location: [facility];  Service: Orthopedics;  Laterality: Left;   ROTATOR CUFF REPAIR Bilateral 2010 and 2013   TOTAL KNEE ARTHROPLASTY Left 08/07/2024   Procedure: ARTHROPLASTY, KNEE, TOTAL;  Surgeon: Jerri Kay HERO, MD;  Location: [facility];  Service: Orthopedics;  Laterality: Left;  Social History   Occupational History   Not on file  Tobacco Use   Smoking status: Never   Smokeless tobacco: Never  Vaping Use   Vaping status: Never Used  Substance and Sexual Activity   Alcohol use: Yes    Comment: occasional   Drug use: No   Sexual activity: Not on file

## 2024-08-23 ENCOUNTER — Ambulatory Visit: Admitting: Physical Therapy

## 2024-08-23 ENCOUNTER — Encounter: Payer: Self-pay | Admitting: Physical Therapy

## 2024-08-23 DIAGNOSIS — M25562 Pain in left knee: Secondary | ICD-10-CM | POA: Diagnosis not present

## 2024-08-23 DIAGNOSIS — M25662 Stiffness of left knee, not elsewhere classified: Secondary | ICD-10-CM

## 2024-08-23 DIAGNOSIS — R2681 Unsteadiness on feet: Secondary | ICD-10-CM

## 2024-08-23 DIAGNOSIS — R600 Localized edema: Secondary | ICD-10-CM

## 2024-08-23 NOTE — Therapy (Signed)
 OUTPATIENT PHYSICAL THERAPY EVALUATION   Patient Name: Alyssa Valentine MRN: [id number] DOB:[birth year], 73 y.o., female Today's Date: 08/23/2024  END OF SESSION:  PT End of Session - 08/23/24 1116     Visit Number 1    Number of Visits 16    Date for Recertification  10/18/24    Authorization Type [employer] $20 copay    Progress Note Due on Visit 10    PT Start Time 1025   pt arrived late   PT Stop Time 1057    PT Time Calculation (min) 32 min    Activity Tolerance Patient tolerated treatment well    Behavior During Therapy WFL for tasks assessed/performed          Past Medical History:  Diagnosis Date   Anemia    many years ago   Anxiety    Arthritis    Depression    GERD (gastroesophageal reflux disease)    Glaucoma    Headache    hx of migraines, no longer have them   HTN (hypertension)    Past Surgical History:  Procedure Laterality Date   BACK SURGERY  2017   CARPAL TUNNEL RELEASE Right 07/17/1989   CARPAL TUNNEL RELEASE Left 01/04/2016   Procedure: CARPAL TUNNEL RELEASE;  Surgeon: Elsie Mussel, MD;  Location: [facility];  Service: Orthopedics;  Laterality: Left;   CESAREAN SECTION  11/17/1983   COLONOSCOPY N/A 09/15/2013   Procedure: COLONOSCOPY;  Surgeon: Belvie JONETTA Just, MD;  Location: [facility];  Service: Endoscopy;  Laterality: N/A;   ESOPHAGOGASTRODUODENOSCOPY N/A 09/15/2013   Procedure: ESOPHAGOGASTRODUODENOSCOPY (EGD);  Surgeon: Belvie JONETTA Just, MD;  Location: [facility];  Service: Endoscopy;  Laterality: N/A;   ORIF RADIAL FRACTURE Left 01/04/2016   Procedure: OPEN REDUCTION INTERNAL FIXATION (ORIF) LEFT RADIAL FRACTURE;  Surgeon: Elsie Mussel, MD;  Location: [facility];  Service: Orthopedics;  Laterality: Left;   ROTATOR CUFF REPAIR Bilateral 2010 and 2013   TOTAL KNEE ARTHROPLASTY Left 08/07/2024   Procedure: ARTHROPLASTY, KNEE, TOTAL;  Surgeon: Jerri Kay HERO, MD;  Location: [facility];  Service: Orthopedics;  Laterality: Left;   Patient Active Problem  List   Diagnosis Date Noted   Status post total left knee replacement 08/07/2024   Primary osteoarthritis of left knee 08/06/2024   Primary osteoarthritis of right hip 06/20/2024   Degenerative scoliosis in adult patient 05/28/2022   Spondylolisthesis of lumbar region 08/26/2016   Radius distal fracture 01/04/2016   Tinea unguium 02/23/2014   Insomnia 02/23/2014   Other and unspecified hyperlipidemia 02/23/2014   HTN (hypertension)     PCP: Verdia Lombard, MD  REFERRING PROVIDER: Jerri Kay HERO, MD  REFERRING DIAG: [phone] (ICD-10-CM) - Status post total left knee replacement M17.12 (ICD-10-CM) - Primary osteoarthritis of left knee  Rationale for Evaluation and Treatment: Rehabilitation  THERAPY DIAG:  Acute pain of left knee - Plan: PT plan of care cert/re-cert  Stiffness of left knee, not elsewhere classified - Plan: PT plan of care cert/re-cert  Unsteadiness on feet - Plan: PT plan of care cert/re-cert  Localized edema - Plan: PT plan of care cert/re-cert  ONSET DATE: 08/07/24   SUBJECTIVE:  SUBJECTIVE STATEMENT: Pt is s/p Lt TKA on 08/07/24. She had some HHPT and is here today amb with RW.  PERTINENT HISTORY:  HTN, insomnia, anxiety, depression, OA, glaucoma  PAIN:  Are you having pain? Yes: NPRS scale: 3 currently, up to 10, at best 0/10 Pain location: Lt knee Pain description: throbbing, aching, cramping Aggravating factors: bending the knee, occasionally walking Relieving factors: medication, ice  PRECAUTIONS:  None  RED FLAGS: None   WEIGHT BEARING RESTRICTIONS:  No  FALLS:  Has patient fallen in last 6 months? Yes. Number of falls 7; has neuropathy  LIVING ENVIRONMENT: Lives with: lives with their spouse and granddaughter (67 y/o) Lives in:  House/apartment Stairs: Yes: External: 7 steps; can reach both Has following equipment at home: Single point cane, Walker - 2 wheeled, shower chair, and bed side commode  OCCUPATION:  Retired - Probation officer, CNA and med tech  PLOF:  Independent and Leisure: walking, reading  PATIENT GOALS:  Walk and function like before   OBJECTIVE:  Note: Objective measures were completed at Evaluation unless otherwise noted.   PATIENT SURVEYS:  Patient-Specific Activity Scoring Scheme  0 represents "unable to perform." 10 represents "able to perform at prior level. 0 1 2 3 4 5 6 7 8 9  10 (Date and Score)   Activity Eval     1. Stairs 5    2. Walking 5    3. Bowling 0   Score 3.33    Total score = sum of the activity scores/number of activities Minimum detectable change (90%CI) for average score = 2 points Minimum detectable change (90%CI) for single activity score = 3 points    COGNITIVE STATUS: Within functional limits for tasks assessed   SENSATION: WFL  POSTURE:  rounded shoulders and forward head   GAIT: 08/23/24 Distance walked: 100' Assistive device utilized: Environmental consultant - 2 wheeled Level of assistance: Modified independence Comments: decreased stance on Lt, narrow BOS, decreased heel strike on Lt   EDEMA: 08/23/24  Ace wrap applied but visible edema and bruising in LLE   LOWER EXTREMITY ROM:     ROM Right eval Left eval  Knee flexion  A: 94 P: 106  Knee extension A: -2 (seated LAQ) A: -6 (seated LAQ) P: -2   (Blank rows = not tested)   LOWER EXTREMITY MMT:    08/23/24: Not formally tested; grossly 3-/5  MMT Right eval Left eval  Hip flexion    Hip extension    Hip abduction    Hip adduction    Hip internal rotation    Hip external rotation    Knee flexion    Knee extension    Ankle dorsiflexion    Ankle plantarflexion    Ankle inversion    Ankle eversion     (Blank rows = not tested)     FUNCTIONAL TESTS:  08/23/24 5 times sit  to stand: 16.5 sec with UE support; unable to rise without UE support   TREATMENT:  DATE:  08/23/24 TherEx See HEP - demonstrated with trial reps performed PRN, mod cues for comprehension    Self Care Educated on PT POC and clinical findings as well as goals of care     PATIENT EDUCATION:  Education details: HEP Person educated: Patient Education method: Programmer, multimedia, Demonstration, and Handouts Education comprehension: verbalized understanding, returned demonstration, and needs further education  HOME EXERCISE PROGRAM: Access Code: [email or web address] URL: [email or web address] Date: 08/23/2024 Prepared by: Corean Ku  Exercises - Supine Heel Slide with Strap  - 5-10 x daily - 7 x weekly - 1 sets - 5-10 reps - Long Sitting Quad Set  - 5-10 x daily - 7 x weekly - 1 sets - 5-10 reps - 5 sec hold - Seated Knee Extension AROM  - 5-10 x daily - 7 x weekly - 1 sets - 5-10 reps - 5 sec hold - Supine Knee Extension Mobilization with Weight  - 3-5 x daily - 7 x weekly - 1 sets - 1 reps - 3-5 min   ASSESSMENT:  CLINICAL IMPRESSION: Patient is a 72 y.o. female who was seen today for physical therapy evaluation and treatment for Lt TKA. She demonstrates decreasedb balance, ROM and strength as well as gait abnormalities and expected post op pain and swelling affecting functional mobility.  She will benefit from PT to address deficits listed.     OBJECTIVE IMPAIRMENTS: Abnormal gait, decreased balance, decreased mobility, difficulty walking, decreased ROM, decreased strength, hypomobility, increased edema, increased fascial restrictions, increased muscle spasms, and pain.   ACTIVITY LIMITATIONS: carrying, lifting, bending, standing, squatting, sleeping, stairs, transfers, and locomotion level  PARTICIPATION LIMITATIONS: meal prep, cleaning, laundry,  driving, shopping, and community activity  PERSONAL FACTORS: Age, Past/current experiences, Time since onset of injury/illness/exacerbation, and 3+ comorbidities: HTN, insomnia, anxiety, depression, OA, glaucoma, neuropathy are also affecting patient's functional outcome.   REHAB POTENTIAL: Good  CLINICAL DECISION MAKING: Evolving/moderate complexity  EVALUATION COMPLEXITY: Moderate   GOALS: Goals reviewed with patient? Yes  SHORT TERM GOALS: Target date: 09/20/2024   Independent with initial HEP Goal status: INITIAL  2.  Lt knee AROM improved to 0-2-105 for improved function and mobility Goal status: INITIAL   LONG TERM GOALS: Target date: 10/18/2024   Independent with final HEP Goal status: INITIAL  2.  PSFS score improved by 3 points Goal status: INITIAL  3.  Lt knee AROM improved to 0-110 for improved mobility and function Goal status: INIITAL  4.  Report pain < 3/10 with standing and walking activities for improved function Goal status: INITIAL  5.  Demonstrate ability to rise from chair without UE support for improved functional strength Goal status: INITIAL  6.  Amb with LRAD mod I for improved community access and mobility Goal status: INITIAL  7. 5x STS improved to < 14 sec with UE support for improved balance     PLAN:  PT FREQUENCY: 2x/week  PT DURATION: 8 weeks  PLANNED INTERVENTIONS: 97164- PT Re-evaluation, 97750- Physical Performance Testing, 97110-Therapeutic exercises, 97530- Therapeutic activity, 97112- Neuromuscular re-education, 97535- Self Care, 02859- Manual therapy, [id number]- Gait training, [phone]- Aquatic Therapy, [phone]- Electrical stimulation (unattended), 97016- Vasopneumatic device, 20560 (1-2 muscles), 20561 (3+ muscles)- Dry Needling, Patient/Family education, Balance training, Stair training, Taping, Joint mobilization, Scar mobilization, DME instructions, Cryotherapy, and Moist heat.  PLAN FOR NEXT SESSION: Review HEP, quad  activation, maximize flexion, functional strength and balance   NEXT MD VISIT: 09/19/24   Corean JULIANNA Ku, PT, DPT 08/23/24 11:20 AM  Date of referral: 08/09/24 Referring provider: Jerri Kay HERO, MD Referring diagnosis? S03.347 (ICD-10-CM) - Status post total left knee replacement M17.12 (ICD-10-CM) - Primary osteoarthritis of left knee Treatment diagnosis? (if different than referring diagnosis) M25.562, M25.662, R26.81, R60.0  What was this (referring dx) caused by? Surgery (Type: Left TKA) and Arthritis  Nature of Condition: Initial Onset (within last 3 months)   Laterality: Lt  Current Functional Measure Score: Patient Specific Functional Scale 3.33  Objective measurements identify impairments when they are compared to normal values, the uninvolved extremity, and prior level of function.  [x]  Yes  []  No  Objective assessment of functional ability: Moderate functional limitations   Briefly describe symptoms: Left TKA - pain, swelling, decreased balance, ROM, strength  How did symptoms start: surgery  Average pain intensity:  Last 24 hours: 5  Past week: 8  How often does the pt experience symptoms? Constantly  How much have the symptoms interfered with usual daily activities? Quite a bit  How has condition changed since care began at this facility? NA - initial visit  In general, how is the patients overall health? Very Good   BACK PAIN (STarT Back Screening Tool) No

## 2024-08-29 ENCOUNTER — Encounter: Admitting: Physician Assistant

## 2024-08-30 ENCOUNTER — Ambulatory Visit (INDEPENDENT_AMBULATORY_CARE_PROVIDER_SITE_OTHER)

## 2024-08-30 ENCOUNTER — Telehealth: Payer: Self-pay | Admitting: Physical Therapy

## 2024-08-30 ENCOUNTER — Encounter: Admitting: Rehabilitative and Restorative Service Providers"

## 2024-08-30 DIAGNOSIS — R2681 Unsteadiness on feet: Secondary | ICD-10-CM

## 2024-08-30 DIAGNOSIS — M25662 Stiffness of left knee, not elsewhere classified: Secondary | ICD-10-CM | POA: Diagnosis not present

## 2024-08-30 DIAGNOSIS — R600 Localized edema: Secondary | ICD-10-CM

## 2024-08-30 DIAGNOSIS — M25562 Pain in left knee: Secondary | ICD-10-CM | POA: Diagnosis not present

## 2024-08-30 NOTE — Telephone Encounter (Signed)
 Spoke to pt who thought her appt was at 3:00 this afternoon.  Offered later appt time today and pt accepted.   Corean JULIANNA Ku, PT, DPT 08/30/24 10:36 AM

## 2024-08-30 NOTE — Therapy (Signed)
 OUTPATIENT PHYSICAL THERAPY TREATMENT   Patient Name: Alyssa Valentine MRN: [id number] DOB:07-12-1951, 73 y.o., female Today's Date: 08/30/2024  END OF SESSION:  PT End of Session - 08/30/24 1449     Visit Number 2    Number of Visits 16    Date for Recertification  10/18/24    Authorization Type [employer] $20 copay    Progress Note Due on Visit 10    PT Start Time 1449    PT Stop Time 1541    PT Time Calculation (min) 52 min    Activity Tolerance Patient tolerated treatment well    Behavior During Therapy WFL for tasks assessed/performed           Past Medical History:  Diagnosis Date   Anemia    many years ago   Anxiety    Arthritis    Depression    GERD (gastroesophageal reflux disease)    Glaucoma    Headache    hx of migraines, no longer have them   HTN (hypertension)    Past Surgical History:  Procedure Laterality Date   BACK SURGERY  2017   CARPAL TUNNEL RELEASE Right 07/17/1989   CARPAL TUNNEL RELEASE Left 01/04/2016   Procedure: CARPAL TUNNEL RELEASE;  Surgeon: Elsie Mussel, MD;  Location: [facility];  Service: Orthopedics;  Laterality: Left;   CESAREAN SECTION  11/17/1983   COLONOSCOPY N/A 09/15/2013   Procedure: COLONOSCOPY;  Surgeon: Belvie JONETTA Just, MD;  Location: [facility];  Service: Endoscopy;  Laterality: N/A;   ESOPHAGOGASTRODUODENOSCOPY N/A 09/15/2013   Procedure: ESOPHAGOGASTRODUODENOSCOPY (EGD);  Surgeon: Belvie JONETTA Just, MD;  Location: [facility];  Service: Endoscopy;  Laterality: N/A;   ORIF RADIAL FRACTURE Left 01/04/2016   Procedure: OPEN REDUCTION INTERNAL FIXATION (ORIF) LEFT RADIAL FRACTURE;  Surgeon: Elsie Mussel, MD;  Location: [facility];  Service: Orthopedics;  Laterality: Left;   ROTATOR CUFF REPAIR Bilateral 2010 and 2013   TOTAL KNEE ARTHROPLASTY Left 08/07/2024   Procedure: ARTHROPLASTY, KNEE, TOTAL;  Surgeon: Jerri Kay HERO, MD;  Location: [facility];  Service: Orthopedics;  Laterality: Left;   Patient Active Problem List    Diagnosis Date Noted   Status post total left knee replacement 08/07/2024   Primary osteoarthritis of left knee 08/06/2024   Primary osteoarthritis of right hip 06/20/2024   Degenerative scoliosis in adult patient 05/28/2022   Spondylolisthesis of lumbar region 08/26/2016   Radius distal fracture 01/04/2016   Tinea unguium 02/23/2014   Insomnia 02/23/2014   Other and unspecified hyperlipidemia 02/23/2014   HTN (hypertension)     PCP: Verdia Lombard, MD  REFERRING PROVIDER: Jerri Kay HERO, MD  REFERRING DIAG: [phone] (ICD-10-CM) - Status post total left knee replacement M17.12 (ICD-10-CM) - Primary osteoarthritis of left knee  Rationale for Evaluation and Treatment: Rehabilitation  THERAPY DIAG:  Acute pain of left knee  Stiffness of left knee, not elsewhere classified  Unsteadiness on feet  Localized edema  ONSET DATE: 08/07/24   SUBJECTIVE:  SUBJECTIVE STATEMENT: Patient reports no pain this date.  PERTINENT HISTORY:  HTN, insomnia, anxiety, depression, OA, glaucoma  Pt is s/p Lt TKA on 08/07/24. She had some HHPT and is here today amb with RW.  PAIN:  NPRS scale: currently 0/10 Pain location: Lt knee Pain description: throbbing, aching, cramping Aggravating factors: bending the knee, occasionally walking Relieving factors: medication, ice  PRECAUTIONS:  None  RED FLAGS: None   WEIGHT BEARING RESTRICTIONS:  No  FALLS:  Has patient fallen in last 6 months? Yes. Number of falls 7; has neuropathy  LIVING ENVIRONMENT: Lives with: lives with their spouse and granddaughter (65 y/o) Lives in: House/apartment Stairs: Yes: External: 7 steps; can reach both Has following equipment at home: Single point cane, Walker - 2 wheeled, shower chair, and bed side commode  OCCUPATION:   Retired - Probation officer, CNA and med tech  PLOF:  Independent and Leisure: walking, reading  PATIENT GOALS:  Walk and function like before   OBJECTIVE:  Note: Objective measures were completed at Evaluation unless otherwise noted.   PATIENT SURVEYS:  Patient-Specific Activity Scoring Scheme  0 represents "unable to perform." 10 represents "able to perform at prior level. 0 1 2 3 4 5 6 7 8 9  10 (Date and Score)   Activity Eval     1. Stairs 5    2. Walking 5    3. Bowling 0   Score 3.33    Total score = sum of the activity scores/number of activities Minimum detectable change (90%CI) for average score = 2 points Minimum detectable change (90%CI) for single activity score = 3 points    COGNITIVE STATUS: 08/23/24 Within functional limits for tasks assessed   SENSATION: 08/23/24 WFL  POSTURE:  08/23/24 rounded shoulders and forward head   GAIT: 08/23/24 Distance walked: 100' Assistive device utilized: Environmental consultant - 2 wheeled Level of assistance: Modified independence Comments: decreased stance on Lt, narrow BOS, decreased heel strike on Lt   EDEMA: 08/23/24  Ace wrap applied but visible edema and bruising in LLE   LOWER EXTREMITY ROM:     ROM Right Eval 08/23/24 Left Eval 08/23/24  Knee flexion  A: 94 P: 106  Knee extension A: -2 (seated LAQ) A: -6 (seated LAQ) P: -2   (Blank rows = not tested)   LOWER EXTREMITY MMT:    08/23/24: Not formally tested; grossly 3-/5  MMT Right eval Left eval  Hip flexion    Hip extension    Hip abduction    Hip adduction    Hip internal rotation    Hip external rotation    Knee flexion    Knee extension    Ankle dorsiflexion    Ankle plantarflexion    Ankle inversion    Ankle eversion     (Blank rows = not tested)     FUNCTIONAL TESTS:  08/23/24 5 times sit to stand: 16.5 sec with UE support; unable to rise without UE support                    TREATMENT       DATE: 08/30/2024 Therex: UBE  with bilat LE level 2 for 8 minutes , full fwd revolutions at seat 9 Slant board gastroc stretch 3x30s  Seated LAQ with 1# ankle weight 2x10 with 2-3s hold  Standing TKE 2x10 with 2s hold  Supine heel slides x20 total   Manual: Seated knee flexion with PT providing IR/distraction and slight overpressure into knee flexion 1x10  with 10s hold and PT providing PROM knee extension in between   Vaso: Lt knee elevated on wedge with medium compression for 10 minutes at 34deg    TREATMENT       DATE: 08/23/24 TherEx See HEP - demonstrated with trial reps performed PRN, mod cues for comprehension    Self Care Educated on PT POC and clinical findings as well as goals of care     PATIENT EDUCATION:  Education details: HEP Person educated: Patient Education method: Programmer, multimedia, Facilities manager, and Handouts Education comprehension: verbalized understanding, returned demonstration, and needs further education  HOME EXERCISE PROGRAM: Access Code: [email or web address] URL: [email or web address] Date: 08/23/2024 Prepared by: Corean Ku  Exercises - Supine Heel Slide with Strap  - 5-10 x daily - 7 x weekly - 1 sets - 5-10 reps - Long Sitting Quad Set  - 5-10 x daily - 7 x weekly - 1 sets - 5-10 reps - 5 sec hold - Seated Knee Extension AROM  - 5-10 x daily - 7 x weekly - 1 sets - 5-10 reps - 5 sec hold - Supine Knee Extension Mobilization with Weight  - 3-5 x daily - 7 x weekly - 1 sets - 1 reps - 3-5 min   ASSESSMENT:  CLINICAL IMPRESSION: Patient arrived to session noting no pain and doing well. Patient tolerated all activities this date with only slight increase in pain/soreness when performing LAQ. Patient will continue to benefit from skilled PT.     OBJECTIVE IMPAIRMENTS: Abnormal gait, decreased balance, decreased mobility, difficulty walking, decreased ROM, decreased strength, hypomobility, increased edema, increased fascial restrictions, increased muscle spasms, and  pain.   ACTIVITY LIMITATIONS: carrying, lifting, bending, standing, squatting, sleeping, stairs, transfers, and locomotion level  PARTICIPATION LIMITATIONS: meal prep, cleaning, laundry, driving, shopping, and community activity  PERSONAL FACTORS: Age, Past/current experiences, Time since onset of injury/illness/exacerbation, and 3+ comorbidities: HTN, insomnia, anxiety, depression, OA, glaucoma, neuropathy are also affecting patient's functional outcome.   REHAB POTENTIAL: Good  CLINICAL DECISION MAKING: Evolving/moderate complexity  EVALUATION COMPLEXITY: Moderate   GOALS: Goals reviewed with patient? Yes  SHORT TERM GOALS: Target date: 09/20/2024   Independent with initial HEP Goal status: on going 08/30/2024  2.  Lt knee AROM improved to 0-2-105 for improved function and mobility Goal status: on going 08/30/2024   LONG TERM GOALS: Target date: 10/18/2024   Independent with final HEP Goal status: INITIAL  2.  PSFS score improved by 3 points Goal status: INITIAL  3.  Lt knee AROM improved to 0-110 for improved mobility and function Goal status: INIITAL  4.  Report pain < 3/10 with standing and walking activities for improved function Goal status: INITIAL  5.  Demonstrate ability to rise from chair without UE support for improved functional strength Goal status: INITIAL  6.  Amb with LRAD mod I for improved community access and mobility Goal status: INITIAL  7. 5x STS improved to < 14 sec with UE support for improved balance     PLAN:  PT FREQUENCY: 2x/week  PT DURATION: 8 weeks  PLANNED INTERVENTIONS: 97164- PT Re-evaluation, 97750- Physical Performance Testing, 97110-Therapeutic exercises, 97530- Therapeutic activity, 97112- Neuromuscular re-education, 97535- Self Care, 02859- Manual therapy, [id number]- Gait training, [phone]- Aquatic Therapy, [phone]- Electrical stimulation (unattended), 97016- Vasopneumatic device, 20560 (1-2 muscles), 20561 (3+ muscles)- Dry  Needling, Patient/Family education, Balance training, Stair training, Taping, Joint mobilization, Scar mobilization, DME instructions, Cryotherapy, and Moist heat.  PLAN FOR NEXT SESSION: Review HEP, quad activation, maximize flexion, functional strength and  balance   NEXT MD VISIT: 09/19/24  Susannah Daring, PT, DPT 08/30/24 3:47 PM     Date of referral: 08/09/24 Referring provider: Jerri Kay HERO, MD Referring diagnosis? S03.347 (ICD-10-CM) - Status post total left knee replacement M17.12 (ICD-10-CM) - Primary osteoarthritis of left knee Treatment diagnosis? (if different than referring diagnosis) M25.562, M25.662, R26.81, R60.0  What was this (referring dx) caused by? Surgery (Type: Left TKA) and Arthritis  Nature of Condition: Initial Onset (within last 3 months)   Laterality: Lt  Current Functional Measure Score: Patient Specific Functional Scale 3.33  Objective measurements identify impairments when they are compared to normal values, the uninvolved extremity, and prior level of function.  [x]  Yes  []  No  Objective assessment of functional ability: Moderate functional limitations   Briefly describe symptoms: Left TKA - pain, swelling, decreased balance, ROM, strength  How did symptoms start: surgery  Average pain intensity:  Last 24 hours: 5  Past week: 8  How often does the pt experience symptoms? Constantly  How much have the symptoms interfered with usual daily activities? Quite a bit  How has condition changed since care began at this facility? NA - initial visit  In general, how is the patients overall health? Very Good   BACK PAIN (STarT Back Screening Tool) No

## 2024-08-30 NOTE — Therapy (Incomplete)
 OUTPATIENT PHYSICAL THERAPY TREATMENT   Patient Name: Alyssa Valentine MRN: [id number] DOB:1951-07-10, 73 y.o., female Today's Date: 08/30/2024  END OF SESSION:    Past Medical History:  Diagnosis Date   Anemia    many years ago   Anxiety    Arthritis    Depression    GERD (gastroesophageal reflux disease)    Glaucoma    Headache    hx of migraines, no longer have them   HTN (hypertension)    Past Surgical History:  Procedure Laterality Date   BACK SURGERY  2017   CARPAL TUNNEL RELEASE Right 07/17/1989   CARPAL TUNNEL RELEASE Left 01/04/2016   Procedure: CARPAL TUNNEL RELEASE;  Surgeon: Elsie Mussel, MD;  Location: [facility];  Service: Orthopedics;  Laterality: Left;   CESAREAN SECTION  11/17/1983   COLONOSCOPY N/A 09/15/2013   Procedure: COLONOSCOPY;  Surgeon: Belvie JONETTA Just, MD;  Location: [facility];  Service: Endoscopy;  Laterality: N/A;   ESOPHAGOGASTRODUODENOSCOPY N/A 09/15/2013   Procedure: ESOPHAGOGASTRODUODENOSCOPY (EGD);  Surgeon: Belvie JONETTA Just, MD;  Location: [facility];  Service: Endoscopy;  Laterality: N/A;   ORIF RADIAL FRACTURE Left 01/04/2016   Procedure: OPEN REDUCTION INTERNAL FIXATION (ORIF) LEFT RADIAL FRACTURE;  Surgeon: Elsie Mussel, MD;  Location: [facility];  Service: Orthopedics;  Laterality: Left;   ROTATOR CUFF REPAIR Bilateral 2010 and 2013   TOTAL KNEE ARTHROPLASTY Left 08/07/2024   Procedure: ARTHROPLASTY, KNEE, TOTAL;  Surgeon: Jerri Kay HERO, MD;  Location: [facility];  Service: Orthopedics;  Laterality: Left;   Patient Active Problem List   Diagnosis Date Noted   Status post total left knee replacement 08/07/2024   Primary osteoarthritis of left knee 08/06/2024   Primary osteoarthritis of right hip 06/20/2024   Degenerative scoliosis in adult patient 05/28/2022   Spondylolisthesis of lumbar region 08/26/2016   Radius distal fracture 01/04/2016   Tinea unguium 02/23/2014   Insomnia 02/23/2014   Other and unspecified hyperlipidemia 02/23/2014    HTN (hypertension)     PCP: Verdia Lombard, MD  REFERRING PROVIDER: Jerri Kay HERO, MD  REFERRING DIAG: [phone] (ICD-10-CM) - Status post total left knee replacement M17.12 (ICD-10-CM) - Primary osteoarthritis of left knee  Rationale for Evaluation and Treatment: Rehabilitation  THERAPY DIAG:  No diagnosis found.  ONSET DATE: 08/07/24   SUBJECTIVE:                                                                                                                                                                                           SUBJECTIVE STATEMENT: Pt is s/p Lt TKA on 08/07/24. She had some HHPT and is here today amb with  RW.  PERTINENT HISTORY:  HTN, insomnia, anxiety, depression, OA, glaucoma  PAIN:  NPRS scale: 3 currently, up to 10, at best 0/10 Pain location: Lt knee Pain description: throbbing, aching, cramping Aggravating factors: bending the knee, occasionally walking Relieving factors: medication, ice  PRECAUTIONS:  None  RED FLAGS: None   WEIGHT BEARING RESTRICTIONS:  No  FALLS:  Has patient fallen in last 6 months? Yes. Number of falls 7; has neuropathy  LIVING ENVIRONMENT: Lives with: lives with their spouse and granddaughter (63 y/o) Lives in: House/apartment Stairs: Yes: External: 7 steps; can reach both Has following equipment at home: Single point cane, Walker - 2 wheeled, shower chair, and bed side commode  OCCUPATION:  Retired - Probation officer, CNA and med tech  PLOF:  Independent and Leisure: walking, reading  PATIENT GOALS:  Walk and function like before   OBJECTIVE:  Note: Objective measures were completed at Evaluation unless otherwise noted.   PATIENT SURVEYS:  Patient-Specific Activity Scoring Scheme  0 represents "unable to perform." 10 represents "able to perform at prior level. 0 1 2 3 4 5 6 7 8 9  10 (Date and Score)   Activity Eval     1. Stairs 5    2. Walking 5    3. Bowling 0   Score 3.33     Total score = sum of the activity scores/number of activities Minimum detectable change (90%CI) for average score = 2 points Minimum detectable change (90%CI) for single activity score = 3 points    COGNITIVE STATUS: 08/23/24 Within functional limits for tasks assessed   SENSATION: 08/23/24 WFL  POSTURE:  08/23/24 rounded shoulders and forward head   GAIT: 08/23/24 Distance walked: 100' Assistive device utilized: Environmental consultant - 2 wheeled Level of assistance: Modified independence Comments: decreased stance on Lt, narrow BOS, decreased heel strike on Lt   EDEMA: 08/23/24  Ace wrap applied but visible edema and bruising in LLE   LOWER EXTREMITY ROM:     ROM Right Eval 08/23/24 Left Eval 08/23/24  Knee flexion  A: 94 P: 106  Knee extension A: -2 (seated LAQ) A: -6 (seated LAQ) P: -2   (Blank rows = not tested)   LOWER EXTREMITY MMT:    08/23/24: Not formally tested; grossly 3-/5  MMT Right eval Left eval  Hip flexion    Hip extension    Hip abduction    Hip adduction    Hip internal rotation    Hip external rotation    Knee flexion    Knee extension    Ankle dorsiflexion    Ankle plantarflexion    Ankle inversion    Ankle eversion     (Blank rows = not tested)     FUNCTIONAL TESTS:  08/23/24 5 times sit to stand: 16.5 sec with UE support; unable to rise without UE support                    TREATMENT       DATE: 08/30/2024 Therex:  Manual:    Vaso:    TREATMENT       DATE: 08/23/24 TherEx See HEP - demonstrated with trial reps performed PRN, mod cues for comprehension    Self Care Educated on PT POC and clinical findings as well as goals of care     PATIENT EDUCATION:  Education details: HEP Person educated: Patient Education method: Programmer, multimedia, Facilities manager, and Handouts Education comprehension: verbalized understanding, returned demonstration, and needs further education  HOME EXERCISE PROGRAM:  Access Code: [email or web address] URL:  [email or web address] Date: 08/23/2024 Prepared by: Corean Ku  Exercises - Supine Heel Slide with Strap  - 5-10 x daily - 7 x weekly - 1 sets - 5-10 reps - Long Sitting Quad Set  - 5-10 x daily - 7 x weekly - 1 sets - 5-10 reps - 5 sec hold - Seated Knee Extension AROM  - 5-10 x daily - 7 x weekly - 1 sets - 5-10 reps - 5 sec hold - Supine Knee Extension Mobilization with Weight  - 3-5 x daily - 7 x weekly - 1 sets - 1 reps - 3-5 min   ASSESSMENT:  CLINICAL IMPRESSION: ***    OBJECTIVE IMPAIRMENTS: Abnormal gait, decreased balance, decreased mobility, difficulty walking, decreased ROM, decreased strength, hypomobility, increased edema, increased fascial restrictions, increased muscle spasms, and pain.   ACTIVITY LIMITATIONS: carrying, lifting, bending, standing, squatting, sleeping, stairs, transfers, and locomotion level  PARTICIPATION LIMITATIONS: meal prep, cleaning, laundry, driving, shopping, and community activity  PERSONAL FACTORS: Age, Past/current experiences, Time since onset of injury/illness/exacerbation, and 3+ comorbidities: HTN, insomnia, anxiety, depression, OA, glaucoma, neuropathy are also affecting patient's functional outcome.   REHAB POTENTIAL: Good  CLINICAL DECISION MAKING: Evolving/moderate complexity  EVALUATION COMPLEXITY: Moderate   GOALS: Goals reviewed with patient? Yes  SHORT TERM GOALS: Target date: 09/20/2024   Independent with initial HEP Goal status: on going 08/30/2024  2.  Lt knee AROM improved to 0-2-105 for improved function and mobility Goal status: on going 08/30/2024   LONG TERM GOALS: Target date: 10/18/2024   Independent with final HEP Goal status: INITIAL  2.  PSFS score improved by 3 points Goal status: INITIAL  3.  Lt knee AROM improved to 0-110 for improved mobility and function Goal status: INIITAL  4.  Report pain < 3/10 with standing and walking activities for improved function Goal  status: INITIAL  5.  Demonstrate ability to rise from chair without UE support for improved functional strength Goal status: INITIAL  6.  Amb with LRAD mod I for improved community access and mobility Goal status: INITIAL  7. 5x STS improved to < 14 sec with UE support for improved balance     PLAN:  PT FREQUENCY: 2x/week  PT DURATION: 8 weeks  PLANNED INTERVENTIONS: 97164- PT Re-evaluation, 97750- Physical Performance Testing, 97110-Therapeutic exercises, 97530- Therapeutic activity, 97112- Neuromuscular re-education, 97535- Self Care, 02859- Manual therapy, [id number]- Gait training, [phone]- Aquatic Therapy, [phone]- Electrical stimulation (unattended), 97016- Vasopneumatic device, 20560 (1-2 muscles), 20561 (3+ muscles)- Dry Needling, Patient/Family education, Balance training, Stair training, Taping, Joint mobilization, Scar mobilization, DME instructions, Cryotherapy, and Moist heat.  PLAN FOR NEXT SESSION: Review HEP, quad activation, maximize flexion, functional strength and balance   NEXT MD VISIT: 09/19/24  Ozell Silvan, PT, DPT, OCS, ATC 08/30/24  12:04 PM     Date of referral: 08/09/24 Referring provider: Jerri Kay HERO, MD Referring diagnosis? S03.347 (ICD-10-CM) - Status post total left knee replacement M17.12 (ICD-10-CM) - Primary osteoarthritis of left knee Treatment diagnosis? (if different than referring diagnosis) M25.562, M25.662, R26.81, R60.0  What was this (referring dx) caused by? Surgery (Type: Left TKA) and Arthritis  Nature of Condition: Initial Onset (within last 3 months)   Laterality: Lt  Current Functional Measure Score: Patient Specific Functional Scale 3.33  Objective measurements identify impairments when they are compared to normal values, the uninvolved extremity, and prior level of function.  [x]  Yes  []  No  Objective assessment of functional ability: Moderate functional limitations  Briefly describe symptoms: Left TKA - pain, swelling,  decreased balance, ROM, strength  How did symptoms start: surgery  Average pain intensity:  Last 24 hours: 5  Past week: 8  How often does the pt experience symptoms? Constantly  How much have the symptoms interfered with usual daily activities? Quite a bit  How has condition changed since care began at this facility? NA - initial visit  In general, how is the patients overall health? Very Good   BACK PAIN (STarT Back Screening Tool) No

## 2024-09-05 ENCOUNTER — Encounter: Payer: Self-pay | Admitting: Physical Therapy

## 2024-09-05 ENCOUNTER — Ambulatory Visit: Admitting: Physical Therapy

## 2024-09-05 DIAGNOSIS — M25562 Pain in left knee: Secondary | ICD-10-CM | POA: Diagnosis not present

## 2024-09-05 DIAGNOSIS — R2681 Unsteadiness on feet: Secondary | ICD-10-CM

## 2024-09-05 DIAGNOSIS — R600 Localized edema: Secondary | ICD-10-CM

## 2024-09-05 DIAGNOSIS — M25662 Stiffness of left knee, not elsewhere classified: Secondary | ICD-10-CM | POA: Diagnosis not present

## 2024-09-05 NOTE — Therapy (Signed)
 OUTPATIENT PHYSICAL THERAPY TREATMENT   Patient Name: Alyssa Valentine MRN: [id number] DOB:[birth year], 73 y.o., female Today's Date: 09/05/2024  END OF SESSION:  PT End of Session - 09/05/24 1322     Visit Number 3    Number of Visits 16    Date for Recertification  10/18/24    Authorization Type [employer] $20 copay    Progress Note Due on Visit 10    PT Start Time 1313    PT Stop Time 1352    PT Time Calculation (min) 39 min    Activity Tolerance Patient tolerated treatment well    Behavior During Therapy WFL for tasks assessed/performed            Past Medical History:  Diagnosis Date   Anemia    many years ago   Anxiety    Arthritis    Depression    GERD (gastroesophageal reflux disease)    Glaucoma    Headache    hx of migraines, no longer have them   HTN (hypertension)    Past Surgical History:  Procedure Laterality Date   BACK SURGERY  2017   CARPAL TUNNEL RELEASE Right 07/17/1989   CARPAL TUNNEL RELEASE Left 01/04/2016   Procedure: CARPAL TUNNEL RELEASE;  Surgeon: Elsie Mussel, MD;  Location: [facility];  Service: Orthopedics;  Laterality: Left;   CESAREAN SECTION  11/17/1983   COLONOSCOPY N/A 09/15/2013   Procedure: COLONOSCOPY;  Surgeon: Belvie JONETTA Just, MD;  Location: [facility];  Service: Endoscopy;  Laterality: N/A;   ESOPHAGOGASTRODUODENOSCOPY N/A 09/15/2013   Procedure: ESOPHAGOGASTRODUODENOSCOPY (EGD);  Surgeon: Belvie JONETTA Just, MD;  Location: [facility];  Service: Endoscopy;  Laterality: N/A;   ORIF RADIAL FRACTURE Left 01/04/2016   Procedure: OPEN REDUCTION INTERNAL FIXATION (ORIF) LEFT RADIAL FRACTURE;  Surgeon: Elsie Mussel, MD;  Location: [facility];  Service: Orthopedics;  Laterality: Left;   ROTATOR CUFF REPAIR Bilateral 2010 and 2013   TOTAL KNEE ARTHROPLASTY Left 08/07/2024   Procedure: ARTHROPLASTY, KNEE, TOTAL;  Surgeon: Jerri Kay HERO, MD;  Location: [facility];  Service: Orthopedics;  Laterality: Left;   Patient Active Problem List    Diagnosis Date Noted   Status post total left knee replacement 08/07/2024   Primary osteoarthritis of left knee 08/06/2024   Primary osteoarthritis of right hip 06/20/2024   Degenerative scoliosis in adult patient 05/28/2022   Spondylolisthesis of lumbar region 08/26/2016   Radius distal fracture 01/04/2016   Tinea unguium 02/23/2014   Insomnia 02/23/2014   Other and unspecified hyperlipidemia 02/23/2014   HTN (hypertension)     PCP: Verdia Lombard, MD  REFERRING PROVIDER: Jerri Kay HERO, MD  REFERRING DIAG: [phone] (ICD-10-CM) - Status post total left knee replacement M17.12 (ICD-10-CM) - Primary osteoarthritis of left knee  Rationale for Evaluation and Treatment: Rehabilitation  THERAPY DIAG:  Acute pain of left knee  Stiffness of left knee, not elsewhere classified  Unsteadiness on feet  Localized edema  ONSET DATE: 08/07/24   SUBJECTIVE:  SUBJECTIVE STATEMENT:  Pt arriving 13 minutes late to her appointment with pain of 5/10 in her left knee.   PERTINENT HISTORY:  HTN, insomnia, anxiety, depression, OA, glaucoma  Pt is s/p Lt TKA on 08/07/24. She had some HHPT and is here today amb with RW.  PAIN:  NPRS scale: 5/10 upon arrival Pain location: Lt knee Pain description: throbbing, aching, cramping Aggravating factors: bending the knee, occasionally walking Relieving factors: medication, ice  PRECAUTIONS:  None  RED FLAGS: None   WEIGHT BEARING RESTRICTIONS:  No  FALLS:  Has patient fallen in last 6 months? Yes. Number of falls 7; has neuropathy  LIVING ENVIRONMENT: Lives with: lives with their spouse and granddaughter (53 y/o) Lives in: House/apartment Stairs: Yes: External: 7 steps; can reach both Has following equipment at home: Single point cane, Walker - 2  wheeled, shower chair, and bed side commode  OCCUPATION:  Retired - Probation officer, CNA and med tech  PLOF:  Independent and Leisure: walking, reading  PATIENT GOALS:  Walk and function like before   OBJECTIVE:  Note: Objective measures were completed at Evaluation unless otherwise noted.   PATIENT SURVEYS:  Patient-Specific Activity Scoring Scheme  0 represents "unable to perform." 10 represents "able to perform at prior level. 0 1 2 3 4 5 6 7 8 9  10 (Date and Score)   Activity Eval     1. Stairs 5    2. Walking 5    3. Bowling 0   Score 3.33    Total score = sum of the activity scores/number of activities Minimum detectable change (90%CI) for average score = 2 points Minimum detectable change (90%CI) for single activity score = 3 points    COGNITIVE STATUS: 08/23/24 Within functional limits for tasks assessed   SENSATION: 08/23/24 WFL  POSTURE:  08/23/24 rounded shoulders and forward head   GAIT: 08/23/24 Distance walked: 100' Assistive device utilized: Environmental consultant - 2 wheeled Level of assistance: Modified independence Comments: decreased stance on Lt, narrow BOS, decreased heel strike on Lt   EDEMA: 08/23/24  Ace wrap applied but visible edema and bruising in LLE   LOWER EXTREMITY ROM:     ROM Right Eval 08/23/24 Left Eval 08/23/24 Left 09/05/24  Knee flexion  A: 94 P: 106 A: 95 P: 105  Knee extension A: -2 (seated LAQ) A: -6 (seated LAQ) P: -2 A: -5 P: -2   (Blank rows = not tested)   LOWER EXTREMITY MMT:    08/23/24: Not formally tested; grossly 3-/5  MMT Right eval Left eval  Hip flexion    Hip extension    Hip abduction    Hip adduction    Hip internal rotation    Hip external rotation    Knee flexion    Knee extension    Ankle dorsiflexion    Ankle plantarflexion    Ankle inversion    Ankle eversion     (Blank rows = not tested)     FUNCTIONAL TESTS:  08/23/24 5 times sit to stand: 16.5 sec with UE support;  unable to rise without UE support                   TREATMENT       DATE: 09/05/2024 Therex: UBE with bilat LE level 2 for 8 minutes , full fwd revolutions at seat 6 Seated LAQ with 1.5# ankle weight 2x10 with 2-3 sec hold  Seated SLR: 2 x 10  TherActivities: Sit to stand: x 10  c UE support Mini squats x 10 c UE support Standing hip abduction, x 10 bil c UE support Step ups on 4 inch step  x 10 c bil UE support Manual: Seated knee flexion with PT providing IR/distraction x 10,  Passive knee flexion in supine Vaso: Lt knee elevated on wedge with medium compression for 10 minutes at 34deg    TREATMENT       DATE: 08/30/2024 Therex: UBE with bilat LE level 2 for 8 minutes , full fwd revolutions at seat 9 Slant board gastroc stretch 3x30s  Seated LAQ with 1# ankle weight 2x10 with 2-3s hold  Standing TKE 2x10 with 2s hold  Supine heel slides x20 total   Manual: Seated knee flexion with PT providing IR/distraction and slight overpressure into knee flexion 1x10 with 10s hold and PT providing PROM knee extension in between   Vaso: Lt knee elevated on wedge with medium compression for 10 minutes at 34deg    TREATMENT       DATE: 08/23/24 TherEx See HEP - demonstrated with trial reps performed PRN, mod cues for comprehension    Self Care Educated on PT POC and clinical findings as well as goals of care     PATIENT EDUCATION:  Education details: HEP Person educated: Patient Education method: Programmer, multimedia, Facilities manager, and Handouts Education comprehension: verbalized understanding, returned demonstration, and needs further education  HOME EXERCISE PROGRAM: Access Code: [email or web address] URL: [email or web address] Date: 08/23/2024 Prepared by: Corean Ku  Exercises - Supine Heel Slide with Strap  - 5-10 x daily - 7 x weekly - 1 sets - 5-10 reps - Long Sitting Quad Set  - 5-10 x daily - 7 x weekly - 1 sets - 5-10 reps - 5 sec hold - Seated Knee Extension  AROM  - 5-10 x daily - 7 x weekly - 1 sets - 5-10 reps - 5 sec hold - Supine Knee Extension Mobilization with Weight  - 3-5 x daily - 7 x weekly - 1 sets - 1 reps - 3-5 min   ASSESSMENT:  CLINICAL IMPRESSION: Pt arriving today reporting 5/10 pain in her left knee. Pt reporting she feels more limited by her back at times. Pt with active left knee flexion today of 105 degrees. Pt tolerating all exercises well. Recommending continued skilled PT interventions.     OBJECTIVE IMPAIRMENTS: Abnormal gait, decreased balance, decreased mobility, difficulty walking, decreased ROM, decreased strength, hypomobility, increased edema, increased fascial restrictions, increased muscle spasms, and pain.   ACTIVITY LIMITATIONS: carrying, lifting, bending, standing, squatting, sleeping, stairs, transfers, and locomotion level  PARTICIPATION LIMITATIONS: meal prep, cleaning, laundry, driving, shopping, and community activity  PERSONAL FACTORS: Age, Past/current experiences, Time since onset of injury/illness/exacerbation, and 3+ comorbidities: HTN, insomnia, anxiety, depression, OA, glaucoma, neuropathy are also affecting patient's functional outcome.   REHAB POTENTIAL: Good  CLINICAL DECISION MAKING: Evolving/moderate complexity  EVALUATION COMPLEXITY: Moderate   GOALS: Goals reviewed with patient? Yes  SHORT TERM GOALS: Target date: 09/20/2024   Independent with initial HEP Goal status: MET 09/05/24  2.  Lt knee AROM improved to 0-2-105 for improved function and mobility Goal status: partially met with flexion 09/05/2024   LONG TERM GOALS: Target date: 10/18/2024   Independent with final HEP Goal status: INITIAL  2.  PSFS score improved by 3 points Goal status: INITIAL  3.  Lt knee AROM improved to 0-110 for improved mobility and function Goal status: INIITAL  4.  Report pain < 3/10 with standing and walking activities for improved  function Goal status: INITIAL  5.  Demonstrate  ability to rise from chair without UE support for improved functional strength Goal status: INITIAL  6.  Amb with LRAD mod I for improved community access and mobility Goal status: INITIAL  7. 5x STS improved to < 14 sec with UE support for improved balance     PLAN:  PT FREQUENCY: 2x/week  PT DURATION: 8 weeks  PLANNED INTERVENTIONS: 97164- PT Re-evaluation, 97750- Physical Performance Testing, 97110-Therapeutic exercises, 97530- Therapeutic activity, 97112- Neuromuscular re-education, 97535- Self Care, 02859- Manual therapy, [id number]- Gait training, [id number]- Aquatic Therapy, [phone]- Electrical stimulation (unattended), 97016- Vasopneumatic device, 20560 (1-2 muscles), 20561 (3+ muscles)- Dry Needling, Patient/Family education, Balance training, Stair training, Taping, Joint mobilization, Scar mobilization, DME instructions, Cryotherapy, and Moist heat.  PLAN FOR NEXT SESSION:  quad strengthening, maximize flexion, functional strength and balance   NEXT MD VISIT: 09/19/24  Delon Lunger, PT, MPT 09/05/24 1:43 PM   09/05/24 1:43 PM     Date of referral: 08/09/24 Referring provider: Jerri Kay HERO, MD Referring diagnosis? S03.347 (ICD-10-CM) - Status post total left knee replacement M17.12 (ICD-10-CM) - Primary osteoarthritis of left knee Treatment diagnosis? (if different than referring diagnosis) M25.562, M25.662, R26.81, R60.0  What was this (referring dx) caused by? Surgery (Type: Left TKA) and Arthritis  Nature of Condition: Initial Onset (within last 3 months)   Laterality: Lt  Current Functional Measure Score: Patient Specific Functional Scale 3.33  Objective measurements identify impairments when they are compared to normal values, the uninvolved extremity, and prior level of function.  [x]  Yes  []  No  Objective assessment of functional ability: Moderate functional limitations   Briefly describe symptoms: Left TKA - pain, swelling, decreased balance, ROM,  strength  How did symptoms start: surgery  Average pain intensity:  Last 24 hours: 5  Past week: 8  How often does the pt experience symptoms? Constantly  How much have the symptoms interfered with usual daily activities? Quite a bit  How has condition changed since care began at this facility? NA - initial visit  In general, how is the patients overall health? Very Good   BACK PAIN (STarT Back Screening Tool) No

## 2024-09-08 ENCOUNTER — Encounter: Admitting: Physical Therapy

## 2024-09-08 ENCOUNTER — Encounter: Admitting: Rehabilitative and Restorative Service Providers"

## 2024-09-08 NOTE — Therapy (Incomplete)
 OUTPATIENT PHYSICAL THERAPY TREATMENT   Patient Name: Alyssa Valentine MRN: [id number] DOB:1951/10/06, 73 y.o., female Today's Date: 09/08/2024  END OF SESSION:      Past Medical History:  Diagnosis Date   Anemia    many years ago   Anxiety    Arthritis    Depression    GERD (gastroesophageal reflux disease)    Glaucoma    Headache    hx of migraines, no longer have them   HTN (hypertension)    Past Surgical History:  Procedure Laterality Date   BACK SURGERY  2017   CARPAL TUNNEL RELEASE Right 07/17/1989   CARPAL TUNNEL RELEASE Left 01/04/2016   Procedure: CARPAL TUNNEL RELEASE;  Surgeon: Elsie Mussel, MD;  Location: [facility];  Service: Orthopedics;  Laterality: Left;   CESAREAN SECTION  11/17/1983   COLONOSCOPY N/A 09/15/2013   Procedure: COLONOSCOPY;  Surgeon: Belvie JONETTA Just, MD;  Location: [facility];  Service: Endoscopy;  Laterality: N/A;   ESOPHAGOGASTRODUODENOSCOPY N/A 09/15/2013   Procedure: ESOPHAGOGASTRODUODENOSCOPY (EGD);  Surgeon: Belvie JONETTA Just, MD;  Location: [facility];  Service: Endoscopy;  Laterality: N/A;   ORIF RADIAL FRACTURE Left 01/04/2016   Procedure: OPEN REDUCTION INTERNAL FIXATION (ORIF) LEFT RADIAL FRACTURE;  Surgeon: Elsie Mussel, MD;  Location: [facility];  Service: Orthopedics;  Laterality: Left;   ROTATOR CUFF REPAIR Bilateral 2010 and 2013   TOTAL KNEE ARTHROPLASTY Left 08/07/2024   Procedure: ARTHROPLASTY, KNEE, TOTAL;  Surgeon: Jerri Kay HERO, MD;  Location: [facility];  Service: Orthopedics;  Laterality: Left;   Patient Active Problem List   Diagnosis Date Noted   Status post total left knee replacement 08/07/2024   Primary osteoarthritis of left knee 08/06/2024   Primary osteoarthritis of right hip 06/20/2024   Degenerative scoliosis in adult patient 05/28/2022   Spondylolisthesis of lumbar region 08/26/2016   Radius distal fracture 01/04/2016   Tinea unguium 02/23/2014   Insomnia 02/23/2014   Other and unspecified hyperlipidemia  02/23/2014   HTN (hypertension)     PCP: Verdia Lombard, MD  REFERRING PROVIDER: Jerri Kay HERO, MD  REFERRING DIAG: [phone] (ICD-10-CM) - Status post total left knee replacement M17.12 (ICD-10-CM) - Primary osteoarthritis of left knee  Rationale for Evaluation and Treatment: Rehabilitation  THERAPY DIAG:  No diagnosis found.  ONSET DATE: 08/07/24   SUBJECTIVE:                                                                                                                                                                                           SUBJECTIVE STATEMENT:  Pt arriving 13 minutes late to her appointment with pain of 5/10 in her  left knee.   PERTINENT HISTORY:  HTN, insomnia, anxiety, depression, OA, glaucoma  Pt is s/p Lt TKA on 08/07/24. She had some HHPT and is here today amb with RW.  PAIN:  NPRS scale: 5/10 upon arrival Pain location: Lt knee Pain description: throbbing, aching, cramping Aggravating factors: bending the knee, occasionally walking Relieving factors: medication, ice  PRECAUTIONS:  None  RED FLAGS: None   WEIGHT BEARING RESTRICTIONS:  No  FALLS:  Has patient fallen in last 6 months? Yes. Number of falls 7; has neuropathy  LIVING ENVIRONMENT: Lives with: lives with their spouse and granddaughter (22 y/o) Lives in: House/apartment Stairs: Yes: External: 7 steps; can reach both Has following equipment at home: Single point cane, Walker - 2 wheeled, shower chair, and bed side commode  OCCUPATION:  Retired - Probation officer, CNA and med tech  PLOF:  Independent and Leisure: walking, reading  PATIENT GOALS:  Walk and function like before   OBJECTIVE:  Note: Objective measures were completed at Evaluation unless otherwise noted.   PATIENT SURVEYS:  Patient-Specific Activity Scoring Scheme  0 represents "unable to perform." 10 represents "able to perform at prior level. 0 1 2 3 4 5 6 7 8 9  10 (Date and  Score)   Activity Eval     1. Stairs 5    2. Walking 5    3. Bowling 0   Score 3.33    Total score = sum of the activity scores/number of activities Minimum detectable change (90%CI) for average score = 2 points Minimum detectable change (90%CI) for single activity score = 3 points    COGNITIVE STATUS: 08/23/24 Within functional limits for tasks assessed   SENSATION: 08/23/24 WFL  POSTURE:  08/23/24 rounded shoulders and forward head   GAIT: 08/23/24 Distance walked: 100' Assistive device utilized: Environmental consultant - 2 wheeled Level of assistance: Modified independence Comments: decreased stance on Lt, narrow BOS, decreased heel strike on Lt   EDEMA: 08/23/24  Ace wrap applied but visible edema and bruising in LLE   LOWER EXTREMITY ROM:     ROM Right Eval 08/23/24 Left Eval 08/23/24 Left 09/05/24  Knee flexion  A: 94 P: 106 A: 95 P: 105  Knee extension A: -2 (seated LAQ) A: -6 (seated LAQ) P: -2 A: -5 P: -2   (Blank rows = not tested)   LOWER EXTREMITY MMT:    08/23/24: Not formally tested; grossly 3-/5  MMT Right eval Left eval  Hip flexion    Hip extension    Hip abduction    Hip adduction    Hip internal rotation    Hip external rotation    Knee flexion    Knee extension    Ankle dorsiflexion    Ankle plantarflexion    Ankle inversion    Ankle eversion     (Blank rows = not tested)     FUNCTIONAL TESTS:  08/23/24 5 times sit to stand: 16.5 sec with UE support; unable to rise without UE support                   TREATMENT       DATE: 09/08/2024 Therex:    TREATMENT       DATE: 09/05/2024 Therex: UBE with bilat LE level 2 for 8 minutes , full fwd revolutions at seat 6 Seated LAQ with 1.5# ankle weight 2x10 with 2-3 sec hold  Seated SLR: 2 x 10  TherActivities: Sit to stand: x 10 c UE support Mini squats  x 10 c UE support Standing hip abduction, x 10 bil c UE support Step ups on 4 inch step  x 10 c bil UE support Manual: Seated knee  flexion with PT providing IR/distraction x 10,  Passive knee flexion in supine Vaso: Lt knee elevated on wedge with medium compression for 10 minutes at 34deg    TREATMENT       DATE: 08/30/2024 Therex: UBE with bilat LE level 2 for 8 minutes , full fwd revolutions at seat 9 Slant board gastroc stretch 3x30s  Seated LAQ with 1# ankle weight 2x10 with 2-3s hold  Standing TKE 2x10 with 2s hold  Supine heel slides x20 total   Manual: Seated knee flexion with PT providing IR/distraction and slight overpressure into knee flexion 1x10 with 10s hold and PT providing PROM knee extension in between   Vaso: Lt knee elevated on wedge with medium compression for 10 minutes at 34deg    TREATMENT       DATE: 08/23/24 TherEx See HEP - demonstrated with trial reps performed PRN, mod cues for comprehension    Self Care Educated on PT POC and clinical findings as well as goals of care     PATIENT EDUCATION:  Education details: HEP Person educated: Patient Education method: Programmer, multimedia, Facilities manager, and Handouts Education comprehension: verbalized understanding, returned demonstration, and needs further education  HOME EXERCISE PROGRAM: Access Code: [email or web address] URL: [email or web address] Date: 08/23/2024 Prepared by: Corean Ku  Exercises - Supine Heel Slide with Strap  - 5-10 x daily - 7 x weekly - 1 sets - 5-10 reps - Long Sitting Quad Set  - 5-10 x daily - 7 x weekly - 1 sets - 5-10 reps - 5 sec hold - Seated Knee Extension AROM  - 5-10 x daily - 7 x weekly - 1 sets - 5-10 reps - 5 sec hold - Supine Knee Extension Mobilization with Weight  - 3-5 x daily - 7 x weekly - 1 sets - 1 reps - 3-5 min   ASSESSMENT:  CLINICAL IMPRESSION: Pt arriving today reporting 5/10 pain in her left knee. Pt reporting she feels more limited by her back at times. Pt with active left knee flexion today of 105 degrees. Pt tolerating all exercises well. Recommending continued skilled  PT interventions.     OBJECTIVE IMPAIRMENTS: Abnormal gait, decreased balance, decreased mobility, difficulty walking, decreased ROM, decreased strength, hypomobility, increased edema, increased fascial restrictions, increased muscle spasms, and pain.   ACTIVITY LIMITATIONS: carrying, lifting, bending, standing, squatting, sleeping, stairs, transfers, and locomotion level  PARTICIPATION LIMITATIONS: meal prep, cleaning, laundry, driving, shopping, and community activity  PERSONAL FACTORS: Age, Past/current experiences, Time since onset of injury/illness/exacerbation, and 3+ comorbidities: HTN, insomnia, anxiety, depression, OA, glaucoma, neuropathy are also affecting patient's functional outcome.   REHAB POTENTIAL: Good  CLINICAL DECISION MAKING: Evolving/moderate complexity  EVALUATION COMPLEXITY: Moderate   GOALS: Goals reviewed with patient? Yes  SHORT TERM GOALS: Target date: 09/20/2024   Independent with initial HEP Goal status: MET 09/05/24  2.  Lt knee AROM improved to 0-2-105 for improved function and mobility Goal status: partially met with flexion 09/05/2024   LONG TERM GOALS: Target date: 10/18/2024   Independent with final HEP Goal status: INITIAL  2.  PSFS score improved by 3 points Goal status: INITIAL  3.  Lt knee AROM improved to 0-110 for improved mobility and function Goal status: INIITAL  4.  Report pain < 3/10 with standing and walking activities for improved function Goal status: INITIAL  5.  Demonstrate ability to rise from chair without UE support for improved functional strength Goal status: INITIAL  6.  Amb with LRAD mod I for improved community access and mobility Goal status: INITIAL  7. 5x STS improved to < 14 sec with UE support for improved balance     PLAN:  PT FREQUENCY: 2x/week  PT DURATION: 8 weeks  PLANNED INTERVENTIONS: 97164- PT Re-evaluation, 97750- Physical Performance Testing, 97110-Therapeutic exercises, 97530-  Therapeutic activity, 97112- Neuromuscular re-education, 97535- Self Care, 02859- Manual therapy, [id number]- Gait training, [phone]- Aquatic Therapy, [phone]- Electrical stimulation (unattended), 97016- Vasopneumatic device, 20560 (1-2 muscles), 20561 (3+ muscles)- Dry Needling, Patient/Family education, Balance training, Stair training, Taping, Joint mobilization, Scar mobilization, DME instructions, Cryotherapy, and Moist heat.  PLAN FOR NEXT SESSION:  quad strengthening, maximize flexion, functional strength and balance   NEXT MD VISIT: 09/19/24  Ozell Silvan, PT, DPT, OCS, ATC 09/08/24  8:16 AM      Date of referral: 08/09/24 Referring provider: Jerri Kay HERO, MD Referring diagnosis? S03.347 (ICD-10-CM) - Status post total left knee replacement M17.12 (ICD-10-CM) - Primary osteoarthritis of left knee Treatment diagnosis? (if different than referring diagnosis) M25.562, M25.662, R26.81, R60.0  What was this (referring dx) caused by? Surgery (Type: Left TKA) and Arthritis  Nature of Condition: Initial Onset (within last 3 months)   Laterality: Lt  Current Functional Measure Score: Patient Specific Functional Scale 3.33  Objective measurements identify impairments when they are compared to normal values, the uninvolved extremity, and prior level of function.  [x]  Yes  []  No  Objective assessment of functional ability: Moderate functional limitations   Briefly describe symptoms: Left TKA - pain, swelling, decreased balance, ROM, strength  How did symptoms start: surgery  Average pain intensity:  Last 24 hours: 5  Past week: 8  How often does the pt experience symptoms? Constantly  How much have the symptoms interfered with usual daily activities? Quite a bit  How has condition changed since care began at this facility? NA - initial visit  In general, how is the patients overall health? Very Good   BACK PAIN (STarT Back Screening Tool) No

## 2024-09-12 ENCOUNTER — Other Ambulatory Visit: Payer: Self-pay | Admitting: Physician Assistant

## 2024-09-12 ENCOUNTER — Telehealth: Payer: Self-pay | Admitting: Orthopaedic Surgery

## 2024-09-12 ENCOUNTER — Encounter: Admitting: Physical Therapy

## 2024-09-12 MED ORDER — HYDROCODONE-ACETAMINOPHEN 5-325 MG PO TABS: 1.0000 | ORAL_TABLET | Freq: Two times a day (BID) | ORAL | 0 refills | Status: DC | PRN

## 2024-09-12 NOTE — Telephone Encounter (Signed)
 Advised patient

## 2024-09-12 NOTE — Telephone Encounter (Signed)
 Patient called and needs a refill on pain medication. CB#[phone]

## 2024-09-12 NOTE — Therapy (Incomplete)
 OUTPATIENT PHYSICAL THERAPY TREATMENT   Patient Name: Alyssa Valentine MRN: [id number] DOB:03-26-1951, 73 y.o., female Today's Date: 09/12/2024  END OF SESSION:      Past Medical History:  Diagnosis Date   Anemia    many years ago   Anxiety    Arthritis    Depression    GERD (gastroesophageal reflux disease)    Glaucoma    Headache    hx of migraines, no longer have them   HTN (hypertension)    Past Surgical History:  Procedure Laterality Date   BACK SURGERY  2017   CARPAL TUNNEL RELEASE Right 07/17/1989   CARPAL TUNNEL RELEASE Left 01/04/2016   Procedure: CARPAL TUNNEL RELEASE;  Surgeon: Elsie Mussel, MD;  Location: [facility];  Service: Orthopedics;  Laterality: Left;   CESAREAN SECTION  11/17/1983   COLONOSCOPY N/A 09/15/2013   Procedure: COLONOSCOPY;  Surgeon: Belvie JONETTA Just, MD;  Location: [facility];  Service: Endoscopy;  Laterality: N/A;   ESOPHAGOGASTRODUODENOSCOPY N/A 09/15/2013   Procedure: ESOPHAGOGASTRODUODENOSCOPY (EGD);  Surgeon: Belvie JONETTA Just, MD;  Location: [facility];  Service: Endoscopy;  Laterality: N/A;   ORIF RADIAL FRACTURE Left 01/04/2016   Procedure: OPEN REDUCTION INTERNAL FIXATION (ORIF) LEFT RADIAL FRACTURE;  Surgeon: Elsie Mussel, MD;  Location: [facility];  Service: Orthopedics;  Laterality: Left;   ROTATOR CUFF REPAIR Bilateral 2010 and 2013   TOTAL KNEE ARTHROPLASTY Left 08/07/2024   Procedure: ARTHROPLASTY, KNEE, TOTAL;  Surgeon: Jerri Kay HERO, MD;  Location: [facility];  Service: Orthopedics;  Laterality: Left;   Patient Active Problem List   Diagnosis Date Noted   Status post total left knee replacement 08/07/2024   Primary osteoarthritis of left knee 08/06/2024   Primary osteoarthritis of right hip 06/20/2024   Degenerative scoliosis in adult patient 05/28/2022   Spondylolisthesis of lumbar region 08/26/2016   Radius distal fracture 01/04/2016   Tinea unguium 02/23/2014   Insomnia 02/23/2014   Other and unspecified hyperlipidemia  02/23/2014   HTN (hypertension)     PCP: Verdia Lombard, MD  REFERRING PROVIDER: Jerri Kay HERO, MD  REFERRING DIAG: [phone] (ICD-10-CM) - Status post total left knee replacement M17.12 (ICD-10-CM) - Primary osteoarthritis of left knee  Rationale for Evaluation and Treatment: Rehabilitation  THERAPY DIAG:  No diagnosis found.  ONSET DATE: 08/07/24   SUBJECTIVE:                                                                                                                                                                                           SUBJECTIVE STATEMENT: *** Pt arriving 13 minutes late to her appointment with pain of 5/10 in her  left knee.   PERTINENT HISTORY:  HTN, insomnia, anxiety, depression, OA, glaucoma  Pt is s/p Lt TKA on 08/07/24. She had some HHPT and is here today amb with RW.  PAIN:  NPRS scale: 5/10 upon arrival Pain location: Lt knee Pain description: throbbing, aching, cramping Aggravating factors: bending the knee, occasionally walking Relieving factors: medication, ice  PRECAUTIONS:  None  RED FLAGS: None   WEIGHT BEARING RESTRICTIONS:  No  FALLS:  Has patient fallen in last 6 months? Yes. Number of falls 7; has neuropathy  LIVING ENVIRONMENT: Lives with: lives with their spouse and granddaughter (44 y/o) Lives in: House/apartment Stairs: Yes: External: 7 steps; can reach both Has following equipment at home: Single point cane, Walker - 2 wheeled, shower chair, and bed side commode  OCCUPATION:  Retired - probation officer, CNA and med tech  PLOF:  Independent and Leisure: walking, reading  PATIENT GOALS:  Walk and function like before   OBJECTIVE:  Note: Objective measures were completed at Evaluation unless otherwise noted.   PATIENT SURVEYS:  Patient-Specific Activity Scoring Scheme  0 represents "unable to perform." 10 represents "able to perform at prior level. 0 1 2 3 4 5 6 7 8 9  10 (Date and  Score)   Activity Eval     1. Stairs 5    2. Walking 5    3. Bowling 0   Score 3.33    Total score = sum of the activity scores/number of activities Minimum detectable change (90%CI) for average score = 2 points Minimum detectable change (90%CI) for single activity score = 3 points    COGNITIVE STATUS: 08/23/24 Within functional limits for tasks assessed   SENSATION: 08/23/24 WFL  POSTURE:  08/23/24 rounded shoulders and forward head   GAIT: 08/23/24 Distance walked: 100' Assistive device utilized: Environmental Consultant - 2 wheeled Level of assistance: Modified independence Comments: decreased stance on Lt, narrow BOS, decreased heel strike on Lt   EDEMA: 08/23/24  Ace wrap applied but visible edema and bruising in LLE   LOWER EXTREMITY ROM:     ROM Right Eval 08/23/24 Left Eval 08/23/24 Left 09/05/24  Knee flexion  A: 94 P: 106 A: 95 P: 105  Knee extension A: -2 (seated LAQ) A: -6 (seated LAQ) P: -2 A: -5 P: -2   (Blank rows = not tested)   LOWER EXTREMITY MMT:    08/23/24: Not formally tested; grossly 3-/5  MMT Right eval Left eval  Hip flexion    Hip extension    Hip abduction    Hip adduction    Hip internal rotation    Hip external rotation    Knee flexion    Knee extension    Ankle dorsiflexion    Ankle plantarflexion    Ankle inversion    Ankle eversion     (Blank rows = not tested)     FUNCTIONAL TESTS:  08/23/24 5 times sit to stand: 16.5 sec with UE support; unable to rise without UE support                   TREATMENT 09/12/2024     09/05/2024 Therex: UBE with bilat LE level 2 for 8 minutes , full fwd revolutions at seat 6 Seated LAQ with 1.5# ankle weight 2x10 with 2-3 sec hold  Seated SLR: 2 x 10  TherActivities: Sit to stand: x 10 c UE support Mini squats x 10 c UE support Standing hip abduction, x 10 bil c UE support Step  ups on 4 inch step  x 10 c bil UE support Manual: Seated knee flexion with PT providing  IR/distraction x 10,  Passive knee flexion in supine Vaso: Lt knee elevated on wedge with medium compression for 10 minutes at 34deg    08/30/2024 Therex: UBE with bilat LE level 2 for 8 minutes , full fwd revolutions at seat 9 Slant board gastroc stretch 3x30s  Seated LAQ with 1# ankle weight 2x10 with 2-3s hold  Standing TKE 2x10 with 2s hold  Supine heel slides x20 total   Manual: Seated knee flexion with PT providing IR/distraction and slight overpressure into knee flexion 1x10 with 10s hold and PT providing PROM knee extension in between   Vaso: Lt knee elevated on wedge with medium compression for 10 minutes at 34deg   08/23/24 TherEx See HEP - demonstrated with trial reps performed PRN, mod cues for comprehension    Self Care Educated on PT POC and clinical findings as well as goals of care     PATIENT EDUCATION:  Education details: HEP Person educated: Patient Education method: Programmer, Multimedia, Facilities Manager, and Handouts Education comprehension: verbalized understanding, returned demonstration, and needs further education  HOME EXERCISE PROGRAM: Access Code: [email or web address] URL: [email or web address] Date: 08/23/2024 Prepared by: Corean Ku  Exercises - Supine Heel Slide with Strap  - 5-10 x daily - 7 x weekly - 1 sets - 5-10 reps - Long Sitting Quad Set  - 5-10 x daily - 7 x weekly - 1 sets - 5-10 reps - 5 sec hold - Seated Knee Extension AROM  - 5-10 x daily - 7 x weekly - 1 sets - 5-10 reps - 5 sec hold - Supine Knee Extension Mobilization with Weight  - 3-5 x daily - 7 x weekly - 1 sets - 1 reps - 3-5 min   ASSESSMENT:  CLINICAL IMPRESSION: *** Pt arriving today reporting 5/10 pain in her left knee. Pt reporting she feels more limited by her back at times. Pt with active left knee flexion today of 105 degrees. Pt tolerating all exercises well. Recommending continued skilled PT interventions.     OBJECTIVE IMPAIRMENTS: Abnormal gait,  decreased balance, decreased mobility, difficulty walking, decreased ROM, decreased strength, hypomobility, increased edema, increased fascial restrictions, increased muscle spasms, and pain.   ACTIVITY LIMITATIONS: carrying, lifting, bending, standing, squatting, sleeping, stairs, transfers, and locomotion level  PARTICIPATION LIMITATIONS: meal prep, cleaning, laundry, driving, shopping, and community activity  PERSONAL FACTORS: Age, Past/current experiences, Time since onset of injury/illness/exacerbation, and 3+ comorbidities: HTN, insomnia, anxiety, depression, OA, glaucoma, neuropathy are also affecting patient's functional outcome.   REHAB POTENTIAL: Good  CLINICAL DECISION MAKING: Evolving/moderate complexity  EVALUATION COMPLEXITY: Moderate   GOALS: Goals reviewed with patient? Yes  SHORT TERM GOALS: Target date: 09/20/2024   Independent with initial HEP Goal status: MET 09/05/24  2.  Lt knee AROM improved to 0-2-105 for improved function and mobility Goal status: partially met with flexion 09/05/2024   LONG TERM GOALS: Target date: 10/18/2024   Independent with final HEP Goal status: INITIAL  2.  PSFS score improved by 3 points Goal status: INITIAL  3.  Lt knee AROM improved to 0-110 for improved mobility and function Goal status: INIITAL  4.  Report pain < 3/10 with standing and walking activities for improved function Goal status: INITIAL  5.  Demonstrate ability to rise from chair without UE support for improved functional strength Goal status: INITIAL  6.  Amb with LRAD mod I for improved community access  and mobility Goal status: INITIAL  7. 5x STS improved to < 14 sec with UE support for improved balance Goal status: INITIAL    PLAN:  PT FREQUENCY: 2x/week  PT DURATION: 8 weeks  PLANNED INTERVENTIONS: 97164- PT Re-evaluation, 97750- Physical Performance Testing, 97110-Therapeutic exercises, 97530- Therapeutic activity, 97112- Neuromuscular  re-education, 97535- Self Care, 02859- Manual therapy, [id number]- Gait training, [phone]- Aquatic Therapy, [phone]- Electrical stimulation (unattended), 97016- Vasopneumatic device, 20560 (1-2 muscles), 20561 (3+ muscles)- Dry Needling, Patient/Family education, Balance training, Stair training, Taping, Joint mobilization, Scar mobilization, DME instructions, Cryotherapy, and Moist heat.  PLAN FOR NEXT SESSION: *** quad strengthening, maximize flexion, functional strength and balance   NEXT MD VISIT: 09/19/24  Corean JULIANNA Ku, PT, DPT 09/12/24 7:10 AM       Date of referral: 08/09/24 Referring provider: Jerri Kay HERO, MD Referring diagnosis? S03.347 (ICD-10-CM) - Status post total left knee replacement M17.12 (ICD-10-CM) - Primary osteoarthritis of left knee Treatment diagnosis? (if different than referring diagnosis) M25.562, M25.662, R26.81, R60.0  What was this (referring dx) caused by? Surgery (Type: Left TKA) and Arthritis  Nature of Condition: Initial Onset (within last 3 months)   Laterality: Lt  Current Functional Measure Score: Patient Specific Functional Scale 3.33  Objective measurements identify impairments when they are compared to normal values, the uninvolved extremity, and prior level of function.  [x]  Yes  []  No  Objective assessment of functional ability: Moderate functional limitations   Briefly describe symptoms: Left TKA - pain, swelling, decreased balance, ROM, strength  How did symptoms start: surgery  Average pain intensity:  Last 24 hours: 5  Past week: 8  How often does the pt experience symptoms? Constantly  How much have the symptoms interfered with usual daily activities? Quite a bit  How has condition changed since care began at this facility? NA - initial visit  In general, how is the patients overall health? Very Good   BACK PAIN (STarT Back Screening Tool) No

## 2024-09-12 NOTE — Telephone Encounter (Signed)
 Sent in norco

## 2024-09-14 ENCOUNTER — Encounter

## 2024-09-14 ENCOUNTER — Telehealth: Payer: Self-pay

## 2024-09-14 NOTE — Telephone Encounter (Signed)
 Patient had a no-show for appointment today, 09/14/2024 at 10:15. PT called patient and left a voicemail to remind patient of upcoming appointment.   Susannah Daring, PT, DPT 09/14/24 10:40 AM

## 2024-09-19 ENCOUNTER — Ambulatory Visit

## 2024-09-19 ENCOUNTER — Encounter: Payer: Self-pay | Admitting: Physical Therapy

## 2024-09-19 ENCOUNTER — Ambulatory Visit (INDEPENDENT_AMBULATORY_CARE_PROVIDER_SITE_OTHER): Admitting: Physical Therapy

## 2024-09-19 ENCOUNTER — Ambulatory Visit (INDEPENDENT_AMBULATORY_CARE_PROVIDER_SITE_OTHER): Admitting: Physician Assistant

## 2024-09-19 DIAGNOSIS — M25562 Pain in left knee: Secondary | ICD-10-CM | POA: Diagnosis not present

## 2024-09-19 DIAGNOSIS — Z96652 Presence of left artificial knee joint: Secondary | ICD-10-CM

## 2024-09-19 DIAGNOSIS — R600 Localized edema: Secondary | ICD-10-CM | POA: Diagnosis not present

## 2024-09-19 DIAGNOSIS — M25662 Stiffness of left knee, not elsewhere classified: Secondary | ICD-10-CM

## 2024-09-19 DIAGNOSIS — R2681 Unsteadiness on feet: Secondary | ICD-10-CM

## 2024-09-19 MED ORDER — HYDROCODONE-ACETAMINOPHEN 5-325 MG PO TABS: 1.0000 | ORAL_TABLET | Freq: Every day | ORAL | 0 refills | Status: AC | PRN

## 2024-09-19 NOTE — Progress Notes (Signed)
 Post-Op Visit Note   Patient: Alyssa Valentine           Date of Birth: 1950-12-23           MRN: [id number] Visit Date: 09/19/2024 PCP: Verdia Lombard, MD   Assessment & Plan:  Chief Complaint:  Chief Complaint  Patient presents with   Left Knee - Follow-up    Left TKA 08/07/2024   Visit Diagnoses:  1. Status post total left knee replacement     Plan: Patient is a pleasant 73 year old female who comes in today 6 weeks status post left total knee replacement.  She has been doing well.  She has been taking Norco for pain.  She has been in physical therapy making good progress.  Currently ambulating with a walker which she states primarily due to hip pain.  Examination of her left knee reveals range of motion of 0 to 115 degrees.  She is stable valgus varus stress.  She is neurovascularly intact distally.  At this point, she will continue with physical therapy.  I refilled her Norco.  She will follow-up in 6 weeks for repeat evaluation.  Call with concerns or questions.  Follow-Up Instructions: Return in about 4 weeks (around 10/17/2024) for with xu.   Orders:  Orders Placed This Encounter  Procedures   XR Knee 1-2 Views Left   Meds ordered this encounter  Medications   HYDROcodone -acetaminophen  (NORCO/VICODIN) 5-325 MG tablet    Sig: Take 1-2 tablets by mouth daily as needed.    Dispense:  30 tablet    Refill:  0    Imaging: XR Knee 1-2 Views Left Result Date: 09/19/2024 Well seated prosthesis without complication   PMFS History: Patient Active Problem List   Diagnosis Date Noted   Status post total left knee replacement 08/07/2024   Primary osteoarthritis of left knee 08/06/2024   Primary osteoarthritis of right hip 06/20/2024   Degenerative scoliosis in adult patient 05/28/2022   Spondylolisthesis of lumbar region 08/26/2016   Radius distal fracture 01/04/2016   Tinea unguium 02/23/2014   Insomnia 02/23/2014   Other and unspecified hyperlipidemia 02/23/2014    HTN (hypertension)    Past Medical History:  Diagnosis Date   Anemia    many years ago   Anxiety    Arthritis    Depression    GERD (gastroesophageal reflux disease)    Glaucoma    Headache    hx of migraines, no longer have them   HTN (hypertension)     No family history on file.  Past Surgical History:  Procedure Laterality Date   BACK SURGERY  2017   CARPAL TUNNEL RELEASE Right 07/17/1989   CARPAL TUNNEL RELEASE Left 01/04/2016   Procedure: CARPAL TUNNEL RELEASE;  Surgeon: Elsie Mussel, MD;  Location: [facility];  Service: Orthopedics;  Laterality: Left;   CESAREAN SECTION  11/17/1983   COLONOSCOPY N/A 09/15/2013   Procedure: COLONOSCOPY;  Surgeon: Belvie JONETTA Just, MD;  Location: [facility];  Service: Endoscopy;  Laterality: N/A;   ESOPHAGOGASTRODUODENOSCOPY N/A 09/15/2013   Procedure: ESOPHAGOGASTRODUODENOSCOPY (EGD);  Surgeon: Belvie JONETTA Just, MD;  Location: [facility];  Service: Endoscopy;  Laterality: N/A;   ORIF RADIAL FRACTURE Left 01/04/2016   Procedure: OPEN REDUCTION INTERNAL FIXATION (ORIF) LEFT RADIAL FRACTURE;  Surgeon: Elsie Mussel, MD;  Location: [facility];  Service: Orthopedics;  Laterality: Left;   ROTATOR CUFF REPAIR Bilateral 2010 and 2013   TOTAL KNEE ARTHROPLASTY Left 08/07/2024   Procedure: ARTHROPLASTY, KNEE, TOTAL;  Surgeon: Jerri,  Kay HERO, MD;  Location: [facility];  Service: Orthopedics;  Laterality: Left;   Social History   Occupational History   Not on file  Tobacco Use   Smoking status: Never   Smokeless tobacco: Never  Vaping Use   Vaping status: Never Used  Substance and Sexual Activity   Alcohol use: Yes    Comment: occasional   Drug use: No   Sexual activity: Not on file

## 2024-09-19 NOTE — Therapy (Signed)
 OUTPATIENT PHYSICAL THERAPY TREATMENT   Patient Name: Alyssa Valentine MRN: [id number] DOB:1951-09-21, 73 y.o., female Today's Date: 09/19/2024  END OF SESSION:  PT End of Session - 09/19/24 1516     Visit Number 4    Number of Visits 16    Authorization Type [employer] $20 copay    Progress Note Due on Visit 10    PT Start Time 1513    PT Stop Time 1601    PT Time Calculation (min) 48 min    Activity Tolerance Patient tolerated treatment well    Behavior During Therapy WFL for tasks assessed/performed             Past Medical History:  Diagnosis Date   Anemia    many years ago   Anxiety    Arthritis    Depression    GERD (gastroesophageal reflux disease)    Glaucoma    Headache    hx of migraines, no longer have them   HTN (hypertension)    Past Surgical History:  Procedure Laterality Date   BACK SURGERY  2017   CARPAL TUNNEL RELEASE Right 07/17/1989   CARPAL TUNNEL RELEASE Left 01/04/2016   Procedure: CARPAL TUNNEL RELEASE;  Surgeon: Elsie Mussel, MD;  Location: [facility];  Service: Orthopedics;  Laterality: Left;   CESAREAN SECTION  11/17/1983   COLONOSCOPY N/A 09/15/2013   Procedure: COLONOSCOPY;  Surgeon: Belvie JONETTA Just, MD;  Location: [facility];  Service: Endoscopy;  Laterality: N/A;   ESOPHAGOGASTRODUODENOSCOPY N/A 09/15/2013   Procedure: ESOPHAGOGASTRODUODENOSCOPY (EGD);  Surgeon: Belvie JONETTA Just, MD;  Location: [facility];  Service: Endoscopy;  Laterality: N/A;   ORIF RADIAL FRACTURE Left 01/04/2016   Procedure: OPEN REDUCTION INTERNAL FIXATION (ORIF) LEFT RADIAL FRACTURE;  Surgeon: Elsie Mussel, MD;  Location: [facility];  Service: Orthopedics;  Laterality: Left;   ROTATOR CUFF REPAIR Bilateral 2010 and 2013   TOTAL KNEE ARTHROPLASTY Left 08/07/2024   Procedure: ARTHROPLASTY, KNEE, TOTAL;  Surgeon: Jerri Kay HERO, MD;  Location: [facility];  Service: Orthopedics;  Laterality: Left;   Patient Active Problem List   Diagnosis Date Noted   Status post total  left knee replacement 08/07/2024   Primary osteoarthritis of left knee 08/06/2024   Primary osteoarthritis of right hip 06/20/2024   Degenerative scoliosis in adult patient 05/28/2022   Spondylolisthesis of lumbar region 08/26/2016   Radius distal fracture 01/04/2016   Tinea unguium 02/23/2014   Insomnia 02/23/2014   Other and unspecified hyperlipidemia 02/23/2014   HTN (hypertension)     PCP: Verdia Lombard, MD  REFERRING PROVIDER: Jerri Kay HERO, MD  REFERRING DIAG: [phone] (ICD-10-CM) - Status post total left knee replacement M17.12 (ICD-10-CM) - Primary osteoarthritis of left knee  Rationale for Evaluation and Treatment: Rehabilitation  THERAPY DIAG:  Acute pain of left knee  Stiffness of left knee, not elsewhere classified  Unsteadiness on feet  Localized edema  ONSET DATE: 08/07/24   SUBJECTIVE:  SUBJECTIVE STATEMENT: Pt arriving today reporting 2/10 pain in her left knee. Pt amb with rollator walker.   PERTINENT HISTORY:  HTN, insomnia, anxiety, depression, OA, glaucoma  Pt is s/p Lt TKA on 08/07/24. She had some HHPT and is here today amb with RW.  PAIN:  NPRS scale: 5/10 upon arrival Pain location: Lt knee Pain description: throbbing, aching, cramping Aggravating factors: bending the knee, occasionally walking Relieving factors: medication, ice  PRECAUTIONS:  None  RED FLAGS: None   WEIGHT BEARING RESTRICTIONS:  No  FALLS:  Has patient fallen in last 6 months? Yes. Number of falls 7; has neuropathy  LIVING ENVIRONMENT: Lives with: lives with their spouse and granddaughter (32 y/o) Lives in: House/apartment Stairs: Yes: External: 7 steps; can reach both Has following equipment at home: Single point cane, Walker - 2 wheeled, shower chair, and bed side  commode  OCCUPATION:  Retired - probation officer, CNA and med tech  PLOF:  Independent and Leisure: walking, reading  PATIENT GOALS:  Walk and function like before   OBJECTIVE:  Note: Objective measures were completed at Evaluation unless otherwise noted.   PATIENT SURVEYS:  Patient-Specific Activity Scoring Scheme  0 represents "unable to perform." 10 represents "able to perform at prior level. 0 1 2 3 4 5 6 7 8 9  10 (Date and Score)   Activity Eval  09/19/24   1. Stairs 5 5   2. Walking s walker 5  6  3. Bowling 0 0  Score 3.33 3.6   Total score = sum of the activity scores/number of activities Minimum detectable change (90%CI) for average score = 2 points Minimum detectable change (90%CI) for single activity score = 3 points    COGNITIVE STATUS: 08/23/24 Within functional limits for tasks assessed   SENSATION: 08/23/24 WFL  POSTURE:  08/23/24 rounded shoulders and forward head   GAIT: 08/23/24 Distance walked: 100' Assistive device utilized: Environmental Consultant - 2 wheeled Level of assistance: Modified independence Comments: decreased stance on Lt, narrow BOS, decreased heel strike on Lt   EDEMA: 08/23/24  Ace wrap applied but visible edema and bruising in LLE   LOWER EXTREMITY ROM:     ROM Right Eval 08/23/24 Left Eval 08/23/24 Left 09/05/24 Left 09/19/24  Knee flexion  A: 94 P: 106 A: 95 P: 105 A: 110  Knee extension A: -2 (seated LAQ) A: -6 (seated LAQ) P: -2 A: -5 P: -2 A: -5 P: -2   (Blank rows = not tested)   LOWER EXTREMITY MMT:    08/23/24: Not formally tested; grossly 3-/5  MMT Right eval Left eval Right 09/19/24 Left 09/19/24  Hip flexion      Hip extension      Hip abduction      Hip adduction      Hip internal rotation      Hip external rotation      Knee flexion   24.2# 23.7# 20.2# 17.7#  Knee extension   19.5# 21.2# 18.33 21.9#  Ankle dorsiflexion      Ankle plantarflexion      Ankle inversion      Ankle eversion        (Blank rows = not tested)     FUNCTIONAL TESTS:  08/23/24 5 times sit to stand: 16.5 sec with UE support; unable to rise without UE support  09/19/24; 5 times sit to stand: 14.2 sec with UE support; unable to rise without UE support  TREATMENT 09/19/2024 TherEx: Scifit bike: x 8 minutes Standing TKE green TB x 15 LAQ 3 # 2 x 10 left LE TherActivities:  5 time sit to stand: x 2 times: 14.9 seconds, 14.2 seconds c UE support on each  Step ups on 6 inch step: x 15 c bil UE support Double leg press: 43# 2 x 15 Single leg press: 25# 2 x 10 Modalities:  Vasopenumatic: 34 deg x 10 minutes, left LE elevated on bolster medium compression Manual:  Passive knee flexion, extension with over pressure in supine Seated flexion    09/05/2024 Therex: UBE with bilat LE level 2 for 8 minutes , full fwd revolutions at seat 6 Seated LAQ with 1.5# ankle weight 2x10 with 2-3 sec hold  Seated SLR: 2 x 10  TherActivities: Sit to stand: x 10 c UE support Mini squats x 10 c UE support Standing hip abduction, x 10 bil c UE support Step ups on 4 inch step  x 10 c bil UE support Manual: Seated knee flexion with PT providing IR/distraction x 10,  Passive knee flexion in supine Vaso: Lt knee elevated on wedge with medium compression for 10 minutes at 34deg    08/30/2024 Therex: UBE with bilat LE level 2 for 8 minutes , full fwd revolutions at seat 9 Slant board gastroc stretch 3x30s  Seated LAQ with 1# ankle weight 2x10 with 2-3s hold  Standing TKE 2x10 with 2s hold  Supine heel slides x20 total   Manual: Seated knee flexion with PT providing IR/distraction and slight overpressure into knee flexion 1x10 with 10s hold and PT providing PROM knee extension in between   Vaso: Lt knee elevated on wedge with medium compression for 10 minutes at 34deg   08/23/24 TherEx See HEP - demonstrated with trial reps performed PRN, mod cues for comprehension    Self  Care Educated on PT POC and clinical findings as well as goals of care     PATIENT EDUCATION:  Education details: HEP Person educated: Patient Education method: Programmer, Multimedia, Facilities Manager, and Handouts Education comprehension: verbalized understanding, returned demonstration, and needs further education  HOME EXERCISE PROGRAM: Access Code: [email or web address] URL: [email or web address] Date: 08/23/2024 Prepared by: Corean Ku  Exercises - Supine Heel Slide with Strap  - 5-10 x daily - 7 x weekly - 1 sets - 5-10 reps - Long Sitting Quad Set  - 5-10 x daily - 7 x weekly - 1 sets - 5-10 reps - 5 sec hold - Seated Knee Extension AROM  - 5-10 x daily - 7 x weekly - 1 sets - 5-10 reps - 5 sec hold - Supine Knee Extension Mobilization with Weight  - 3-5 x daily - 7 x weekly - 1 sets - 1 reps - 3-5 min   ASSESSMENT:  CLINICAL IMPRESSION: Pt stating today is a good day. Pt with 2/10 pain in her left knee. Pt with improvements in her 5 time sit to stand. Strength measurements performed for better baseline using HHD.Pt's left knee active ROM is 110 degrees. Recommending continues skilled PT interventions to maximize function.      OBJECTIVE IMPAIRMENTS: Abnormal gait, decreased balance, decreased mobility, difficulty walking, decreased ROM, decreased strength, hypomobility, increased edema, increased fascial restrictions, increased muscle spasms, and pain.   ACTIVITY LIMITATIONS: carrying, lifting, bending, standing, squatting, sleeping, stairs, transfers, and locomotion level  PARTICIPATION LIMITATIONS: meal prep, cleaning, laundry, driving, shopping, and community activity  PERSONAL FACTORS: Age, Past/current experiences, Time since onset of injury/illness/exacerbation, and 3+ comorbidities: HTN, insomnia,  anxiety, depression, OA, glaucoma, neuropathy are also affecting patient's functional outcome.   REHAB POTENTIAL: Good  CLINICAL DECISION MAKING: Evolving/moderate  complexity  EVALUATION COMPLEXITY: Moderate   GOALS: Goals reviewed with patient? Yes  SHORT TERM GOALS: Target date: 09/20/2024   Independent with initial HEP Goal status: MET 09/05/24  2.  Lt knee AROM improved to 0-2-105 for improved function and mobility Goal status: partially met with flexion 09/05/2024   LONG TERM GOALS: Target date: 10/18/2024   Independent with final HEP Goal status: INITIAL  2.  PSFS score improved by 3 points Goal status: INITIAL  3.  Lt knee AROM improved to 0-110 for improved mobility and function Goal status: INIITAL  4.  Report pain < 3/10 with standing and walking activities for improved function Goal status: INITIAL  5.  Demonstrate ability to rise from chair without UE support for improved functional strength Goal status: INITIAL  6.  Amb with LRAD mod I for improved community access and mobility Goal status: INITIAL  7. 5x STS improved to < 14 sec with UE support for improved balance Goal status: INITIAL    PLAN:  PT FREQUENCY: 2x/week  PT DURATION: 8 weeks  PLANNED INTERVENTIONS: 97164- PT Re-evaluation, 97750- Physical Performance Testing, 97110-Therapeutic exercises, 97530- Therapeutic activity, 97112- Neuromuscular re-education, 97535- Self Care, [id number]- Manual therapy, [id number]- Gait training, [phone]- Aquatic Therapy, [phone]- Electrical stimulation (unattended), 97016- Vasopneumatic device, 20560 (1-2 muscles), 20561 (3+ muscles)- Dry Needling, Patient/Family education, Balance training, Stair training, Taping, Joint mobilization, Scar mobilization, DME instructions, Cryotherapy, and Moist heat.  PLAN FOR NEXT SESSION: functional quad strengthening, dynamic balance   NEXT MD VISIT: 09/19/24  Delon Lunger, PT, MPT 09/19/24 3:45 PM   09/19/24 3:45 PM       Date of referral: 08/09/24 Referring provider: Jerri Kay HERO, MD Referring diagnosis? S03.347 (ICD-10-CM) - Status post total left knee replacement M17.12  (ICD-10-CM) - Primary osteoarthritis of left knee Treatment diagnosis? (if different than referring diagnosis) M25.562, M25.662, R26.81, R60.0  What was this (referring dx) caused by? Surgery (Type: Left TKA) and Arthritis  Nature of Condition: Initial Onset (within last 3 months)   Laterality: Lt  Current Functional Measure Score: Patient Specific Functional Scale 3.33  Objective measurements identify impairments when they are compared to normal values, the uninvolved extremity, and prior level of function.  [x]  Yes  []  No  Objective assessment of functional ability: Moderate functional limitations   Briefly describe symptoms: Left TKA - pain, swelling, decreased balance, ROM, strength  How did symptoms start: surgery  Average pain intensity:  Last 24 hours: 5  Past week: 8  How often does the pt experience symptoms? Constantly  How much have the symptoms interfered with usual daily activities? Quite a bit  How has condition changed since care began at this facility? NA - initial visit  In general, how is the patients overall health? Very Good   BACK PAIN (STarT Back Screening Tool) No

## 2024-09-21 ENCOUNTER — Ambulatory Visit: Admitting: Physical Therapy

## 2024-09-21 ENCOUNTER — Encounter: Payer: Self-pay | Admitting: Physical Therapy

## 2024-09-21 DIAGNOSIS — M25562 Pain in left knee: Secondary | ICD-10-CM

## 2024-09-21 DIAGNOSIS — R2681 Unsteadiness on feet: Secondary | ICD-10-CM | POA: Diagnosis not present

## 2024-09-21 DIAGNOSIS — R600 Localized edema: Secondary | ICD-10-CM

## 2024-09-21 DIAGNOSIS — M25662 Stiffness of left knee, not elsewhere classified: Secondary | ICD-10-CM

## 2024-09-21 NOTE — Therapy (Signed)
 OUTPATIENT PHYSICAL THERAPY TREATMENT DISCHARGE SUMMARY   Patient Name: Alyssa Valentine MRN: [id number] DOB:[birth year], 73 y.o., female Today's Date: 09/21/2024  END OF SESSION:  PT End of Session - 09/21/24 1317     Visit Number 5    Number of Visits --    Date for Recertification  --    Authorization Type [employer] $20 copay    Progress Note Due on Visit 10    PT Start Time 1315   pt arrived late   PT Stop Time 1340    PT Time Calculation (min) 25 min    Activity Tolerance Patient tolerated treatment well    Behavior During Therapy WFL for tasks assessed/performed              Past Medical History:  Diagnosis Date   Anemia    many years ago   Anxiety    Arthritis    Depression    GERD (gastroesophageal reflux disease)    Glaucoma    Headache    hx of migraines, no longer have them   HTN (hypertension)    Past Surgical History:  Procedure Laterality Date   BACK SURGERY  2017   CARPAL TUNNEL RELEASE Right 07/17/1989   CARPAL TUNNEL RELEASE Left 01/04/2016   Procedure: CARPAL TUNNEL RELEASE;  Surgeon: Elsie Mussel, MD;  Location: [facility];  Service: Orthopedics;  Laterality: Left;   CESAREAN SECTION  11/17/1983   COLONOSCOPY N/A 09/15/2013   Procedure: COLONOSCOPY;  Surgeon: Belvie JONETTA Just, MD;  Location: [facility];  Service: Endoscopy;  Laterality: N/A;   ESOPHAGOGASTRODUODENOSCOPY N/A 09/15/2013   Procedure: ESOPHAGOGASTRODUODENOSCOPY (EGD);  Surgeon: Belvie JONETTA Just, MD;  Location: [facility];  Service: Endoscopy;  Laterality: N/A;   ORIF RADIAL FRACTURE Left 01/04/2016   Procedure: OPEN REDUCTION INTERNAL FIXATION (ORIF) LEFT RADIAL FRACTURE;  Surgeon: Elsie Mussel, MD;  Location: [facility];  Service: Orthopedics;  Laterality: Left;   ROTATOR CUFF REPAIR Bilateral 2010 and 2013   TOTAL KNEE ARTHROPLASTY Left 08/07/2024   Procedure: ARTHROPLASTY, KNEE, TOTAL;  Surgeon: Jerri Kay HERO, MD;  Location: [facility];  Service: Orthopedics;  Laterality: Left;    Patient Active Problem List   Diagnosis Date Noted   Status post total left knee replacement 08/07/2024   Primary osteoarthritis of left knee 08/06/2024   Primary osteoarthritis of right hip 06/20/2024   Degenerative scoliosis in adult patient 05/28/2022   Spondylolisthesis of lumbar region 08/26/2016   Radius distal fracture 01/04/2016   Tinea unguium 02/23/2014   Insomnia 02/23/2014   Other and unspecified hyperlipidemia 02/23/2014   HTN (hypertension)     PCP: Verdia Lombard, MD  REFERRING PROVIDER: Jerri Kay HERO, MD  REFERRING DIAG: [phone] (ICD-10-CM) - Status post total left knee replacement M17.12 (ICD-10-CM) - Primary osteoarthritis of left knee  Rationale for Evaluation and Treatment: Rehabilitation  THERAPY DIAG:  Acute pain of left knee  Stiffness of left knee, not elsewhere classified  Unsteadiness on feet  Localized edema  ONSET DATE: 08/07/24   SUBJECTIVE:  SUBJECTIVE STATEMENT: Denies pain in Lt knee, feels like she wants to d/c today.  Needs Rt THA and TKA which impact her ability to amb without device  PERTINENT HISTORY:  HTN, insomnia, anxiety, depression, OA, glaucoma  Pt is s/p Lt TKA on 08/07/24. She had some HHPT and is here today amb with RW.  PAIN:  NPRS scale: 0/10 upon arrival Pain location: Lt knee Pain description: throbbing, aching, cramping Aggravating factors: bending the knee, occasionally walking Relieving factors: medication, ice  PRECAUTIONS:  None  RED FLAGS: None   WEIGHT BEARING RESTRICTIONS:  No  FALLS:  Has patient fallen in last 6 months? Yes. Number of falls 7; has neuropathy  LIVING ENVIRONMENT: Lives with: lives with their spouse and granddaughter (32 y/o) Lives in: House/apartment Stairs: Yes: External: 7 steps; can  reach both Has following equipment at home: Single point cane, Walker - 2 wheeled, shower chair, and bed side commode  OCCUPATION:  Retired - probation officer, CNA and med tech  PLOF:  Independent and Leisure: walking, reading  PATIENT GOALS:  Walk and function like before   OBJECTIVE:  Note: Objective measures were completed at Evaluation unless otherwise noted.   PATIENT SURVEYS:  Patient-Specific Activity Scoring Scheme  0 represents "unable to perform." 10 represents "able to perform at prior level. 0 1 2 3 4 5 6 7 8 9  10 (Date and Score)   Activity Eval  09/19/24  09/21/24  1. Stairs 5 5  10   2. Walking s walker 5  6 10   3. Bowling 0 0 10  Score 3.33 3.6 10   Total score = sum of the activity scores/number of activities Minimum detectable change (90%CI) for average score = 2 points Minimum detectable change (90%CI) for single activity score = 3 points    COGNITIVE STATUS: 08/23/24 Within functional limits for tasks assessed   SENSATION: 08/23/24 WFL  POSTURE:  08/23/24 rounded shoulders and forward head   GAIT: 08/23/24 Distance walked: 100' Assistive device utilized: Environmental Consultant - 2 wheeled Level of assistance: Modified independence Comments: decreased stance on Lt, narrow BOS, decreased heel strike on Lt   EDEMA: 08/23/24  Ace wrap applied but visible edema and bruising in LLE   LOWER EXTREMITY ROM:     ROM Right Eval 08/23/24 Left Eval 08/23/24 Left 09/05/24 Left 09/19/24 Left 09/21/24  Knee flexion  A: 94 P: 106 A: 95 P: 105 A: 110 A: 110  Knee extension A: -2 (seated LAQ) A: -6 (seated LAQ) P: -2 A: -5 P: -2 A: -5 P: -2 A: -1 (seated LAQ)   (Blank rows = not tested)   LOWER EXTREMITY MMT:    08/23/24: Not formally tested; grossly 3-/5  MMT Right eval Left eval Right 09/19/24 Left 09/19/24  Knee flexion   24.2# 23.7# 20.2# 17.7#  Knee extension   19.5# 21.2# 18.33 21.9#   (Blank rows = not tested)     FUNCTIONAL TESTS:   08/23/24 5 times sit to stand: 16.5 sec with UE support; unable to rise without UE support  09/19/24; 5 times sit to stand: 14.2 sec with UE support; unable to rise without UE support  09/21/24: able to rise from chair 1 time without UE support 5x STS: 10.93 sec with UE support                    TREATMENT 09/21/24 TherEx: NuStep L6 x 8 min Objective measurements - see above for details  TherAct Bridges x 10 reps;  5 sec hold Sit to/from stand from elevated surface x 10 reps; no UE support Standing hip abduction x 10 reps bil; L2 band Standing hip extension x 10 reps bil; L2 band    09/19/2024 TherEx: Scifit bike: x 8 minutes Standing TKE green TB x 15 LAQ 3 # 2 x 10 left LE TherActivities:  5 time sit to stand: x 2 times: 14.9 seconds, 14.2 seconds c UE support on each  Step ups on 6 inch step: x 15 c bil UE support Double leg press: 43# 2 x 15 Single leg press: 25# 2 x 10 Modalities:  Vasopenumatic: 34 deg x 10 minutes, left LE elevated on bolster medium compression Manual:  Passive knee flexion, extension with over pressure in supine Seated flexion    09/05/2024 Therex: UBE with bilat LE level 2 for 8 minutes , full fwd revolutions at seat 6 Seated LAQ with 1.5# ankle weight 2x10 with 2-3 sec hold  Seated SLR: 2 x 10  TherActivities: Sit to stand: x 10 c UE support Mini squats x 10 c UE support Standing hip abduction, x 10 bil c UE support Step ups on 4 inch step  x 10 c bil UE support Manual: Seated knee flexion with PT providing IR/distraction x 10,  Passive knee flexion in supine Vaso: Lt knee elevated on wedge with medium compression for 10 minutes at 34deg    08/30/2024 Therex: UBE with bilat LE level 2 for 8 minutes , full fwd revolutions at seat 9 Slant board gastroc stretch 3x30s  Seated LAQ with 1# ankle weight 2x10 with 2-3s hold  Standing TKE 2x10 with 2s hold  Supine heel slides x20 total   Manual: Seated knee flexion with PT providing  IR/distraction and slight overpressure into knee flexion 1x10 with 10s hold and PT providing PROM knee extension in between   Vaso: Lt knee elevated on wedge with medium compression for 10 minutes at 34deg   08/23/24 TherEx See HEP - demonstrated with trial reps performed PRN, mod cues for comprehension    Self Care Educated on PT POC and clinical findings as well as goals of care     PATIENT EDUCATION:  Education details: HEP Person educated: Patient Education method: Programmer, Multimedia, Facilities Manager, and Handouts Education comprehension: verbalized understanding, returned demonstration, and needs further education  HOME EXERCISE PROGRAM: Access Code: [email or web address] URL: [email or web address] Date: 09/21/2024 Prepared by: Corean Ku  Exercises - Supine Heel Slide with Strap  - 5-10 x daily - 7 x weekly - 1 sets - 5-10 reps - Long Sitting Quad Set  - 5-10 x daily - 7 x weekly - 1 sets - 5-10 reps - 5 sec hold - Seated Knee Extension AROM  - 5-10 x daily - 7 x weekly - 1 sets - 5-10 reps - 5 sec hold - Supine Knee Extension Mobilization with Weight  - 3-5 x daily - 7 x weekly - 1 sets - 1 reps - 3-5 min - Supine Bridge  - 2 x daily - 7 x weekly - 1 sets - 10 reps - 5 sec hold - Sit to Stand  - 2 x daily - 7 x weekly - 1 sets - 10 reps - Standing Hip Extension with Resistance at Ankles and Counter Support  - 1 x daily - 7 x weekly - 1 sets - 10 reps - Standing Hip Abduction with Resistance at Ankles and Counter Support  - 1 x daily - 7 x weekly - 1 sets - 10  reps   ASSESSMENT:  CLINICAL IMPRESSION: Pt has met most LTGs at this time and is pleased with her progress.  She still needs to amb with AD due to advanced OA in Rt knee and hip which she is anticipating upcoming surgeries.  Will d/c PT today.  OBJECTIVE IMPAIRMENTS: Abnormal gait, decreased balance, decreased mobility, difficulty walking, decreased ROM, decreased strength, hypomobility, increased edema,  increased fascial restrictions, increased muscle spasms, and pain.   ACTIVITY LIMITATIONS: carrying, lifting, bending, standing, squatting, sleeping, stairs, transfers, and locomotion level  PARTICIPATION LIMITATIONS: meal prep, cleaning, laundry, driving, shopping, and community activity  PERSONAL FACTORS: Age, Past/current experiences, Time since onset of injury/illness/exacerbation, and 3+ comorbidities: HTN, insomnia, anxiety, depression, OA, glaucoma, neuropathy are also affecting patient's functional outcome.   REHAB POTENTIAL: Good  CLINICAL DECISION MAKING: Evolving/moderate complexity  EVALUATION COMPLEXITY: Moderate   GOALS: Goals reviewed with patient? Yes  SHORT TERM GOALS: Target date: 09/20/2024   Independent with initial HEP Goal status: MET 09/05/24  2.  Lt knee AROM improved to 0-2-105 for improved function and mobility Goal status: MET 09/21/24   LONG TERM GOALS: Target date: 10/18/2024   Independent with final HEP Goal status: MET 09/21/24  2.  PSFS score improved by 3 points Goal status: MET 09/21/24  3.  Lt knee AROM improved to 0-110 for improved mobility and function Goal status: PARTIALLY MET 09/21/24  4.  Report pain < 3/10 with standing and walking activities for improved function Goal status: MET 09/21/24  5.  Demonstrate ability to rise from chair without UE support for improved functional strength Goal status: MET 09/21/24  6.  Amb with LRAD mod I for improved community access and mobility Goal status: NOT MET (due to Rt hip and knee OA, not due to recent TKA) 09/21/24  7. 5x STS improved to < 14 sec with UE support for improved balance Goal status: MET 09/21/24    PLAN:  PT FREQUENCY: 2x/week  PT DURATION: 8 weeks  PLANNED INTERVENTIONS: 97164- PT Re-evaluation, 97750- Physical Performance Testing, 97110-Therapeutic exercises, 97530- Therapeutic activity, 97112- Neuromuscular re-education, 97535- Self Care, 02859- Manual therapy, [phone]-  Gait training, [phone]- Aquatic Therapy, [phone]- Electrical stimulation (unattended), 97016- Vasopneumatic device, 20560 (1-2 muscles), 20561 (3+ muscles)- Dry Needling, Patient/Family education, Balance training, Stair training, Taping, Joint mobilization, Scar mobilization, DME instructions, Cryotherapy, and Moist heat.  PLAN FOR NEXT SESSION: d/c PT   NEXT MD VISIT: 09/19/24    Corean JULIANNA Ku, PT, DPT 09/21/24 1:43 PM    PHYSICAL THERAPY DISCHARGE SUMMARY  Visits from Start of Care: 5  Current functional level related to goals / functional outcomes: See above   Remaining deficits: See above   Education / Equipment: HEP   Patient agrees to discharge. Patient goals were partially met. Patient is being discharged due to being pleased with the current functional level.   Corean JULIANNA Ku, PT, DPT 09/21/24 1:43 PM  [facility] [address] [place], [place], [address] Phone: [phone]   Fax:  [phone]

## 2024-10-31 ENCOUNTER — Ambulatory Visit: Admitting: Orthopaedic Surgery

## 2024-10-31 DIAGNOSIS — M79604 Pain in right leg: Secondary | ICD-10-CM

## 2024-10-31 NOTE — Progress Notes (Signed)
 Office Visit Note   Patient: Alyssa Valentine           Date of Birth: 1951/02/08           MRN: [id number] Visit Date: 10/31/2024              Requested by: Verdia Lombard, MD [address] [place],  [place] [address] PCP: Verdia Lombard, MD   Assessment & Plan: Visit Diagnoses:  1. Pain in right leg     Plan: History of Present Illness Alyssa Valentine is a 73 year old female who presents for a three-month postoperative visit following a left total knee replacement and evaluation for right hip pain.  Three months after left total knee replacement, she is satisfied with the result, has completed physical therapy, and feels functionally ready for additional procedures.  She has severe, persistent right hip pain for about two years, described as a knot with groin pain that occurs with standing, sitting, and lying down, and can be intense enough to cause nausea and vomiting. Two or three ultrasound-guided cortisone injections to the hip gave no relief.  She is on Humira. Hip replacement was previously considered but delayed in favor of the more symptomatic knee. She has had extensive back surgery in the past, which may be contributing to her current symptoms.  Currently, hip rotation is not painful. The pain is mainly in the trochanteric region and groin.  Physical Exam MUSCULOSKELETAL: Right hip rotation and flexion not painful. Tenderness on palpation of right hip trochanteric area.  Assessment and Plan Right trochanteric bursitis Chronic right hip pain localized to the trochanteric area, possibly referred from back surgery. Previous intra-articular cortisone injections ineffective.  She states she did not receive relief even during anesthetic phase.  This concerns me about the efficacy of a hip replacement. - Referred to Dr. Burnetta for ultrasound-guided trochanteric injection to assess pain relief and confirm diagnosis. - Consider MRI of the hip if injection  provides no relief.  Status post left total knee arthroplasty - Doing very well.  Activity as tolerated.  Recheck in 3 months.  Follow-Up Instructions: No follow-ups on file.   Orders:  Orders Placed This Encounter  Procedures   AMB referral to sports medicine   No orders of the defined types were placed in this encounter.     Procedures: No procedures performed   Clinical Data: No additional findings.   Subjective: Chief Complaint  Patient presents with   Left Knee - Follow-up    Left TKA 08/07/2024    HPI  Review of Systems  Constitutional: Negative.   HENT: Negative.    Eyes: Negative.   Respiratory: Negative.    Cardiovascular: Negative.   Endocrine: Negative.   Musculoskeletal: Negative.   Neurological: Negative.   Hematological: Negative.   Psychiatric/Behavioral: Negative.    All other systems reviewed and are negative.    Objective: Vital Signs: There were no vitals taken for this visit.  Physical Exam Vitals and nursing note reviewed.  Constitutional:      Appearance: She is well-developed.  HENT:     Head: Atraumatic.     Nose: Nose normal.  Eyes:     Extraocular Movements: Extraocular movements intact.  Cardiovascular:     Pulses: Normal pulses.  Pulmonary:     Effort: Pulmonary effort is normal.  Abdominal:     Palpations: Abdomen is soft.  Musculoskeletal:     Cervical back: Neck supple.  Skin:    General:  Skin is warm.     Capillary Refill: Capillary refill takes less than 2 seconds.  Neurological:     Mental Status: She is alert. Mental status is at baseline.  Psychiatric:        Behavior: Behavior normal.        Thought Content: Thought content normal.        Judgment: Judgment normal.     Ortho Exam  Specialty Comments:  No specialty comments available.  Imaging: No results found.   PMFS History: Patient Active Problem List   Diagnosis Date Noted   Status post total left knee replacement 08/07/2024   Primary  osteoarthritis of left knee 08/06/2024   Primary osteoarthritis of right hip 06/20/2024   Degenerative scoliosis in adult patient 05/28/2022   Spondylolisthesis of lumbar region 08/26/2016   Radius distal fracture 01/04/2016   Tinea unguium 02/23/2014   Insomnia 02/23/2014   Other and unspecified hyperlipidemia 02/23/2014   HTN (hypertension)    Past Medical History:  Diagnosis Date   Anemia    many years ago   Anxiety    Arthritis    Depression    GERD (gastroesophageal reflux disease)    Glaucoma    Headache    hx of migraines, no longer have them   HTN (hypertension)     No family history on file.  Past Surgical History:  Procedure Laterality Date   BACK SURGERY  2017   CARPAL TUNNEL RELEASE Right 07/17/1989   CARPAL TUNNEL RELEASE Left 01/04/2016   Procedure: CARPAL TUNNEL RELEASE;  Surgeon: Elsie Mussel, MD;  Location: [facility];  Service: Orthopedics;  Laterality: Left;   CESAREAN SECTION  11/17/1983   COLONOSCOPY N/A 09/15/2013   Procedure: COLONOSCOPY;  Surgeon: Belvie JONETTA Just, MD;  Location: [facility];  Service: Endoscopy;  Laterality: N/A;   ESOPHAGOGASTRODUODENOSCOPY N/A 09/15/2013   Procedure: ESOPHAGOGASTRODUODENOSCOPY (EGD);  Surgeon: Belvie JONETTA Just, MD;  Location: [facility];  Service: Endoscopy;  Laterality: N/A;   ORIF RADIAL FRACTURE Left 01/04/2016   Procedure: OPEN REDUCTION INTERNAL FIXATION (ORIF) LEFT RADIAL FRACTURE;  Surgeon: Elsie Mussel, MD;  Location: [facility];  Service: Orthopedics;  Laterality: Left;   ROTATOR CUFF REPAIR Bilateral 2010 and 2013   TOTAL KNEE ARTHROPLASTY Left 08/07/2024   Procedure: ARTHROPLASTY, KNEE, TOTAL;  Surgeon: Jerri Kay HERO, MD;  Location: [facility];  Service: Orthopedics;  Laterality: Left;   Social History   Occupational History   Not on file  Tobacco Use   Smoking status: Never   Smokeless tobacco: Never  Vaping Use   Vaping status: Never Used  Substance and Sexual Activity   Alcohol use: Yes    Comment:  occasional   Drug use: No   Sexual activity: Not on file

## 2024-11-10 ENCOUNTER — Other Ambulatory Visit: Payer: Self-pay | Admitting: Physician Assistant

## 2024-11-23 ENCOUNTER — Ambulatory Visit: Admitting: Sports Medicine

## 2024-11-23 ENCOUNTER — Other Ambulatory Visit: Payer: Self-pay

## 2024-11-23 ENCOUNTER — Encounter: Payer: Self-pay | Admitting: Sports Medicine

## 2024-11-23 DIAGNOSIS — M79604 Pain in right leg: Secondary | ICD-10-CM

## 2024-11-23 DIAGNOSIS — M25551 Pain in right hip: Secondary | ICD-10-CM

## 2024-11-23 MED ORDER — LIDOCAINE HCL 1 % IJ SOLN
2.0000 mL | INTRAMUSCULAR | Status: AC | PRN
  Administered 2024-11-23: 2 mL

## 2024-11-23 MED ORDER — BETAMETHASONE SOD PHOS & ACET 6 (3-3) MG/ML IJ SUSP
6.0000 mg | INTRAMUSCULAR | Status: AC | PRN
  Administered 2024-11-23: 6 mg via INTRA_ARTICULAR

## 2024-11-23 MED ORDER — BUPIVACAINE HCL 0.25 % IJ SOLN
2.0000 mL | INTRAMUSCULAR | Status: AC | PRN
  Administered 2024-11-23: 2 mL via INTRA_ARTICULAR

## 2024-11-23 NOTE — Progress Notes (Addendum)
" ° °  Procedure Note  Patient: Alyssa Valentine             Date of Birth: 1951-06-22           MRN: [id number]             Visit Date: 11/23/2024  Procedures: Visit Diagnoses:  1. Greater trochanteric pain syndrome of right lower extremity   2. Pain in right leg    Large Joint Inj: R greater trochanter on 11/23/2024 1:47 PM Indications: pain Details: 22 G 3.5 in needle, ultrasound-guided lateral approach Medications: 2 mL lidocaine  1 %; 2 mL bupivacaine  0.25 %; 6 mg betamethasone  acetate-betamethasone  sodium phosphate  6 (3-3) MG/ML Outcome: tolerated well, no immediate complications  US -Guided Greater Trochanteric Bursa Injection, Right After discussion on risks/benefits/indications and informed verbal consent was obtained, a timeout was performed. The patient was lying in lateral recumbent position on exam table. Using ultrasound guidance, the greater trochanter was identified. The area overlying the trochanteric bursa was then prepped with Betadine  and alcohol swabs . Following sterile precautions, ultrasound was reapplied to visualize needle guidance with a 22-gauge 3.5 needle utilizing an in-plane approach to inject the bursa with 4:1 lidocaine :bupivicaine:betamethasone . Delivery of the injectate was visualized into the region of hypoechoic fluid of the greater trochanteric bursa. Patient tolerated procedure well without immediate complications.    Procedure, treatment alternatives, risks and benefits explained, specific risks discussed. Consent was given by the patient. Immediately prior to procedure a time out was called to verify the correct patient, procedure, equipment, support staff and site/side marked as required. Patient was prepped and draped in the usual sterile fashion.       - patient tolerated procedure well, discussed post-injection protocol - This was a nondiagnostic scan, although limited evaluation did show likely high-grade partial tearing of the gluteus minimus >  medius - follow-up with Dr. Jerri as indicated; I am happy to see them as needed  Lonell Sprang, DO Primary Care Sports Medicine Physician  [facility] - Orthopedics  This note was dictated using Dragon naturally speaking software and may contain errors in syntax, spelling, or content which have not been identified prior to signing this note.      "

## 2024-11-30 ENCOUNTER — Ambulatory Visit: Admitting: Physician Assistant

## 2024-11-30 ENCOUNTER — Other Ambulatory Visit (INDEPENDENT_AMBULATORY_CARE_PROVIDER_SITE_OTHER): Payer: Self-pay

## 2024-11-30 DIAGNOSIS — M25551 Pain in right hip: Secondary | ICD-10-CM

## 2024-11-30 DIAGNOSIS — Z96652 Presence of left artificial knee joint: Secondary | ICD-10-CM | POA: Diagnosis not present

## 2024-11-30 NOTE — Addendum Note (Signed)
 Addended by: JACKSON LEADER on: 11/30/2024 11:31 AM   Modules accepted: Orders

## 2024-11-30 NOTE — Progress Notes (Signed)
 "  Office Visit Note   Patient: Alyssa Valentine           Date of Birth: October 28, 1951           MRN: [id number] Visit Date: 11/30/2024              Requested by: Verdia Lombard, MD [address] [place],  [place] [address] PCP: Verdia Lombard, MD   Assessment & Plan: Visit Diagnoses:  1. H/O total knee replacement, left   2. Pain in right hip     Plan: Impression is chronic right hip pain and 4 months status post left total knee replacement with recent fall.  Regards to the hip, she is tried intra-articular cortisone injections as well as cortisone injection to the greater troches without relief.  According to Dr. Benjiman notes, he would like an MRI of the most recent injection failed to provide any relief.  Will go ahead and order this and she will follow-up with Dr. Jerri once this has been completed.  In regards to the knee, he will to continue to advance with activity as tolerated.  Follow-up in 2 months when she is 6 months out from surgery for repeat evaluation and x-rays.  Call with concerns or questions.  Follow-Up Instructions: Return if symptoms worsen or fail to improve.   Orders:  Orders Placed This Encounter  Procedures   XR Knee 1-2 Views Left   No orders of the defined types were placed in this encounter.     Procedures: No procedures performed   Clinical Data: No additional findings.   Subjective: Chief Complaint  Patient presents with   Right Hip - Follow-up    HPI patient is a pleasant 74 year old female who comes in today with right hip pain as well as concerns about her left knee.  Regards to her hip, she has had pain not only in the groin but to the lateral hip.  She is undergone multiple intra-articular cortisone injections without relief.  Most recently, she underwent right greater troches injection which was only 1 week ago.  She denies any relief following the anesthetic phase.  She continues to have pain to both areas worse with  activity.  Currently ambulating with a rollator.  She also brings up left knee pain after sustaining a fall recently.  The pain she has the lateral aspect.  Pain seems to be worse with ambulation.  Of note, she is status post left total knee replacement 08/07/2024.  Review of Systems as detailed in HPI.  All others reviewed and are negative.   Objective: Vital Signs: There were no vitals taken for this visit.  Physical Exam well-developed well-nourished female in no acute distress.  Alert and oriented x 3.  Ortho Exam right hip exam: Tenderness with palpation to the greater troches.  Pain with logroll and FADIR testing.  Left knee exam: No effusion.  Range of motion 0 to 120 degrees.  Mild tenderness to the lateral joint line.  She is stable to valgus varus stress.  She is neurovascular intact distally.  Specialty Comments:  No specialty comments available.  Imaging: XR Knee 1-2 Views Left Result Date: 11/30/2024 Well-seated prosthesis without complication    PMFS History: Patient Active Problem List   Diagnosis Date Noted   Status post total left knee replacement 08/07/2024   Primary osteoarthritis of left knee 08/06/2024   Primary osteoarthritis of right hip 06/20/2024   Degenerative scoliosis in adult patient 05/28/2022   Spondylolisthesis of  lumbar region 08/26/2016   Radius distal fracture 01/04/2016   Tinea unguium 02/23/2014   Insomnia 02/23/2014   Other and unspecified hyperlipidemia 02/23/2014   HTN (hypertension)    Past Medical History:  Diagnosis Date   Anemia    many years ago   Anxiety    Arthritis    Depression    GERD (gastroesophageal reflux disease)    Glaucoma    Headache    hx of migraines, no longer have them   HTN (hypertension)     No family history on file.  Past Surgical History:  Procedure Laterality Date   BACK SURGERY  2017   CARPAL TUNNEL RELEASE Right 07/17/1989   CARPAL TUNNEL RELEASE Left 01/04/2016   Procedure: CARPAL TUNNEL  RELEASE;  Surgeon: Elsie Mussel, MD;  Location: [facility];  Service: Orthopedics;  Laterality: Left;   CESAREAN SECTION  11/17/1983   COLONOSCOPY N/A 09/15/2013   Procedure: COLONOSCOPY;  Surgeon: Belvie JONETTA Just, MD;  Location: [facility];  Service: Endoscopy;  Laterality: N/A;   ESOPHAGOGASTRODUODENOSCOPY N/A 09/15/2013   Procedure: ESOPHAGOGASTRODUODENOSCOPY (EGD);  Surgeon: Belvie JONETTA Just, MD;  Location: [facility];  Service: Endoscopy;  Laterality: N/A;   ORIF RADIAL FRACTURE Left 01/04/2016   Procedure: OPEN REDUCTION INTERNAL FIXATION (ORIF) LEFT RADIAL FRACTURE;  Surgeon: Elsie Mussel, MD;  Location: [facility];  Service: Orthopedics;  Laterality: Left;   ROTATOR CUFF REPAIR Bilateral 2010 and 2013   TOTAL KNEE ARTHROPLASTY Left 08/07/2024   Procedure: ARTHROPLASTY, KNEE, TOTAL;  Surgeon: Jerri Kay HERO, MD;  Location: [facility];  Service: Orthopedics;  Laterality: Left;   Social History   Occupational History   Not on file  Tobacco Use   Smoking status: Never   Smokeless tobacco: Never  Vaping Use   Vaping status: Never Used  Substance and Sexual Activity   Alcohol use: Yes    Comment: occasional   Drug use: No   Sexual activity: Not on file        "

## 2024-12-01 ENCOUNTER — Inpatient Hospital Stay: Admission: RE | Admit: 2024-12-01

## 2024-12-01 DIAGNOSIS — M25551 Pain in right hip: Secondary | ICD-10-CM

## 2024-12-03 ENCOUNTER — Ambulatory Visit: Payer: Self-pay | Admitting: Orthopaedic Surgery

## 2024-12-03 NOTE — Progress Notes (Signed)
 Needs f/u appt

## 2024-12-04 ENCOUNTER — Telehealth: Payer: Self-pay

## 2024-12-04 NOTE — Telephone Encounter (Signed)
 Tried to call patient. No answer. LMOM for patient to call back for scheduling appt with Dr.Xu to review MRI.

## 2024-12-04 NOTE — Progress Notes (Signed)
 Tried to call. No answer. LMOM for patient to call back for scheduling.

## 2024-12-08 ENCOUNTER — Other Ambulatory Visit: Payer: Self-pay | Admitting: Physician Assistant

## 2024-12-08 ENCOUNTER — Telehealth: Payer: Self-pay | Admitting: Orthopaedic Surgery

## 2024-12-08 MED ORDER — TRAMADOL HCL 50 MG PO TABS: 50.0000 mg | ORAL_TABLET | Freq: Two times a day (BID) | ORAL | 0 refills | Status: AC | PRN

## 2024-12-08 NOTE — Telephone Encounter (Signed)
 Pt called saying that she is in a lot of pain and that her hip has gotten worse.she wants something for pain. Pharmacy is [facility] on [address]. Call back number is [phone]

## 2024-12-08 NOTE — Telephone Encounter (Signed)
Sent tramadol to pharmacy on file

## 2024-12-14 ENCOUNTER — Ambulatory Visit: Admitting: Orthopaedic Surgery

## 2024-12-14 ENCOUNTER — Other Ambulatory Visit (INDEPENDENT_AMBULATORY_CARE_PROVIDER_SITE_OTHER): Payer: Self-pay

## 2024-12-14 DIAGNOSIS — M25511 Pain in right shoulder: Secondary | ICD-10-CM | POA: Diagnosis not present

## 2024-12-14 DIAGNOSIS — M1611 Unilateral primary osteoarthritis, right hip: Secondary | ICD-10-CM | POA: Diagnosis not present

## 2024-12-14 DIAGNOSIS — M25512 Pain in left shoulder: Secondary | ICD-10-CM

## 2024-12-14 DIAGNOSIS — G8929 Other chronic pain: Secondary | ICD-10-CM

## 2024-12-14 NOTE — Progress Notes (Signed)
 "  Office Visit Note   Patient: Alyssa Valentine           Date of Birth: 1951-07-25           MRN: [id number] Visit Date: 12/14/2024              Requested by: Alyssa Lombard, MD [address] [place]lyssa Valentine,  [place] [address] PCP: Alyssa Lombard, MD   Assessment & Plan: Visit Diagnoses:  1. Primary osteoarthritis of right hip   2. Chronic pain of both shoulders     Plan: Impression is symptomatic right hip osteoarthritis.  Degenerative tendinosis to the abductors but prior injections have not been effective.  Will send her to Dr. Burnetta for intra-articular steroid injection.  She will let me know how well the injection works.  In regards to the shoulders she has bilateral rotator cuff arthropathy worse on the right.  She is status post rotator cuff repair in the past.  Treatment options given and we will also send her to Dr. Burnetta for bilateral shoulder injections.  I will place a referral for physical therapy for the shoulders.  Follow-Up Instructions: Return if symptoms worsen or fail to improve.   Orders:  Orders Placed This Encounter  Procedures   XR Shoulder Left   XR Shoulder Right   Ambulatory referral to Physical Therapy   AMB referral to sports medicine   No orders of the defined types were placed in this encounter.     Procedures: No procedures performed   Clinical Data: No additional findings.   Subjective: Chief Complaint  Patient presents with   Right Hip - Follow-up    MRI review    HPI Alyssa Valentine returns today for MRI review of the right hip and evaluation of bilateral shoulder pain.  In regards to the right hip she felt no relief from a greater trochanter injection by Dr. Burnetta.  She reports deep hip and groin pain.  In regards to the shoulders she has pain with elevation of the arm above 70 degrees.  She has status post rotator cuff surgery many years ago. Review of Systems  Constitutional: Negative.   HENT: Negative.    Eyes:  Negative.   Respiratory: Negative.    Cardiovascular: Negative.   Endocrine: Negative.   Musculoskeletal: Negative.   Neurological: Negative.   Hematological: Negative.   Psychiatric/Behavioral: Negative.    All other systems reviewed and are negative.    Objective: Vital Signs: There were no vitals taken for this visit.  Physical Exam Vitals and nursing note reviewed.  Constitutional:      Appearance: She is well-developed.  HENT:     Head: Atraumatic.     Nose: Nose normal.  Eyes:     Extraocular Movements: Extraocular movements intact.  Cardiovascular:     Pulses: Normal pulses.  Pulmonary:     Effort: Pulmonary effort is normal.  Abdominal:     Palpations: Abdomen is soft.  Musculoskeletal:     Cervical back: Neck supple.  Skin:    General: Skin is warm.     Capillary Refill: Capillary refill takes less than 2 seconds.  Neurological:     Mental Status: She is alert. Mental status is at baseline.  Psychiatric:        Behavior: Behavior normal.        Thought Content: Thought content normal.        Judgment: Judgment normal.     Ortho Exam Right hip exam is unchanged  from prior visit.  Bilateral shoulder exams show pain and difficulty with abduction and forward flexion above shoulder level. Specialty Comments:  No specialty comments available.  Imaging: XR Shoulder Right Result Date: 12/14/2024 X-rays of the right shoulder show joint space narrowing and subchondral cystic formation  XR Shoulder Left Result Date: 12/14/2024 X-rays of the left shoulder demonstrate significant joint space narrowing and subchondral cystic formation.    PMFS History: Patient Active Problem List   Diagnosis Date Noted   Status post total left knee replacement 08/07/2024   Primary osteoarthritis of left knee 08/06/2024   Primary osteoarthritis of right hip 06/20/2024   Degenerative scoliosis in adult patient 05/28/2022   Spondylolisthesis of lumbar region 08/26/2016    Radius distal fracture 01/04/2016   Tinea unguium 02/23/2014   Insomnia 02/23/2014   Other and unspecified hyperlipidemia 02/23/2014   HTN (hypertension)    Past Medical History:  Diagnosis Date   Anemia    many years ago   Anxiety    Arthritis    Depression    GERD (gastroesophageal reflux disease)    Glaucoma    Headache    hx of migraines, no longer have them   HTN (hypertension)     No family history on file.  Past Surgical History:  Procedure Laterality Date   BACK SURGERY  2017   CARPAL TUNNEL RELEASE Right 07/17/1989   CARPAL TUNNEL RELEASE Left 01/04/2016   Procedure: CARPAL TUNNEL RELEASE;  Surgeon: Elsie Mussel, MD;  Location: [facility];  Service: Orthopedics;  Laterality: Left;   CESAREAN SECTION  11/17/1983   COLONOSCOPY N/A 09/15/2013   Procedure: COLONOSCOPY;  Surgeon: Belvie JONETTA Just, MD;  Location: [facility];  Service: Endoscopy;  Laterality: N/A;   ESOPHAGOGASTRODUODENOSCOPY N/A 09/15/2013   Procedure: ESOPHAGOGASTRODUODENOSCOPY (EGD);  Surgeon: Belvie JONETTA Just, MD;  Location: [facility];  Service: Endoscopy;  Laterality: N/A;   ORIF RADIAL FRACTURE Left 01/04/2016   Procedure: OPEN REDUCTION INTERNAL FIXATION (ORIF) LEFT RADIAL FRACTURE;  Surgeon: Elsie Mussel, MD;  Location: [facility];  Service: Orthopedics;  Laterality: Left;   ROTATOR CUFF REPAIR Bilateral 2010 and 2013   TOTAL KNEE ARTHROPLASTY Left 08/07/2024   Procedure: ARTHROPLASTY, KNEE, TOTAL;  Surgeon: Jerri Kay HERO, MD;  Location: [facility];  Service: Orthopedics;  Laterality: Left;   Social History   Occupational History   Not on file  Tobacco Use   Smoking status: Never   Smokeless tobacco: Never  Vaping Use   Vaping status: Never Used  Substance and Sexual Activity   Alcohol use: Yes    Comment: occasional   Drug use: No   Sexual activity: Not on file        "

## 2024-12-25 ENCOUNTER — Encounter: Admitting: Sports Medicine

## 2025-01-01 ENCOUNTER — Ambulatory Visit: Admitting: Rehabilitative and Restorative Service Providers"

## 2025-01-11 ENCOUNTER — Ambulatory Visit: Admitting: Internal Medicine
